# Patient Record
Sex: Female | Born: 1935
Health system: Southern US, Community
[De-identification: ages and names within clinical notes are randomized; demographics above are authoritative.]

## PROBLEM LIST (undated history)

## (undated) DIAGNOSIS — M1990 Unspecified osteoarthritis, unspecified site: Secondary | ICD-10-CM

## (undated) DIAGNOSIS — I10 Essential (primary) hypertension: Secondary | ICD-10-CM

## (undated) DIAGNOSIS — E079 Disorder of thyroid, unspecified: Secondary | ICD-10-CM

## (undated) DIAGNOSIS — I639 Cerebral infarction, unspecified: Secondary | ICD-10-CM

## (undated) DIAGNOSIS — N183 Chronic kidney disease, stage 3 unspecified: Secondary | ICD-10-CM

## (undated) DIAGNOSIS — Z9289 Personal history of other medical treatment: Secondary | ICD-10-CM

## (undated) HISTORY — DX: Essential (primary) hypertension: I10

## (undated) HISTORY — DX: Disorder of thyroid, unspecified: E07.9

## (undated) HISTORY — PX: REFRACTIVE SURGERY: SHX103

## (undated) HISTORY — DX: Personal history of other medical treatment: Z92.89

## (undated) HISTORY — PX: FINGER SURGERY: SHX640

## (undated) HISTORY — DX: Unspecified osteoarthritis, unspecified site: M19.90

---

## 1998-04-19 ENCOUNTER — Other Ambulatory Visit: Admission: RE | Admit: 1998-04-19 | Discharge: 1998-04-19 | Payer: Self-pay | Admitting: Gynecology

## 1999-05-21 ENCOUNTER — Other Ambulatory Visit: Admission: RE | Admit: 1999-05-21 | Discharge: 1999-05-21 | Payer: Self-pay | Admitting: Gynecology

## 2000-07-02 ENCOUNTER — Other Ambulatory Visit: Admission: RE | Admit: 2000-07-02 | Discharge: 2000-07-02 | Payer: Self-pay | Admitting: Gynecology

## 2001-08-10 ENCOUNTER — Other Ambulatory Visit: Admission: RE | Admit: 2001-08-10 | Discharge: 2001-08-10 | Payer: Self-pay | Admitting: Gynecology

## 2002-02-14 ENCOUNTER — Other Ambulatory Visit: Admission: RE | Admit: 2002-02-14 | Discharge: 2002-02-14 | Payer: Self-pay | Admitting: Gynecology

## 2002-11-01 ENCOUNTER — Other Ambulatory Visit: Admission: RE | Admit: 2002-11-01 | Discharge: 2002-11-01 | Payer: Self-pay | Admitting: Gynecology

## 2002-11-02 ENCOUNTER — Other Ambulatory Visit: Admission: RE | Admit: 2002-11-02 | Discharge: 2002-11-02 | Payer: Self-pay | Admitting: Gynecology

## 2003-05-08 ENCOUNTER — Other Ambulatory Visit: Admission: RE | Admit: 2003-05-08 | Discharge: 2003-05-08 | Payer: Self-pay | Admitting: Gynecology

## 2004-01-19 ENCOUNTER — Other Ambulatory Visit: Admission: RE | Admit: 2004-01-19 | Discharge: 2004-01-19 | Payer: Self-pay | Admitting: Gynecology

## 2005-04-21 ENCOUNTER — Other Ambulatory Visit: Admission: RE | Admit: 2005-04-21 | Discharge: 2005-04-21 | Payer: Self-pay | Admitting: Gynecology

## 2006-02-02 ENCOUNTER — Encounter: Admission: RE | Admit: 2006-02-02 | Discharge: 2006-02-02 | Payer: Self-pay | Admitting: Family Medicine

## 2006-04-15 ENCOUNTER — Encounter: Admission: RE | Admit: 2006-04-15 | Discharge: 2006-04-15 | Payer: Self-pay | Admitting: Endocrinology

## 2007-05-18 ENCOUNTER — Ambulatory Visit (HOSPITAL_COMMUNITY): Admission: RE | Admit: 2007-05-18 | Discharge: 2007-05-18 | Payer: Self-pay | Admitting: Gynecology

## 2007-06-28 ENCOUNTER — Encounter: Payer: Self-pay | Admitting: Family Medicine

## 2008-03-30 ENCOUNTER — Ambulatory Visit: Payer: Self-pay | Admitting: Ophthalmology

## 2008-03-30 ENCOUNTER — Other Ambulatory Visit: Payer: Self-pay

## 2008-04-05 ENCOUNTER — Ambulatory Visit: Payer: Self-pay | Admitting: Ophthalmology

## 2008-05-23 ENCOUNTER — Ambulatory Visit: Payer: Self-pay | Admitting: Ophthalmology

## 2008-06-07 ENCOUNTER — Ambulatory Visit: Payer: Self-pay | Admitting: Ophthalmology

## 2008-10-09 ENCOUNTER — Ambulatory Visit: Payer: Self-pay | Admitting: Family Medicine

## 2008-10-09 DIAGNOSIS — G4700 Insomnia, unspecified: Secondary | ICD-10-CM | POA: Insufficient documentation

## 2008-10-09 DIAGNOSIS — E039 Hypothyroidism, unspecified: Secondary | ICD-10-CM | POA: Insufficient documentation

## 2008-10-09 DIAGNOSIS — I10 Essential (primary) hypertension: Secondary | ICD-10-CM | POA: Insufficient documentation

## 2009-04-18 ENCOUNTER — Telehealth: Payer: Self-pay | Admitting: Family Medicine

## 2009-10-08 ENCOUNTER — Ambulatory Visit: Payer: Self-pay | Admitting: Specialist

## 2009-10-15 ENCOUNTER — Ambulatory Visit: Payer: Self-pay | Admitting: Specialist

## 2009-10-23 ENCOUNTER — Ambulatory Visit: Payer: Self-pay | Admitting: Specialist

## 2009-10-23 ENCOUNTER — Encounter: Payer: Self-pay | Admitting: Family Medicine

## 2009-11-14 ENCOUNTER — Telehealth: Payer: Self-pay | Admitting: Family Medicine

## 2009-11-22 ENCOUNTER — Ambulatory Visit: Payer: Self-pay | Admitting: Family Medicine

## 2009-11-22 DIAGNOSIS — R5383 Other fatigue: Secondary | ICD-10-CM | POA: Insufficient documentation

## 2009-11-22 DIAGNOSIS — R5381 Other malaise: Secondary | ICD-10-CM | POA: Insufficient documentation

## 2009-12-20 ENCOUNTER — Ambulatory Visit: Payer: Self-pay | Admitting: Family Medicine

## 2010-03-27 ENCOUNTER — Ambulatory Visit: Payer: Self-pay | Admitting: Family Medicine

## 2010-08-29 NOTE — Progress Notes (Signed)
Summary: Pt req partial refill of Amlodipine. Has ov on 11/22/09  Phone Note Call from Patient Call back at [phone] cell  or [phone]   Caller: Patient Summary of Call: Pt has sch an ov for 11/22/09 at 11:00 am.  Pt is req a partial refill of Amlodipine to last until appt. Pt only has 2 pills lft. [facility] [address]  [phone]. Initial call taken by: Braulio Bosch,  November 14, 2009 11:18 AM  Follow-up for Phone Call        Pt informed #30 sent to pharmacy Follow-up by: Nira Conn LPN,  April 20, 624THL 1:51 PM    Prescriptions: AMLODIPINE BESYLATE 5 MG TABS (AMLODIPINE BESYLATE) one by mouth once daily  #30 x 0   Entered by:   Nira Conn LPN   Authorized by:   Carolann Littler MD   Signed by:   Nira Conn LPN on [id number]   Method used:   [facility]        [facility]       [address]       [place], [place]  [address]       Ph: [phone] or [phone]       Fax: [phone]   RxID:   [id number]

## 2010-08-29 NOTE — Assessment & Plan Note (Signed)
Summary: ROA/RCD   Vital Signs:  Patient profile:   75 year old female Menstrual status:  postmenopausal Height:      61.5 inches Weight:      146 pounds BMI:     27.24 Temp:     98.3 degrees F oral BP sitting:   142 / 82  (left arm) Cuff size:   regular  Vitals Entered By: Nira Conn LPN (March 15, 624THL 579FGE PM) Is Patient Diabetic? No  years   days  Menstrual Status postmenopausal   History of Present Illness: Patient is 75 year old female seen to establish care. She has history of hypertension which is treated with hctz 12.5 mg daily and amlodipine 5 mg daily. She also has history of hypothyroidism treated with Synthroid she sees an endocrinologist for that. She has osteoporosis treated with Fosamax.  She has a history of mild osteoarthritis but remains quite active physically. She needs refills of her amlodipine today. She's not had any recent chest pain, shortness of breath, dizziness, or syncopal episodes.  She complains of some difficulty with sleeping generally 4-5 hours per night. No daytime naps. No recent decongestant use and no alcohol use. She does drink a lot of tea sometimes as late as 5 or 6 PM at night. She had some associated urine frequency and urgency he was placed on oxybutynin by her GYN but had excessive dry mouth and stopped this.  Preventive Screening-Counseling & Management     Smoking Status: never  Allergies (verified): No Known Drug Allergies  Past History:  Past Medical History:    Hypertension    Arthritis    Blood transfusion 1960  Family History:    Family History of Arthritis  parent    Family History Hypertension  parent    Family History of Stroke F 1st degree relative <60  parent    Family History of Cardiovascular disorder  parent father 34  Social History:    Retired Optometrist, now Horticulturist, commercial    Married    Never Smoked    Alcohol use-no    Smoking Status:  never  Review of Systems  The patient denies weight  loss, weight gain, chest pain, syncope, dyspnea on exertion, peripheral edema, and prolonged cough.    Physical Exam  General:  Alert pleasant cooperative 75 year old female Head:  Normocephalic and atraumatic without obvious abnormalities. No apparent alopecia or balding. Mouth:  Oral mucosa and oropharynx without lesions or exudates.  Teeth in good repair. Neck:  no masses or adenopathy noted Lungs:  chest clear to auscultation Heart:  heart regular rhythm and rate Extremities:  no edema or clubbing noted   Impression & Recommendations:  Problem # 1:  HYPERTENSION (ICD-401.9) Stable by home readings. Refill on amlodipine for one year. Routine followup in 6 months. Her updated medication list for this problem includes:    Hydrochlorothiazide 25 Mg Tabs (Hydrochlorothiazide) .Marland Kitchen... 1/2 tab daily    Amlodipine Besylate 5 Mg Tabs (Amlodipine besylate) ..... Once daily    Amlodipine Besylate 5 Mg Tabs (Amlodipine besylate) ..... One by mouth once daily  Problem # 2:  INSOMNIA-SLEEP DISORDER-UNSPEC (ICD-780.52) Issues of sleep hygiene were discussed extensively. She should scale her caffeine back no later than 2 PM. Avoid daytime naps.  Problem # 3:  HYPOTHYROIDISM (ICD-244.9)  Her updated medication list for this problem includes:    Synthroid 112 Mcg Tabs (Levothyroxine sodium) ..... Once daily  Complete Medication List: 1)  Synthroid 112 Mcg Tabs (Levothyroxine sodium) .Marland KitchenMarland KitchenMarland Kitchen  Once daily 2)  Alendronate Sodium 70 Mg Tabs (Alendronate sodium) .... Once daily 3)  Hydrochlorothiazide 25 Mg Tabs (Hydrochlorothiazide) .... 1/2 tab daily 4)  Amlodipine Besylate 5 Mg Tabs (Amlodipine besylate) .... Once daily 5)  Amlodipine Besylate 5 Mg Tabs (Amlodipine besylate) .... One by mouth once daily  Patient Instructions: 1)  Please schedule a follow-up appointment in 6 months .  2)  Limit your Sodium(salt) to less than 2 grams a day (slightly less than 1/2 teaspoon) to prevent fluid retention,  swelling, or worsening of symptoms .  3)  The medication list was reviewed and reconciled.  All changed / newly prescribed medications were explained.  A complete medication list was provided to the patient / caregiver. Prescriptions: AMLODIPINE BESYLATE 5 MG TABS (AMLODIPINE BESYLATE) one by mouth once daily  #30 x 11   Entered and Authorized by:   Carolann Littler MD   Signed by:   Carolann Littler MD on 10/09/2008   Method used:   [facility]        [id number]       [address]       [place], [place]  [address]       Ph: [phone] or [phone]       Fax: [phone]   RxID:   [id number]

## 2010-08-29 NOTE — Assessment & Plan Note (Signed)
Summary: 1 month rov/njr   Vital Signs:  Patient profile:   75 year old female Menstrual status:  postmenopausal Temp:     98 degrees F BP sitting:   158 / 80  (left arm) Cuff size:   regular  Vitals Entered By: Nira Conn LPN (May 26, 624THL [id number] PM)  Serial Vital Signs/Assessments:  Time      Position  BP       Pulse  Resp  Temp     By                     146/78                         Carolann Littler MD  CC: One month follow-up   History of Present Illness: F/U hypertension. Question of whitecoat syndrome. Home blood pressures reviewed and consistently good readings. Denies any orthostatic symptoms, chest pains, or palpitations.  Allergies: No Known Drug Allergies  Past History:  Past Medical History: Last updated: 10/09/2008 Hypertension Arthritis Blood transfusion 1960  Review of Systems  The patient denies weight loss, weight gain, chest pain, syncope, dyspnea on exertion, peripheral edema, prolonged cough, and headaches.    Physical Exam  General:  Well-developed,well-nourished,in no acute distress; alert,appropriate and cooperative throughout examination Neck:  No deformities, masses, or tenderness noted. Lungs:  Normal respiratory effort, chest expands symmetrically. Lungs are clear to auscultation, no crackles or wheezes. Heart:  Normal rate and regular rhythm. S1 and S2 normal without gallop, murmur, click, rub or other extra sounds.   Impression & Recommendations:  Problem # 1:  HYPERTENSION (ICD-401.9) home cuff readings here do not match with ours and she will get new home cuff. cont current meds and reassess 3 months. Her updated medication list for this problem includes:    Hydrochlorothiazide 25 Mg Tabs (Hydrochlorothiazide) .Marland Kitchen... 1/2 tab daily    Amlodipine Besylate 5 Mg Tabs (Amlodipine besylate) ..... One by mouth once daily  Complete Medication List: 1)  Synthroid 100 Mcg Tabs (Levothyroxine sodium) .... Once daily 2)  Alendronate Sodium  70 Mg Tabs (Alendronate sodium) .... Once daily 3)  Hydrochlorothiazide 25 Mg Tabs (Hydrochlorothiazide) .... 1/2 tab daily 4)  Amlodipine Besylate 5 Mg Tabs (Amlodipine besylate) .... One by mouth once daily 5)  Aspirin Ec Low Strength 81 Mg Tbec (Aspirin) .... Once daily  Patient Instructions: 1)  Please schedule a follow-up appointment in 3 months .  2)  Check your  Blood Pressure regularly . If it is above: 140/90  you should make an appointment. 3)  Limit your Sodium(salt) .

## 2010-08-29 NOTE — Assessment & Plan Note (Signed)
Summary: med check/refill/cjr/pt rsc/cjr   Vital Signs:  Patient profile:   75 year old female Menstrual status:  postmenopausal Height:      61.50 inches Weight:      147.50 pounds BMI:     27.52 Temp:     98.7 degrees F oral Pulse rate:   72 / minute Pulse rhythm:   regular Resp:     12 per minute BP sitting:   150 / 80  (left arm) Cuff size:   regular  Vitals Entered By: Nira Conn LPN (April 28, 624THL 624THL AM)  Serial Vital Signs/Assessments:  Time      Position  BP       Pulse  Resp  Temp     By                     168/80                         Carolann Littler MD  CC: med check, refills needed, Hypertension Management   History of Present Illness: followup hypertension. Medications reviewed. Compliant with therapy. Not monitoring blood pressure regularly at home.  Hypothyroidism followed by endocrinologist.  History of osteoporosis. Takes Fosamax regularly. Continues to see GYN regarding that.  Complains of some nonspecific fatigue which she thinks might be related to stress of care for her debilitated husband.  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  Sleep OK.  Denies depression.  Recent TSH was normal.   Eats freq "on the run" with several processed foods.  Hypertension History:      She denies headache, chest pain, palpitations, orthopnea, PND, peripheral edema, visual symptoms, neurologic problems, syncope, and side effects from treatment.  She notes no problems with any antihypertensive medication side effects.  Further comments include: Occ mild dyspnea with exertion but no chest pain.        Positive major cardiovascular risk factors include female age 73 years old or older and hypertension.  Negative major cardiovascular risk factors include non-tobacco-user status.     Allergies (verified): No Known Drug Allergies  Past History:  Past Medical History: Last updated: 10/09/2008 Hypertension Arthritis Blood  transfusion 1960  Social History: Last updated: 10/09/2008 Retired Optometrist, now self employed-taxes Married Never Smoked Alcohol use-no  Review of Systems       The patient complains of weight gain.  The patient denies anorexia, fever, weight loss, chest pain, syncope, peripheral edema, prolonged cough, headaches, hemoptysis, abdominal pain, melena, hematochezia, severe indigestion/heartburn, muscle weakness, and depression.    Physical Exam  General:  Well-developed,well-nourished,in no acute distress; alert,appropriate and cooperative throughout examination Head:  Normocephalic and atraumatic without obvious abnormalities. No apparent alopecia or balding. Eyes:  pupils equal, pupils round, and pupils reactive to light.   Ears:  External ear exam shows no significant lesions or deformities.  Otoscopic examination reveals clear canals, tympanic membranes are intact bilaterally without bulging, retraction, inflammation or discharge. Hearing is grossly normal bilaterally. Mouth:  Oral mucosa and oropharynx without lesions or exudates.  Teeth in good repair. Neck:  No deformities, masses, or tenderness noted. Lungs:  Normal respiratory effort, chest expands symmetrically. Lungs are clear to auscultation, no crackles or wheezes. Heart:  normal rate, regular rhythm, and no gallop.   Extremities:  no edema Neurologic:  alert & oriented X3, cranial nerves II-XII intact, and strength normal in all extremities.   Cervical Nodes:  No  lymphadenopathy noted Psych:  Oriented X3, normally interactive, good eye contact, not anxious appearing, and not depressed appearing.     Impression & Recommendations:  Problem # 1:  HYPERTENSION (ICD-401.9) Assessment Deteriorated Recheck within 3-4 weeks and pt to bring her home cuff with log of readings. The following medications were removed from the medication list:    Amlodipine Besylate 5 Mg Tabs (Amlodipine besylate) ..... Once daily Her updated  medication list for this problem includes:    Hydrochlorothiazide 25 Mg Tabs (Hydrochlorothiazide) .Marland Kitchen... 1/2 tab daily    Amlodipine Besylate 5 Mg Tabs (Amlodipine besylate) ..... One by mouth once daily  Orders: Prescription Created Electronically [phone])  Problem # 2:  HYPOTHYROIDISM (ICD-244.9)  Her updated medication list for this problem includes:    Synthroid 100 Mcg Tabs (Levothyroxine sodium) ..... Once daily  Problem # 3:  FATIGUE (ICD-780.79) Assessment: New prob related to stress.  Dietary issues discussed.  Complete Medication List: 1)  Synthroid 100 Mcg Tabs (Levothyroxine sodium) .... Once daily 2)  Alendronate Sodium 70 Mg Tabs (Alendronate sodium) .... Once daily 3)  Hydrochlorothiazide 25 Mg Tabs (Hydrochlorothiazide) .... 1/2 tab daily 4)  Amlodipine Besylate 5 Mg Tabs (Amlodipine besylate) .... One by mouth once daily 5)  Aspirin Ec Low Strength 81 Mg Tbec (Aspirin) .... Once daily  Hypertension Assessment/Plan:      The patient's hypertensive risk group is category B: At least one risk factor (excluding diabetes) with no target organ damage.  Today's blood pressure is 150/80.    Patient Instructions: 1)  Please schedule a follow-up appointment in 1 month.  2)  Limit your Sodium(salt) .  3)  Bring home blood pressure monitor with you when you return for follow up. Prescriptions: AMLODIPINE BESYLATE 5 MG TABS (AMLODIPINE BESYLATE) one by mouth once daily  #30 x 6   Entered and Authorized by:   Carolann Littler MD   Signed by:   Carolann Littler MD on 11/22/2009   Method used:   [facility]        [id number]       [address]       [place], [place]  [address]       Ph: [phone] or [phone]       Fax: [phone]   RxID:   [id number] HYDROCHLOROTHIAZIDE 25 MG TABS (HYDROCHLOROTHIAZIDE) 1/2 tab daily  #30 x 6   Entered and Authorized by:   Carolann Littler MD   Signed by:   Carolann Littler MD on 11/22/2009   Method used:    [facility]        [id number]       [address]       [place], [place]  [address]       Ph: [phone] or [phone]       Fax: [phone]   RxID:   [id number]

## 2010-08-29 NOTE — Assessment & Plan Note (Signed)
Summary: 3 month follow up/cjr/pt rsc/cjr   Vital Signs:  Patient profile:   75 year old female Menstrual status:  postmenopausal Weight:      147 pounds Temp:     98 degrees F oral BP sitting:   138 / 92  (left arm) Cuff size:   regular  Vitals Entered By: Nira Conn LPN (August 31, 624THL 3:21 PM)  History of Present Illness: Patient's for followup hypertension. Blood pressure readings have been stable recently at home. Compliant with medications. No side effects. Patient has obtained new blood pressure monitor since last visit.  she remains quite active with physical activities around her house. 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  thyroid monitored by endocrinologist  Hypertension History:      She denies headache, chest pain, palpitations, dyspnea with exertion, orthopnea, PND, peripheral edema, visual symptoms, neurologic problems, syncope, and side effects from treatment.  She notes no problems with any antihypertensive medication side effects.        Positive major cardiovascular risk factors include female age 32 years old or older and hypertension.  Negative major cardiovascular risk factors include non-tobacco-user status.     Allergies (verified): No Known Drug Allergies  Past History:  Past Medical History: Last updated: 10/09/2008 Hypertension Arthritis Blood transfusion 1960 PMH reviewed for relevance  Review of Systems      See HPI  Physical Exam  General:  Well-developed,well-nourished,in no acute distress; alert,appropriate and cooperative throughout examination Mouth:  Oral mucosa and oropharynx without lesions or exudates.  Teeth in good repair. Neck:  No deformities, masses, or tenderness noted. Lungs:  Normal respiratory effort, chest expands symmetrically. Lungs are clear to auscultation, no crackles or wheezes. Heart:  Normal rate and regular rhythm. S1 and S2 normal without gallop, murmur, click, rub or  other extra sounds.   Impression & Recommendations:  Problem # 1:  HYPERTENSION (ICD-401.9) Assessment Unchanged adequate control by home readings Her updated medication list for this problem includes:    Hydrochlorothiazide 25 Mg Tabs (Hydrochlorothiazide) .Marland Kitchen... 1/2 tab daily    Amlodipine Besylate 5 Mg Tabs (Amlodipine besylate) ..... One by mouth once daily  Complete Medication List: 1)  Synthroid 100 Mcg Tabs (Levothyroxine sodium) .... Once daily 2)  Alendronate Sodium 70 Mg Tabs (Alendronate sodium) .... Once daily 3)  Hydrochlorothiazide 25 Mg Tabs (Hydrochlorothiazide) .... 1/2 tab daily 4)  Amlodipine Besylate 5 Mg Tabs (Amlodipine besylate) .... One by mouth once daily 5)  Aspirin Ec Low Strength 81 Mg Tbec (Aspirin) .... Once daily  Hypertension Assessment/Plan:      The patient's hypertensive risk group is category B: At least one risk factor (excluding diabetes) with no target organ damage.  Today's blood pressure is 138/92.    Patient Instructions: 1)  Check your  Blood Pressure regularly . If it is above:140/90  you should make an appointment. 2)  Please schedule a follow-up appointment in 6 months .

## 2010-08-29 NOTE — Letter (Signed)
Summary: Records from [facility] 2007 - 2008  Records from [facility] 2007 - 2008   Imported By: Laural Benes 11/13/2008 15:14:12  _____________________________________________________________________  External Attachment:    Type:   Image     Comment:   External Document

## 2010-08-29 NOTE — Progress Notes (Signed)
Summary: Vein Referral Order Request  Phone Note From Other Clinic   Caller: Patient Caller: [facility] [place] Call For: Paula Haynes Summary of Call: [name] from [facility] [place] calling on behalf of pt requesting a referral.  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 Her insurance has changed and new insurance requires a referral  for treatment.   Fax order/referral to [id number] Phone is [phone] Initial call taken by: Nira Conn LPN,  September 22, 624THL 10:16 AM  Follow-up for Phone Call        order written. Follow-up by: Carolann Littler MD,  April 18, 2009 10:39 AM  Additional Follow-up for Phone Call Additional follow up Details #1::        Rx faxed, confirmation received Additional Follow-up by: Nira Conn LPN,  September 22, 624THL 11:24 AM

## 2010-09-07 ENCOUNTER — Other Ambulatory Visit: Payer: Self-pay | Admitting: Family Medicine

## 2010-09-07 DIAGNOSIS — I10 Essential (primary) hypertension: Secondary | ICD-10-CM

## 2010-10-15 ENCOUNTER — Encounter: Payer: Self-pay | Admitting: Family Medicine

## 2010-10-16 ENCOUNTER — Ambulatory Visit (INDEPENDENT_AMBULATORY_CARE_PROVIDER_SITE_OTHER): Payer: PRIVATE HEALTH INSURANCE | Admitting: Family Medicine

## 2010-10-16 ENCOUNTER — Encounter: Payer: Self-pay | Admitting: Family Medicine

## 2010-10-16 VITALS — BP 140/82 | Temp 98.3°F | Ht 61.25 in | Wt 147.0 lb

## 2010-10-16 DIAGNOSIS — R5381 Other malaise: Secondary | ICD-10-CM

## 2010-10-16 DIAGNOSIS — I10 Essential (primary) hypertension: Secondary | ICD-10-CM

## 2010-10-16 DIAGNOSIS — R5383 Other fatigue: Secondary | ICD-10-CM

## 2010-10-16 MED ORDER — HYDROCHLOROTHIAZIDE 12.5 MG PO CAPS: 12.5000 mg | ORAL_CAPSULE | ORAL | Status: DC

## 2010-10-16 NOTE — Progress Notes (Signed)
  Subjective:    Patient ID: Paula Haynes, female    DOB: [birth year], 75 y.o.   MRN: [id number]  HPI  Here for follow up hypertension.  Meds reviewed.  Compliant with all.  No side effects.  Very busy caring for demented husband.  Increased stress levels.  No chest pain or dizziness.  Hypothyroidism followed by endocrinologist.  No weight changes.  Review of Systems  Constitutional: Negative for fatigue.  Eyes: Negative for visual disturbance.  Respiratory: Negative for cough, chest tightness, shortness of breath and wheezing.   Cardiovascular: Negative for chest pain, palpitations and leg swelling.  Neurological: Negative for dizziness, seizures, syncope, weakness, light-headedness and headaches.       Objective:   Physical Exam  Constitutional: She is oriented to person, place, and time. She appears well-developed and well-nourished.  HENT:  Head: Normocephalic and atraumatic.  Eyes: Conjunctivae are normal. Pupils are equal, round, and reactive to light.  Neck: Neck supple. No thyromegaly present.  Cardiovascular: Normal rate and regular rhythm.  Exam reveals no gallop.   Pulmonary/Chest: Effort normal and breath sounds normal. No respiratory distress. She has no rales.  Musculoskeletal: She exhibits no edema.  Lymphadenopathy:    She has no cervical adenopathy.  Neurological: She is alert and oriented to person, place, and time. No cranial nerve deficit.  Skin: No rash noted.  Psychiatric: She has a normal mood and affect.          Assessment & Plan:  Hypertension.  Stable.  Refilled meds.  Routine follow up in 6 months.

## 2010-12-04 ENCOUNTER — Other Ambulatory Visit: Payer: Self-pay | Admitting: Family Medicine

## 2011-04-25 ENCOUNTER — Other Ambulatory Visit: Payer: Self-pay | Admitting: Family Medicine

## 2011-05-02 ENCOUNTER — Ambulatory Visit (INDEPENDENT_AMBULATORY_CARE_PROVIDER_SITE_OTHER): Payer: PRIVATE HEALTH INSURANCE | Admitting: Family Medicine

## 2011-05-02 DIAGNOSIS — Z23 Encounter for immunization: Secondary | ICD-10-CM

## 2011-05-22 ENCOUNTER — Other Ambulatory Visit: Payer: Self-pay | Admitting: Family Medicine

## 2011-07-05 ENCOUNTER — Other Ambulatory Visit: Payer: Self-pay | Admitting: Family Medicine

## 2011-11-03 ENCOUNTER — Other Ambulatory Visit: Payer: Self-pay | Admitting: Family Medicine

## 2011-11-09 ENCOUNTER — Other Ambulatory Visit: Payer: Self-pay | Admitting: Family Medicine

## 2011-12-03 ENCOUNTER — Other Ambulatory Visit: Payer: Self-pay | Admitting: Family Medicine

## 2011-12-31 ENCOUNTER — Other Ambulatory Visit: Payer: Self-pay | Admitting: Family Medicine

## 2012-01-02 ENCOUNTER — Ambulatory Visit: Payer: PRIVATE HEALTH INSURANCE | Admitting: Family Medicine

## 2012-01-02 ENCOUNTER — Other Ambulatory Visit: Payer: Self-pay | Admitting: *Deleted

## 2012-01-02 MED ORDER — AMLODIPINE BESYLATE 5 MG PO TABS: 5.0000 mg | ORAL_TABLET | Freq: Every day | ORAL | Status: DC

## 2012-01-02 MED ORDER — HYDROCHLOROTHIAZIDE 12.5 MG PO CAPS: 12.5000 mg | ORAL_CAPSULE | Freq: Every day | ORAL | Status: DC

## 2012-01-22 ENCOUNTER — Encounter: Payer: Self-pay | Admitting: Family Medicine

## 2012-01-22 ENCOUNTER — Ambulatory Visit (INDEPENDENT_AMBULATORY_CARE_PROVIDER_SITE_OTHER): Payer: Medicare Other | Admitting: Family Medicine

## 2012-01-22 VITALS — BP 140/78 | HR 72 | Temp 98.0°F | Resp 12 | Ht 61.5 in | Wt 140.0 lb

## 2012-01-22 DIAGNOSIS — Z0000 Encounter for general adult medical examination without abnormal findings: Secondary | ICD-10-CM

## 2012-01-22 DIAGNOSIS — I10 Essential (primary) hypertension: Secondary | ICD-10-CM

## 2012-01-22 DIAGNOSIS — Z23 Encounter for immunization: Secondary | ICD-10-CM

## 2012-01-22 DIAGNOSIS — E039 Hypothyroidism, unspecified: Secondary | ICD-10-CM

## 2012-01-22 LAB — BASIC METABOLIC PANEL
BUN: 29 mg/dL — ABNORMAL HIGH (ref 6–23)
CO2: 28 mEq/L (ref 19–32)
Calcium: 9.7 mg/dL (ref 8.4–10.5)
Chloride: 103 mEq/L (ref 96–112)
Creatinine, Ser: 1.4 mg/dL — ABNORMAL HIGH (ref 0.4–1.2)
GFR: 39.34 mL/min — ABNORMAL LOW (ref >60.00)
Glucose, Bld: 91 mg/dL (ref 70–99)
Potassium: 3.9 mEq/L (ref 3.5–5.1)
Sodium: 139 mEq/L (ref 135–145)

## 2012-01-22 MED ORDER — ZOSTER VACCINE LIVE 19400 UNT/0.65ML ~~LOC~~ SOLR: 0.6500 mL | Freq: Once | SUBCUTANEOUS | Status: DC

## 2012-01-22 NOTE — Patient Instructions (Addendum)
We will call you with an appointment for a colonoscopy Continue yearly flu vaccine. Continue regular supplementation with vitamin D and calcium. Continue yearly mammograms

## 2012-01-22 NOTE — Progress Notes (Signed)
Subjective:    Patient ID: Paula Haynes, female    DOB: [birth year], 76 y.o.   MRN: [id number]  HPI  Patient here for Medicare wellness exam and medical followup. Her husband has weight recently following stroke. Overall she is coping fairly well. She has several supportive friends stays busy with several activities. She recently had [employer] screening but no copy of final report yet. No reported history of shingles vaccine. No known Pneumovax. No history of colonoscopy and she would like to consider scheduling. She gets yearly flu vaccine.  Chronic problems include hypothyroidism followed by endocrinology. She has hypertension treated with amlodipine and HCTZ.  Nonsmoker. No alcohol use. Low risk for falls. No major depressive issues.  Past Medical History  Diagnosis Date  . Hypertension   . Arthritis    No past surgical history on file.  reports that she has never smoked. She does not have any smokeless tobacco history on file. She reports that she does not drink alcohol. Her drug history not on file. family history includes Arthritis in an unspecified family member; Heart disease in her father; Hypertension in an unspecified family member; and Stroke in an unspecified family member. No Known Allergies  1.  Risk factors based on Past Medical , Social, and Family history  Reviewed and as above. 2.  Limitations in physical activities very active.  Low risk of falls. 3.  Depression/mood no depression issues. 4.  Hearing no impairment. 5.  ADLs fully independent. 6.  Cognitive function (orientation to time and place, language, writing, speech,memory) No memory deficits.  Language and judgement intact. 7.  Home Safety no issues 8.  Height, weight, and visual acuity. All stable. 9.  Counseling discussed coping with loss of spouse.   10. Recommendation of preventive services. Pneumovax. Set up colonoscopy.  Check on shingles vaccine coverage. 11. Labs based on risk factors BMP (diuretic  use). 12. Care Plan As above.    Review of Systems  Constitutional: Negative for fever, activity change, appetite change, fatigue and unexpected weight change.  HENT: Negative for hearing loss, ear pain, sore throat and trouble swallowing.   Eyes: Negative for visual disturbance.  Respiratory: Negative for cough and shortness of breath.   Cardiovascular: Negative for chest pain and palpitations.  Gastrointestinal: Negative for abdominal pain, diarrhea, constipation and blood in stool.  Genitourinary: Negative for dysuria and hematuria.  Musculoskeletal: Negative for myalgias, back pain and arthralgias.  Skin: Negative for rash.  Neurological: Negative for dizziness, syncope and headaches.  Hematological: Negative for adenopathy.  Psychiatric/Behavioral: Negative for confusion and dysphoric mood.       Objective:   Physical Exam  Constitutional: She is oriented to person, place, and time. She appears well-developed and well-nourished.  HENT:  Head: Normocephalic and atraumatic.  Eyes: EOM are normal. Pupils are equal, round, and reactive to light.  Neck: Normal range of motion. Neck supple. No thyromegaly present.  Cardiovascular: Normal rate, regular rhythm and normal heart sounds.   No murmur heard. Pulmonary/Chest: Breath sounds normal. No respiratory distress. She has no wheezes. She has no rales.  Abdominal: Soft. Bowel sounds are normal. She exhibits no distension and no mass. There is no tenderness. There is no rebound and no guarding.  Genitourinary:       Per GYN  Musculoskeletal: Normal range of motion. She exhibits no edema.  Lymphadenopathy:    She has no cervical adenopathy.  Neurological: She is alert and oriented to person, place, and time. She displays normal reflexes.  No cranial nerve deficit.  Skin: No rash noted.  Psychiatric: She has a normal mood and affect. Her behavior is normal. Judgment and thought content normal.          Assessment & Plan:    #1complete physical. Pneumovax given. Continue yearly flu vaccine. Check on coverage shingles vaccine. Set up colonoscopy. Continue regular GYN followup and mammograms. She reportedly had recent bone density scan through GYN #2 hypertension. Stable. Continue current medications. Check basic metabolic panel #3 hypothyroidism. Followed by endocrinology.

## 2012-01-23 NOTE — Progress Notes (Signed)
Quick Note:  Pt informed on VM ______ 

## 2012-02-02 ENCOUNTER — Encounter: Payer: Self-pay | Admitting: Internal Medicine

## 2012-02-10 ENCOUNTER — Ambulatory Visit (AMBULATORY_SURGERY_CENTER): Payer: Medicare Other

## 2012-02-10 VITALS — Ht 62.0 in | Wt 139.8 lb

## 2012-02-10 DIAGNOSIS — Z1211 Encounter for screening for malignant neoplasm of colon: Secondary | ICD-10-CM

## 2012-02-10 MED ORDER — MOVIPREP 100 G PO SOLR: ORAL | Status: DC

## 2012-02-11 ENCOUNTER — Encounter: Payer: Self-pay | Admitting: Internal Medicine

## 2012-02-24 ENCOUNTER — Encounter: Payer: Self-pay | Admitting: Internal Medicine

## 2012-02-24 ENCOUNTER — Other Ambulatory Visit: Payer: Self-pay | Admitting: Family Medicine

## 2012-02-24 ENCOUNTER — Ambulatory Visit (AMBULATORY_SURGERY_CENTER): Payer: Medicare Other | Admitting: Internal Medicine

## 2012-02-24 VITALS — BP 146/86 | HR 88 | Temp 96.2°F | Resp 20 | Ht 62.0 in | Wt 139.0 lb

## 2012-02-24 DIAGNOSIS — K5730 Diverticulosis of large intestine without perforation or abscess without bleeding: Secondary | ICD-10-CM

## 2012-02-24 DIAGNOSIS — Z1211 Encounter for screening for malignant neoplasm of colon: Secondary | ICD-10-CM

## 2012-02-24 MED ORDER — SODIUM CHLORIDE 0.9 % IV SOLN: 500.0000 mL | INTRAVENOUS | Status: DC

## 2012-02-24 NOTE — Progress Notes (Signed)
Patient did not experience any of the following events: a burn prior to discharge; a fall within the facility; wrong site/side/patient/procedure/implant event; or a hospital transfer or hospital admission upon discharge from the facility. (G8907) Patient did not have preoperative order for IV antibiotic SSI prophylaxis. (G8918)  

## 2012-02-24 NOTE — Progress Notes (Signed)
Propofol given and oxygen managed per S [place] CRNA 

## 2012-02-24 NOTE — Patient Instructions (Addendum)
You have diverticulosis of the colon - pockets and thickened muscles. Essentially a normal finding for your age.  No polyps or cancer seen.  This is your first and last routine colonoscopy!  Thank you for choosing me and [facility].  Gatha Mayer, MD, FACG  YOU HAD AN ENDOSCOPIC PROCEDURE TODAY AT [facility]: Refer to the procedure report that was given to you for any specific questions about what was found during the examination.  If the procedure report does not answer your questions, please call your gastroenterologist to clarify.  If you requested that your care partner not be given the details of your procedure findings, then the procedure report has been included in a sealed envelope for you to review at your convenience later.  YOU SHOULD EXPECT: Some feelings of bloating in the abdomen. Passage of more gas than usual.  Walking can help get rid of the air that was put into your GI tract during the procedure and reduce the bloating. If you had a lower endoscopy (such as a colonoscopy or flexible sigmoidoscopy) you may notice spotting of blood in your stool or on the toilet paper. If you underwent a bowel prep for your procedure, then you may not have a normal bowel movement for a few days.  DIET: Your first meal following the procedure should be a light meal and then it is ok to progress to your normal diet.  A half-sandwich or bowl of soup is an example of a good first meal.  Heavy or fried foods are harder to digest and may make you feel nauseous or bloated.  Likewise meals heavy in dairy and vegetables can cause extra gas to form and this can also increase the bloating.  Drink plenty of fluids but you should avoid alcoholic beverages for 24 hours.  ACTIVITY: Your care partner should take you home directly after the procedure.  You should plan to take it easy, moving slowly for the rest of the day.  You can resume normal activity the day after the procedure  however you should NOT DRIVE or use heavy machinery for 24 hours (because of the sedation medicines used during the test).    SYMPTOMS TO REPORT IMMEDIATELY: A gastroenterologist can be reached at any hour.  During normal business hours, 8:30 AM to 5:00 PM Monday through Friday, call [phone].  After hours and on weekends, please call the GI answering service at [phone] who will take a message and have the physician on call contact you.   Following lower endoscopy (colonoscopy or flexible sigmoidoscopy):  Excessive amounts of blood in the stool  Significant tenderness or worsening of abdominal pains  Swelling of the abdomen that is new, acute  Fever of 100F or higher  FOLLOW UP: If any biopsies were taken you will be contacted by phone or by letter within the next 1-3 weeks.  Call your gastroenterologist if you have not heard about the biopsies in 3 weeks.  Our staff will call the home number listed on your records the next business day following your procedure to check on you and address any questions or concerns that you may have at that time regarding the information given to you following your procedure. This is a courtesy call and so if there is no answer at the home number and we have not heard from you through the emergency physician on call, we will assume that you have returned to your regular daily activities without incident.  SIGNATURES/CONFIDENTIALITY: You and/or your care partner have signed paperwork which will be entered into your electronic medical record.  These signatures attest to the fact that that the information above on your After Visit Summary has been reviewed and is understood.  Full responsibility of the confidentiality of this discharge information lies with you and/or your care-partner.

## 2012-02-24 NOTE — Op Note (Signed)
[facility] [address] [place], [place]  [address]  COLONOSCOPY PROCEDURE REPORT  PATIENT:  Paula Haynes, Paula Haynes  MR#:  [id number] BIRTHDATE:  1935/12/23, 76 yrs. old  GENDER:  female ENDOSCOPIST:  Gatha Mayer, MD, [facility] REF. BY:  Carolann Littler, M.D. PROCEDURE DATE:  02/24/2012 PROCEDURE:  Colonoscopy [id number] ASA CLASS:  Class II INDICATIONS:  Routine Risk Screening MEDICATIONS:   These medications were titrated to patient response per physician's verbal order, MAC sedation, administered by CRNA, propofol (Diprivan) 150 mg IV  DESCRIPTION OF PROCEDURE:   After the risks benefits and alternatives of the procedure were thoroughly explained, informed consent was obtained.  Digital rectal exam was performed and revealed no abnormalities.   The LB [id number] endoscope was introduced through the anus and advanced to the cecum, which was identified by both the appendix and ileocecal valve, without limitations.  The quality of the prep was excellent, using MoviPrep.  The instrument was then slowly withdrawn as the colon was fully examined. <<PROCEDUREIMAGES>>  FINDINGS:  Severe diverticulosis was found in the sigmoid colon. This was otherwise a normal examination of the colon.   Retroflexed views in the rectum revealed no abnormalities.    The time to cecum = 3:02 minutes. The scope was then withdrawn in 8:16 minutes from the cecum and the procedure completed. COMPLICATIONS:  None ENDOSCOPIC IMPRESSION: 1) Severe diverticulosis in the sigmoid colon 2) Otherwise normal examination, excellent prep  Gatha Mayer, MD, [facility]  CC:  Carolann Littler, MD and The Patient  n. eSIGNED:   Gatha Mayer at 02/24/2012 02:04 PM  Durenda Age, [id number]

## 2012-02-25 ENCOUNTER — Telehealth: Payer: Self-pay | Admitting: *Deleted

## 2012-02-25 NOTE — Telephone Encounter (Signed)
  Follow up Call-  Call back number 02/24/2012  Post procedure Call Back phone  # [phone]  Permission to leave phone message Yes     [facility]

## 2012-07-28 LAB — HM COLONOSCOPY: HM Colonoscopy: NORMAL

## 2012-11-11 ENCOUNTER — Other Ambulatory Visit: Payer: Self-pay | Admitting: *Deleted

## 2012-11-11 DIAGNOSIS — I83893 Varicose veins of bilateral lower extremities with other complications: Secondary | ICD-10-CM

## 2013-01-10 ENCOUNTER — Encounter: Payer: Self-pay | Admitting: Vascular Surgery

## 2013-01-11 ENCOUNTER — Encounter: Payer: Self-pay | Admitting: Vascular Surgery

## 2013-01-11 ENCOUNTER — Encounter (INDEPENDENT_AMBULATORY_CARE_PROVIDER_SITE_OTHER): Payer: Medicare Other | Admitting: *Deleted

## 2013-01-11 ENCOUNTER — Ambulatory Visit (INDEPENDENT_AMBULATORY_CARE_PROVIDER_SITE_OTHER): Payer: Medicare Other | Admitting: Vascular Surgery

## 2013-01-11 VITALS — BP 139/81 | HR 83 | Resp 16 | Ht 62.0 in | Wt 140.0 lb

## 2013-01-11 DIAGNOSIS — I83893 Varicose veins of bilateral lower extremities with other complications: Secondary | ICD-10-CM | POA: Insufficient documentation

## 2013-01-11 NOTE — Progress Notes (Addendum)
Subjective:     Patient ID: Paula Haynes, female   DOB: July 05, 1936, 77 y.o.   MRN: [id number]  HPI this patient was referred by Dr. Aleda Grana a [place] vein because of insurance reasons. Upon talking to the patient she has had laser ablation of all of the superficial veins in her legs. She is being evaluated today for spider veins.   Review of Systems     Objective:   Physical Exam BP 139/81  Pulse 83  Resp 16  Ht 5\' 2"  (1.575 m)  Wt 140 lb (63.504 kg)  BMI 25.6 kg/m2  General well-developed well-nourished female in no apparent distress Upon cursory examination patient has some spider veins in both lower extremities and the lateral and medial thighs in the lateral calf. She has no distal edema. She has no ulcerations.      Assessment:     Status post laser ablation both greater saphenous veins-now with spider veins    Plan:     Patient was informed regarding sclerotherapy for spider veins and this was discussed  She will consider this She is not a candidate for further laser ablation     This is a no charge consultation visit

## 2013-01-20 ENCOUNTER — Encounter: Payer: Self-pay | Admitting: Family Medicine

## 2013-02-09 ENCOUNTER — Ambulatory Visit (INDEPENDENT_AMBULATORY_CARE_PROVIDER_SITE_OTHER): Payer: Medicare Other | Admitting: Family Medicine

## 2013-02-09 ENCOUNTER — Encounter: Payer: Self-pay | Admitting: Family Medicine

## 2013-02-09 VITALS — BP 140/88 | HR 102 | Temp 97.8°F | Wt 142.0 lb

## 2013-02-09 DIAGNOSIS — I10 Essential (primary) hypertension: Secondary | ICD-10-CM

## 2013-02-09 MED ORDER — AMLODIPINE BESYLATE 5 MG PO TABS: 5.0000 mg | ORAL_TABLET | Freq: Every day | ORAL | Status: DC

## 2013-02-09 MED ORDER — HYDROCHLOROTHIAZIDE 12.5 MG PO CAPS: 12.5000 mg | ORAL_CAPSULE | Freq: Every day | ORAL | Status: DC

## 2013-02-09 NOTE — Progress Notes (Signed)
  Subjective:    Patient ID: Paula Haynes, female    DOB: [birth year], 77 y.o.   MRN: [id number]  HPI Followup hypertension Patient takes hydrochlorothiazide 12.5 mg daily and amlodipine 5 mg daily. Blood pressure is well controlled by home readings. No recent dizziness. Compliant with therapy. Needs refills of medication today. She has hypothyroidism followed by endocrinology.  No recent falls. No depression issues. Stays very with gardening.  Past Medical History  Diagnosis Date  . Hypertension   . Arthritis   . Thyroid disease    Past Surgical History  Procedure Laterality Date  . Refractive surgery      bil  . Finger surgery      right middle finger/due to glass window cutting finger    reports that she has never smoked. She has never used smokeless tobacco. She reports that she does not drink alcohol or use illicit drugs. family history includes Arthritis in an unspecified family member; Heart disease in her father; Hypertension in an unspecified family member; and Stroke in an unspecified family member. No Known Allergies    Review of Systems  Constitutional: Negative for appetite change, fatigue and unexpected weight change.  Eyes: Negative for visual disturbance.  Respiratory: Negative for cough, chest tightness, shortness of breath and wheezing.   Cardiovascular: Negative for chest pain, palpitations and leg swelling.  Neurological: Negative for dizziness, seizures, syncope, weakness, light-headedness and headaches.       Objective:   Physical Exam  Constitutional: She appears well-developed and well-nourished.  Neck: Neck supple. No thyromegaly present.  Cardiovascular: Normal rate and regular rhythm.   Pulmonary/Chest: Effort normal and breath sounds normal. No respiratory distress. She has no wheezes. She has no rales.  Musculoskeletal: She exhibits no edema.          Assessment & Plan:  Hypertension. Stable. Refill medications for one year.

## 2013-07-25 ENCOUNTER — Ambulatory Visit: Payer: Medicare Other | Admitting: Family Medicine

## 2013-10-19 DIAGNOSIS — I10 Essential (primary) hypertension: Secondary | ICD-10-CM | POA: Diagnosis not present

## 2013-10-19 DIAGNOSIS — E559 Vitamin D deficiency, unspecified: Secondary | ICD-10-CM | POA: Diagnosis not present

## 2013-10-19 DIAGNOSIS — E039 Hypothyroidism, unspecified: Secondary | ICD-10-CM | POA: Diagnosis not present

## 2013-11-08 DIAGNOSIS — I10 Essential (primary) hypertension: Secondary | ICD-10-CM | POA: Diagnosis not present

## 2014-02-27 ENCOUNTER — Other Ambulatory Visit: Payer: Self-pay | Admitting: Family Medicine

## 2014-03-27 ENCOUNTER — Other Ambulatory Visit: Payer: Self-pay | Admitting: Family Medicine

## 2014-04-26 ENCOUNTER — Other Ambulatory Visit: Payer: Self-pay | Admitting: Family Medicine

## 2014-05-01 ENCOUNTER — Other Ambulatory Visit: Payer: Self-pay | Admitting: Family Medicine

## 2014-05-05 ENCOUNTER — Ambulatory Visit: Payer: Medicare Other | Admitting: Family Medicine

## 2014-05-19 ENCOUNTER — Encounter: Payer: Self-pay | Admitting: Family Medicine

## 2014-05-19 ENCOUNTER — Ambulatory Visit (INDEPENDENT_AMBULATORY_CARE_PROVIDER_SITE_OTHER): Payer: Medicare Other | Admitting: Family Medicine

## 2014-05-19 VITALS — BP 140/88 | HR 86 | Temp 97.9°F | Wt 139.0 lb

## 2014-05-19 DIAGNOSIS — I10 Essential (primary) hypertension: Secondary | ICD-10-CM | POA: Diagnosis not present

## 2014-05-19 DIAGNOSIS — Z23 Encounter for immunization: Secondary | ICD-10-CM

## 2014-05-19 MED ORDER — AMLODIPINE BESYLATE 5 MG PO TABS: 5.0000 mg | ORAL_TABLET | Freq: Every day | ORAL | Status: DC

## 2014-05-19 MED ORDER — HYDROCHLOROTHIAZIDE 12.5 MG PO CAPS: 12.5000 mg | ORAL_CAPSULE | Freq: Every day | ORAL | Status: DC

## 2014-05-19 NOTE — Progress Notes (Signed)
Pre visit review using our clinic review tool, if applicable. No additional management support is needed unless otherwise documented below in the visit note. 

## 2014-05-19 NOTE — Progress Notes (Signed)
Subjective:    Patient ID: Paula Haynes, female    DOB: 04-05-1936, 78 y.o.   MRN: [id number]  HPI 78 year old female presents today for routine follow up on medication.  Her medical history is pertinent for HTN and hypothyroidism.  She states that she is well controlled on her medications.  Denies any symptoms of headache, visual changes, chest pains, SOB.  She stays very active and has had no changes in appetite or sleep.  She checks her pressure at home and usually runs around 120-130/ 60-70.  In the office today her pressure is elevated ranging 140-160/ 88.  Hypothyroidism is followed closely with endocrinology, and has had no issues with synthroid.  Endocrinologist took her off of HCTZ in march due to an increased Cr.  Patient will get the flu shot today.   Review of Systems  Constitutional: Negative for chills, activity change, appetite change, fatigue and unexpected weight change.  HENT: Negative for congestion.   Eyes: Negative for visual disturbance.  Respiratory: Negative for cough, chest tightness, shortness of breath and wheezing.   Cardiovascular: Negative for chest pain.  Gastrointestinal: Negative for nausea, vomiting, abdominal pain, diarrhea and constipation.  Endocrine: Negative for cold intolerance and heat intolerance.  Musculoskeletal: Negative for arthralgias, gait problem and myalgias.  Skin: Negative for color change.  Neurological: Negative for dizziness, weakness, light-headedness, numbness and headaches.  Psychiatric/Behavioral: Negative for sleep disturbance, dysphoric mood and agitation.    Current Outpatient Prescriptions on File Prior to Visit  Medication Sig Dispense Refill  . aspirin 81 MG tablet Take 81 mg by mouth daily.        Marland Kitchen levothyroxine (SYNTHROID, LEVOTHROID) 100 MCG tablet Take 100 mcg by mouth daily.         No current facility-administered medications on file prior to visit.   No Known Allergies  Past Medical History  Diagnosis Date    . Hypertension   . Arthritis   . Thyroid disease    Family History  Problem Relation Age of Onset  . Heart disease Father   . Arthritis      parent  . Hypertension      parent  . Stroke      parent   History   Social History  . Marital Status: Married    Spouse Name: N/A    Number of Children: N/A  . Years of Education: N/A   Occupational History  . Not on file.   Social History Main Topics  . Smoking status: Never Smoker   . Smokeless tobacco: Never Used  . Alcohol Use: No  . Drug Use: No  . Sexual Activity: Not on file   Other Topics Concern  . Not on file   Social History Narrative  . No narrative on file      Objective:   Physical Exam  Nursing note and vitals reviewed. Constitutional: She is oriented to person, place, and time. She appears well-developed and well-nourished. No distress.  Neck: No JVD present. No tracheal deviation present.  Cardiovascular: Normal rate, regular rhythm and normal heart sounds.   No murmur heard. No carotid bruit.  Hypertensive in office.  Pulmonary/Chest: Effort normal and breath sounds normal. No respiratory distress.  Musculoskeletal: Normal range of motion. She exhibits no edema and no tenderness.  Neurological: She is alert and oriented to person, place, and time.  Skin: She is not diaphoretic.  Psychiatric: She has a normal mood and affect. Her behavior is normal. Judgment and  thought content normal.          Assessment & Plan:  Hypertension- states that she is controlled at home.  Pressures were elevated in the office today ranging from 140-160/88.  Started back on HCTZ 12.5 mg.  Told to continue amlodipine 5 mg.  She will return in 3 weeks to have another blood pressure check as well as check renal function.    Joseph Art PA-S  Agree with above. Bruce Burchette M.D.

## 2014-05-22 ENCOUNTER — Telehealth: Payer: Self-pay | Admitting: Family Medicine

## 2014-05-22 NOTE — Telephone Encounter (Signed)
emmi emailed °

## 2014-05-31 ENCOUNTER — Other Ambulatory Visit: Payer: Self-pay | Admitting: Family Medicine

## 2014-06-08 ENCOUNTER — Ambulatory Visit (INDEPENDENT_AMBULATORY_CARE_PROVIDER_SITE_OTHER): Payer: Medicare Other | Admitting: Family Medicine

## 2014-06-08 ENCOUNTER — Encounter: Payer: Self-pay | Admitting: Family Medicine

## 2014-06-08 VITALS — BP 150/90 | HR 100 | Temp 97.7°F | Wt 140.0 lb

## 2014-06-08 DIAGNOSIS — I10 Essential (primary) hypertension: Secondary | ICD-10-CM | POA: Diagnosis not present

## 2014-06-08 NOTE — Patient Instructions (Signed)
Monitor blood pressure and be in touch if consistently > 150/90 

## 2014-06-08 NOTE — Progress Notes (Signed)
   Subjective:    Patient ID: Paula Haynes, female    DOB: Mar 02, 1936, 77 y.o.   MRN: [id number]  HPI Patient seen for follow-up hypertension. She had borderline elevated readings and we added back HCTZ 12.5 mg to her amlodipine last visit. Home blood pressures at been mostly below [id number] systolic and below 90 diastolic. Overall, she feels well. No headaches. No chest pains. No peripheral edema issues. Compliant with therapy. She has hypothyroidism managed by endocrinology. She has been eating some increased sodium lately. No alcohol use  Past Medical History  Diagnosis Date  . Hypertension   . Arthritis   . Thyroid disease    Past Surgical History  Procedure Laterality Date  . Refractive surgery      bil  . Finger surgery      right middle finger/due to glass window cutting finger    reports that she has never smoked. She has never used smokeless tobacco. She reports that she does not drink alcohol or use illicit drugs. family history includes Arthritis in an other family member; Heart disease in her father; Hypertension in an other family member; Stroke in an other family member. No Known Allergies    Review of Systems  Constitutional: Negative for appetite change and unexpected weight change.  Respiratory: Negative for shortness of breath.   Cardiovascular: Negative for chest pain, palpitations and leg swelling.       Objective:   Physical Exam  Constitutional: She appears well-developed and well-nourished.  Cardiovascular: Normal rate and regular rhythm.   Pulmonary/Chest: Effort normal and breath sounds normal. No respiratory distress. She has no wheezes. She has no rales.  Musculoskeletal: She exhibits no edema.          Assessment & Plan:  Hypertension. Borderline control by today's reading. We checked her blood pressure cuff with ours and her cuff was reading slightly higher. She has been well controlled on most of her home readings. Continue to monitor. Check  basic metabolic panel. Be in touch if consistently greater than 150/90. Reassess four months

## 2014-06-08 NOTE — Progress Notes (Signed)
Pre visit review using our clinic review tool, if applicable. No additional management support is needed unless otherwise documented below in the visit note. 

## 2014-06-09 LAB — BASIC METABOLIC PANEL
BUN: 32 mg/dL — ABNORMAL HIGH (ref 6–23)
CO2: 24 mEq/L (ref 19–32)
Calcium: 9.4 mg/dL (ref 8.4–10.5)
Chloride: 105 mEq/L (ref 96–112)
Creatinine, Ser: 1.3 mg/dL — ABNORMAL HIGH (ref 0.4–1.2)
GFR: 43.39 mL/min — ABNORMAL LOW (ref >60.00)
Glucose, Bld: 106 mg/dL — ABNORMAL HIGH (ref 70–99)
Potassium: 4 mEq/L (ref 3.5–5.1)
Sodium: 137 mEq/L (ref 135–145)

## 2014-06-19 DIAGNOSIS — Z1231 Encounter for screening mammogram for malignant neoplasm of breast: Secondary | ICD-10-CM | POA: Diagnosis not present

## 2014-06-19 DIAGNOSIS — Z1289 Encounter for screening for malignant neoplasm of other sites: Secondary | ICD-10-CM | POA: Diagnosis not present

## 2014-06-19 DIAGNOSIS — Z1212 Encounter for screening for malignant neoplasm of rectum: Secondary | ICD-10-CM | POA: Diagnosis not present

## 2014-07-28 LAB — HM DEXA SCAN: HM Dexa Scan: NORMAL

## 2014-10-09 ENCOUNTER — Ambulatory Visit: Payer: No Typology Code available for payment source | Admitting: Family Medicine

## 2015-05-07 ENCOUNTER — Other Ambulatory Visit: Payer: Self-pay | Admitting: Family Medicine

## 2015-05-24 ENCOUNTER — Other Ambulatory Visit: Payer: Self-pay | Admitting: Family Medicine

## 2015-06-07 ENCOUNTER — Other Ambulatory Visit: Payer: Self-pay | Admitting: Family Medicine

## 2015-07-06 ENCOUNTER — Other Ambulatory Visit: Payer: Self-pay | Admitting: Family Medicine

## 2015-10-29 DIAGNOSIS — E890 Postprocedural hypothyroidism: Secondary | ICD-10-CM | POA: Diagnosis not present

## 2015-10-29 DIAGNOSIS — E559 Vitamin D deficiency, unspecified: Secondary | ICD-10-CM | POA: Diagnosis not present

## 2015-10-29 DIAGNOSIS — I10 Essential (primary) hypertension: Secondary | ICD-10-CM | POA: Diagnosis not present

## 2015-11-25 ENCOUNTER — Other Ambulatory Visit: Payer: Self-pay | Admitting: Family Medicine

## 2015-11-29 DIAGNOSIS — E890 Postprocedural hypothyroidism: Secondary | ICD-10-CM | POA: Diagnosis not present

## 2015-11-29 DIAGNOSIS — I10 Essential (primary) hypertension: Secondary | ICD-10-CM | POA: Diagnosis not present

## 2015-12-24 ENCOUNTER — Other Ambulatory Visit: Payer: Self-pay | Admitting: Family Medicine

## 2016-01-09 DIAGNOSIS — Z01419 Encounter for gynecological examination (general) (routine) without abnormal findings: Secondary | ICD-10-CM | POA: Diagnosis not present

## 2016-01-09 DIAGNOSIS — Z124 Encounter for screening for malignant neoplasm of cervix: Secondary | ICD-10-CM | POA: Diagnosis not present

## 2016-01-09 DIAGNOSIS — Z1231 Encounter for screening mammogram for malignant neoplasm of breast: Secondary | ICD-10-CM | POA: Diagnosis not present

## 2016-01-10 LAB — HM MAMMOGRAPHY: HM Mammogram: NORMAL (ref 0–4)

## 2016-01-10 LAB — HM PAP SMEAR: HM Pap smear: NORMAL

## 2016-01-14 ENCOUNTER — Encounter: Payer: Self-pay | Admitting: Family Medicine

## 2016-01-22 ENCOUNTER — Other Ambulatory Visit: Payer: Self-pay | Admitting: Family Medicine

## 2016-01-28 ENCOUNTER — Other Ambulatory Visit: Payer: Self-pay | Admitting: Family Medicine

## 2016-01-28 MED ORDER — AMLODIPINE BESYLATE 5 MG PO TABS: 5.0000 mg | ORAL_TABLET | Freq: Every day | ORAL | Status: DC

## 2016-01-28 NOTE — Telephone Encounter (Signed)
Rx done. 

## 2016-01-28 NOTE — Telephone Encounter (Signed)
Pt request refill  amLODipine (NORVASC) 5 MG tablet  Pt did not realize she had not been in so long, and has only one tab left. Pt has made appointment for this Friday 02/01/16, but wants enough to get her through to her appointment if possible. Pt last seen 06/08/2014.  [facility]/ [address]

## 2016-02-01 ENCOUNTER — Ambulatory Visit (INDEPENDENT_AMBULATORY_CARE_PROVIDER_SITE_OTHER): Payer: PPO | Admitting: Family Medicine

## 2016-02-01 VITALS — BP 180/110 | HR 118 | Temp 98.3°F | Ht 62.0 in | Wt 140.0 lb

## 2016-02-01 DIAGNOSIS — I10 Essential (primary) hypertension: Secondary | ICD-10-CM | POA: Diagnosis not present

## 2016-02-01 MED ORDER — HYDROCHLOROTHIAZIDE 12.5 MG PO CAPS: 12.5000 mg | ORAL_CAPSULE | Freq: Every day | ORAL | Status: DC

## 2016-02-01 MED ORDER — AMLODIPINE BESYLATE 5 MG PO TABS: 5.0000 mg | ORAL_TABLET | Freq: Every day | ORAL | Status: DC

## 2016-02-01 NOTE — Progress Notes (Signed)
   Subjective:    Patient ID: Paula Haynes, female    DOB: Dec 09, 1935, 80 y.o.   MRN: [id number]  HPI Patient seen for follow-up hypertension Currently takes amlodipine 5 mg daily. In the past she was taking combination with HCTZ and had very well controlled blood pressures. Her hypothyroidism is followed by endocrinologist. She apparently had a small bump in her creatinine once and endocrinologist discontinued her HCTZ. This was done without notifying us. Patient has not been monitoring blood pressures recently. She does have occasional headaches. No chest pains. No dizziness. No peripheral edema issues. Hypothyroidism monitored by endocrinology.  Past Medical History  Diagnosis Date  . Hypertension   . Arthritis   . Thyroid disease    Past Surgical History  Procedure Laterality Date  . Refractive surgery      bil  . Finger surgery      right middle finger/due to glass window cutting finger    reports that she has never smoked. She has never used smokeless tobacco. She reports that she does not drink alcohol or use illicit drugs. family history includes Heart disease in her father. No Known Allergies    Review of Systems  Constitutional: Positive for fatigue. Negative for appetite change and unexpected weight change.  Eyes: Negative for visual disturbance.  Respiratory: Negative for cough, chest tightness, shortness of breath and wheezing.   Cardiovascular: Negative for chest pain, palpitations and leg swelling.  Neurological: Positive for headaches. Negative for dizziness, seizures, syncope, weakness and light-headedness.       Objective:   Physical Exam  Constitutional: She appears well-developed and well-nourished.  Neck: Neck supple. No thyromegaly present.  Cardiovascular: Normal rate and regular rhythm.   Pulmonary/Chest: Effort normal and breath sounds normal. No respiratory distress. She has no wheezes. She has no rales.  Musculoskeletal: She exhibits no edema.            Assessment & Plan:  Hypertension. Poorly controlled. Add back HCTZ 12.5 mg daily. Reassess in 2 weeks. Check basic metabolic panel then. She is encouraged to stay well-hydrated. Continue amlodipine with refills given. Patient will also set up Medicare wellness exam  Eulas Post MD [facility]

## 2016-02-01 NOTE — Progress Notes (Signed)
Pre visit review using our clinic review tool, if applicable. No additional management support is needed unless otherwise documented below in the visit note. 

## 2016-02-15 ENCOUNTER — Encounter: Payer: Self-pay | Admitting: Family Medicine

## 2016-02-15 ENCOUNTER — Ambulatory Visit (INDEPENDENT_AMBULATORY_CARE_PROVIDER_SITE_OTHER): Payer: PPO | Admitting: Family Medicine

## 2016-02-15 ENCOUNTER — Ambulatory Visit: Payer: PPO

## 2016-02-15 VITALS — BP 160/100 | HR 86 | Temp 99.4°F | Ht 62.0 in | Wt 138.0 lb

## 2016-02-15 DIAGNOSIS — I10 Essential (primary) hypertension: Secondary | ICD-10-CM | POA: Diagnosis not present

## 2016-02-15 DIAGNOSIS — M8580 Other specified disorders of bone density and structure, unspecified site: Secondary | ICD-10-CM

## 2016-02-15 DIAGNOSIS — Z0000 Encounter for general adult medical examination without abnormal findings: Secondary | ICD-10-CM

## 2016-02-15 LAB — BASIC METABOLIC PANEL
BUN: 36 mg/dL — ABNORMAL HIGH (ref 6–23)
CO2: 30 mEq/L (ref 19–32)
Calcium: 10.2 mg/dL (ref 8.4–10.5)
Chloride: 102 mEq/L (ref 96–112)
Creatinine, Ser: 1.39 mg/dL — ABNORMAL HIGH (ref 0.40–1.20)
GFR: 38.92 mL/min — ABNORMAL LOW (ref >60.00)
Glucose, Bld: 106 mg/dL — ABNORMAL HIGH (ref 70–99)
Potassium: 3.7 mEq/L (ref 3.5–5.1)
Sodium: 140 mEq/L (ref 135–145)

## 2016-02-15 MED ORDER — AMLODIPINE BESYLATE 10 MG PO TABS: 10.0000 mg | ORAL_TABLET | Freq: Every day | ORAL | Status: DC

## 2016-02-15 NOTE — Progress Notes (Signed)
Pre visit review using our clinic review tool, if applicable. No additional management support is needed unless otherwise documented below in the visit note. 

## 2016-02-15 NOTE — Progress Notes (Addendum)
Subjective:   Paula Haynes is a 80 y.o. female who presents for an subsequent Medicare Annual Wellness Visit.    HRA assessment completed during this visit with Ms Abegg  The Patient was informed that the wellness visit is to identify future health risk and educate and initiate measures that can reduce risk for increased disease through the lifespan.    NO ROS; Medicare Wellness Visit/ MD apt today  Last OV:  02/01/16 Labs completed: not completed here recently   Lifestyle review and risk: parent had HTN  HTN (GFR 39 12/2011)  Had hyperthyroidism; corrected and medically managed   Comes for apt annually; missed last year   Psychosocial: spouse expired x 22 yo;  Lives alone; built a log cabin; Has another home in [place]; rental properties  Lost Yorkie/ has one yorkie living but aging   Tobacco: never smoked  How many drinks do you have per week? none  Medications; HCTZ 12.5 added for elevated BP  Compliant but took meds at 9:30 this am  BMI: 25  Diet;   Breakfast and lunch Brunch; cheerios and fruit; bacon and eggs 2 to 3 times a week; sliced almonds; Adds fruit; blueberries and strawberries Supper; most time, eats leftovers from lunch Loves fish; eats grilled fish;  Loves salads    Nutritional counseling given: educated to monitor sodium  Educated regarding foods high in sodium and the DASH diet   Teeth or Denture issues? 2 times a year   Exercise;  climbs 3 flights of stairs daily Out and about; on the move  Yard work   HOME SAFETY; lives in multi-level home;   Fall hx; no  Fall risk: Fear of falling? no Gait: normal; get up and go is good  Given education on "Fall Prevention in the Home" for more safety tips the patient can apply as appropriate.  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 Anxious, depressed, irritable, sad or blue? no Denies feeling depressed or hopeless; voices pleasure in daily life  Pain: "curvature spine" starts to hurt after 10 hours of work; to discuss with Dr. 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 No  Stairs; climbs up and down 4 to 6 times a day  Mobilization and Functional losses from last year to this year?  5 bathrooms where she lives; separate shower if she prefers   Sleep pattern changes; no   Urinary or fecal incontinence reviewed: no  Advanced Directive addressed; has one completed; may update  Requested copy  Has some issues with  the way her spouse rec'd "supportive care" [facility];  Educated regarding LW is not withdrawal of supportive care and comfort measures.   Counseling Health Maintenance Gaps:  Colonoscopy; 07/2012 patient reported / went somewhere in [place]/ aged out now; not sure who did her colonoscopy. Was not [facility]  EKG: 09/2009 Mammogram: 12/2015/ [facility] Dexa/ 09/2009 forearm t score -2.1; femur -1.7 / Repeat 1/1 16 "normal" will bring/ in the car to show Dr. Elease Hashimoto;  Hx of Alendronate  10/09/2008 Bringing dexa in for Dr. Elease Hashimoto to review  PAP 12/2015  Hearing:  Right ear ? Left ear 2000 Declines audiology for hearing issues   Ophthalmology exam: the [facility]; 06/2015   Immunizations Due: (Vaccines reviewed and educated regarding any overdue)  TD due/ not sure when she had a tetanus  PCV 13;  will take this but temp 99.4 today / will hold for future visits.   Individual Goal: monitor BP and sodium   Health Recommendations and Referrals For repeat dexa; will schedule at [facility] Will follow BP several times a week; will purchase a cuff or have it checked at the same place around her home.    Current Care Team reviewed and updated [place], [place] [facility] 02/01/2016 4 visits since 02/09/2013 [facility] [place] 06/19/2014 1 visits since 06/19/2014  Education provided and lifestyle risk discussed   All Health Maintenance Gaps Reviewed for closure     Cardiac Risk Factors include: advanced age (>51men, >25 women);hypertension     Objective:    Today's Vitals   02/15/16 0910  BP: 160/100  Pulse: 86  Temp: 99.4 F (37.4 C)  Height: 5\' 2"  (1.575 m)  Weight: 138 lb (62.596 kg)  SpO2: 96%   Body mass index is 25.23 kg/(m^2).   Current Medications (verified) Outpatient Encounter Prescriptions as of 02/15/2016  Medication Sig  . aspirin 81 MG tablet Take 81 mg by mouth daily.    . cholecalciferol (VITAMIN D) 1000 units tablet Take 1,000 Units by mouth daily.  . hydrochlorothiazide (MICROZIDE) 12.5 MG capsule Take 1 capsule (12.5 mg total) by mouth daily.  Marland Kitchen levothyroxine (SYNTHROID, LEVOTHROID) 88 MCG tablet Take 88 mcg by mouth daily before breakfast.  . [DISCONTINUED] amLODipine (NORVASC) 5 MG tablet Take 1 tablet (5 mg total) by mouth daily.  Marland Kitchen amLODipine (NORVASC) 10 MG tablet Take 1 tablet (10 mg  total) by mouth daily.   No facility-administered encounter medications on file as of 02/15/2016.    Allergies (verified) Review of patient's allergies indicates no known allergies.   History: Past Medical History  Diagnosis Date  . Hypertension   . Arthritis   . Thyroid disease    Past Surgical History  Procedure Laterality Date  . Refractive surgery      bil  . Finger surgery      right middle finger/due to glass window cutting finger   Family History  Problem Relation Age of Onset  . Heart disease Father   . Arthritis      parent  . Hypertension      parent  . Stroke      parent   Social History   Occupational History  . Not on file.   Social History Main Topics  . Smoking status: Never Smoker   . Smokeless tobacco: Never Used  . Alcohol Use: No  .  Drug Use: No  . Sexual Activity: Not on file    Tobacco Counseling Counseling given: Yes   Activities of Daily Living In your present state of health, do you have any difficulty performing the following activities: 02/15/2016  Hearing? N  Vision? N  Difficulty concentrating or making decisions? N  Walking or climbing stairs? N  Dressing or bathing? N  Doing errands, shopping? N  Preparing Food and eating ? N  Using the Toilet? N  In the past six months, have you accidently leaked urine? N  Do you have problems with loss of bowel control? N  Managing your Medications? N  Managing your Finances? N  Housekeeping or managing your Housekeeping? N    Immunizations and Health Maintenance Immunization History  Administered Date(s) Administered  . Influenza Split 05/02/2011  . Influenza,inj,Quad PF,36+ Mos 05/19/2014  . Pneumococcal Polysaccharide-23 01/22/2012  . Zoster 01/22/2012   Health Maintenance Due  Topic Date Due  . TETANUS/TDAP  08/05/1956  . PNA vac Low Risk Adult (2 of 2 - PCV13) 01/21/2013    Patient Care Team: Eulas Post, MD as PCP - General  Indicate any recent Medical  Services you may have received from other than [facility] providers in the past year (date may be approximate).     Assessment:   This is a routine wellness examination for [facility]  Hearing/Vision screen  Hearing Screening   125Hz  250Hz  500Hz  1000Hz  2000Hz  4000Hz  8000Hz   Right ear:   100      Left ear:     100    Comments: May need audiology check Declines at present  Dietary issues and exercise activities discussed: Current Exercise Habits: Home exercise routine, Time (Minutes): > 60, Frequency (Times/Week): 5, Weekly Exercise (Minutes/Week): 0, Intensity: Moderate (regular house and yard work)  Goals    . patient     Monitor diet for sodium and check BP periodically Minimal Blood Pressure Goal= AVERAGE < 140/90; Ideal is an AVERAGE < 135/85. This AVERAGE should be calculated from @ least 5-7 BP readings taken @ different times of day on different days of week. You should not respond to isolated BP readings , but rather the AVERAGE for that week .Please bring your blood pressure cuff to office visits to verify that it is reliable.It can also be checked against the blood pressure device at the pharmacy. Finger or wrist cuffs are not dependable; an arm cuff is.  Also, monitor sodium intake in label of any canned food, as well as sodium in common everyday condiments; ketchup; salad dressing, sauces and any fast food restaurant.         Depression Screen PHQ 2/9 Scores 02/15/2016 05/19/2014 02/09/2013 01/22/2012  PHQ - 2 Score 0 0 0 0    Fall Risk Fall Risk  02/15/2016 05/19/2014 02/09/2013  Falls in the past year? No No No    Cognitive Function: MMSE - Mini Mental State Exam 02/15/2016  Not completed: (No Data)   Ad8 score 0; still active and very engaged; no issues at present  Screening Tests Health Maintenance  Topic Date Due  . TETANUS/TDAP  08/05/1956  . PNA vac Low Risk Adult (2 of 2 - PCV13) 01/21/2013  . INFLUENZA VACCINE  02/26/2016  . DEXA SCAN  Completed  . ZOSTAVAX   Completed      Plan:   Keep an eye on BP; check and record and bring to the doctor's office   Discussed prevnar and Tdap; will take today but has  temp 99.4; Dr Elease Hashimoto to review/ will hold until later apt.   Deaf & Hard of Hearing Division Services / can get free hearing aid if needed  No reviews  [facility]  [place] [address]  [phone]  Educated on sodium and relationship to BP Information given on the DASH plan  During the course of the visit, [place] was educated and counseled about the following appropriate screening and preventive services:   Vaccines to include Pneumoccal, Influenza, Hepatitis B, Td, Zostavax, HCV/ due prevnar and tdap; defer for now due to temp  Electrocardiogram  Cardiovascular disease screening  Colorectal cancer screening/ could not tell me who did her colonoscopy  Bone density screening/ ordered due to hx of osteopenia; Dexa was community screening; will order one at [facility]   Diabetes screening deferred   Glaucoma screening 06/2015  Mammography/ neg 12/2015  Nutrition counseling/ DASH diet   Smoking cessation counseling; n/a   Patient Instructions (the written plan) were given to the patient.    [id number], RN   02/15/2016   Agree with assessment and plan as above.  We decided with patient to hold on Tdap and Prevnar 13 today (no true fever but temp 99.4).  She already has follow up in one month and will pursue then.  Agree with bone density scan.  Eulas Post MD [facility]

## 2016-02-15 NOTE — Progress Notes (Signed)
Subjective:     Patient ID: Paula Haynes, female   DOB: May 25, 1936, 80 y.o.   MRN: [id number]  HPI Patient is here for Medicare wellness exam (per RN health coach) and also for follow-up regarding hypertension (with me). She had recent severely elevated blood pressure with reading in the 180/110. Already taking amlodipine 5 mg daily and we added HCTZ 12.5 mg daily. Not monitoring blood pressure at home. No medication side effects. No leg edema.  Past Medical History  Diagnosis Date  . Hypertension   . Arthritis   . Thyroid disease    Past Surgical History  Procedure Laterality Date  . Refractive surgery      bil  . Finger surgery      right middle finger/due to glass window cutting finger    reports that she has never smoked. She has never used smokeless tobacco. She reports that she does not drink alcohol or use illicit drugs. family history includes Heart disease in her father. No Known Allergies   Review of Systems  Constitutional: Negative for fatigue.  Eyes: Negative for visual disturbance.  Respiratory: Negative for cough, chest tightness, shortness of breath and wheezing.   Cardiovascular: Negative for chest pain, palpitations and leg swelling.  Neurological: Negative for dizziness, seizures, syncope, weakness, light-headedness and headaches.       Objective:   Physical Exam  Constitutional: She appears well-developed and well-nourished.  Eyes: Pupils are equal, round, and reactive to light.  Neck: Neck supple. No JVD present. No thyromegaly present.  Cardiovascular: Normal rate and regular rhythm.  Exam reveals no gallop.   Pulmonary/Chest: Effort normal and breath sounds normal. No respiratory distress. She has no wheezes. She has no rales.  Musculoskeletal: She exhibits no edema.  Neurological: She is alert.       Assessment:     Hypertension. Slightly improved but still suboptimal control    Plan:     -Check basic metabolic panel today with recent  initiation of HCTZ -Increase amlodipine to 10 mg daily -Reassess in one month  Eulas Post MD [facility]

## 2016-02-15 NOTE — Patient Instructions (Addendum)
Paula Haynes , Thank you for taking time to come for your Medicare Wellness Visit. I appreciate your ongoing commitment to your health goals. Please review the following plan we discussed and let me know if I can assist you in the future.   Keep an eye on BP; check and record and bring to the doctor's office   Discussed prevnar and Tdap; will take today but has temp 99.4; Dr Elease Hashimoto to review   Deaf & Hard of [place] / can get free hearing aid if needed  No reviews  [facility]  [address]  [phone]    These are the goals we discussed: Goals    . patient     Monitor diet for sodium and check BP periodically Minimal Blood Pressure Goal= AVERAGE < 140/90; Ideal is an AVERAGE < 135/85. This AVERAGE should be calculated from @ least 5-7 BP readings taken @ different times of day on different days of week. You should not respond to isolated BP readings , but rather the AVERAGE for that week .Please bring your blood pressure cuff to office visits to verify that it is reliable.It can also be checked against the blood pressure device at the pharmacy. Finger or wrist cuffs are not dependable; an arm cuff is.  Also, monitor sodium intake in label of any canned food, as well as sodium in common everyday condiments; ketchup; salad dressing, sauces and any fast food restaurant.          This is a list of the screening recommended for you and due dates:  Health Maintenance  Topic Date Due  . Tetanus Vaccine  08/05/1956  . Pneumonia vaccines (2 of 2 - PCV13) 01/21/2013  . Flu Shot  02/26/2016  . DEXA scan (bone density measurement)  Completed  . Shingles Vaccine  Completed   DASH Eating Plan DASH stands for "Dietary Approaches to Stop Hypertension." The DASH eating plan is a healthy eating plan that has been shown to reduce high blood pressure (hypertension). Additional health benefits may include reducing the risk of type 2 diabetes mellitus,  heart disease, and stroke. The DASH eating plan may also help with weight loss. WHAT DO I NEED TO KNOW ABOUT THE DASH EATING PLAN? For the DASH eating plan, you will follow these general guidelines:  Choose foods with a percent daily value for sodium of less than 5% (as listed on the food label).  Use salt-free seasonings or herbs instead of table salt or sea salt.  Check with your health care provider or pharmacist before using salt substitutes.  Eat lower-sodium products, often labeled as "lower sodium" or "no salt added."  Eat fresh foods.  Eat more vegetables, fruits, and low-fat dairy products.  Choose whole grains. Look for the word "whole" as the first word in the ingredient list.  Choose fish and skinless chicken or [place] more often than red meat. Limit fish, poultry, and meat to 6 oz (170 g) each day.  Limit sweets, desserts, sugars, and sugary drinks.  Choose heart-healthy fats.  Limit cheese to 1 oz (28 g) per day.  Eat more home-cooked food and less restaurant, buffet, and fast food.  Limit fried foods.  Cook foods using methods other than frying.  Limit canned vegetables. If you do use them, rinse them well to decrease the sodium.  When eating at a restaurant, ask that your food be prepared with less salt, or no salt if possible. WHAT FOODS CAN  I EAT? Seek help from a dietitian for individual calorie needs. Grains Whole grain or whole wheat bread. Brown rice. Whole grain or whole wheat pasta. Quinoa, bulgur, and whole grain cereals. Low-sodium cereals. Corn or whole wheat flour tortillas. Whole grain cornbread. Whole grain crackers. Low-sodium crackers. Vegetables Fresh or frozen vegetables (raw, steamed, roasted, or grilled). Low-sodium or reduced-sodium tomato and vegetable juices. Low-sodium or reduced-sodium tomato sauce and paste. Low-sodium or reduced-sodium canned vegetables.  Fruits All fresh, canned (in natural juice), or frozen fruits. Meat and Other  Protein Products Ground beef (85% or leaner), grass-fed beef, or beef trimmed of fat. Skinless chicken or [place]. Ground chicken or [place]. Pork trimmed of fat. All fish and seafood. Eggs. Dried beans, peas, or lentils. Unsalted nuts and seeds. Unsalted canned beans. Dairy Low-fat dairy products, such as skim or 1% milk, 2% or reduced-fat cheeses, low-fat ricotta or cottage cheese, or plain low-fat yogurt. Low-sodium or reduced-sodium cheeses. Fats and Oils Tub margarines without trans fats. Light or reduced-fat mayonnaise and salad dressings (reduced sodium). Avocado. Safflower, olive, or canola oils. Natural peanut or almond butter. Other Unsalted popcorn and pretzels. The items listed above may not be a complete list of recommended foods or beverages. Contact your dietitian for more options. WHAT FOODS ARE NOT RECOMMENDED? Grains White bread. White pasta. White rice. Refined cornbread. Bagels and croissants. Crackers that contain trans fat. Vegetables Creamed or fried vegetables. Vegetables in a cheese sauce. Regular canned vegetables. Regular canned tomato sauce and paste. Regular tomato and vegetable juices. Fruits Dried fruits. Canned fruit in light or heavy syrup. Fruit juice. Meat and Other Protein Products Fatty cuts of meat. Ribs, chicken wings, bacon, sausage, bologna, salami, chitterlings, fatback, hot dogs, bratwurst, and packaged luncheon meats. Salted nuts and seeds. Canned beans with salt. Dairy Whole or 2% milk, cream, half-and-half, and cream cheese. Whole-fat or sweetened yogurt. Full-fat cheeses or blue cheese. Nondairy creamers and whipped toppings. Processed cheese, cheese spreads, or cheese curds. Condiments Onion and garlic salt, seasoned salt, table salt, and sea salt. Canned and packaged gravies. Worcestershire sauce. Tartar sauce. Barbecue sauce. Teriyaki sauce. Soy sauce, including reduced sodium. Steak sauce. Fish sauce. Oyster sauce. Cocktail sauce. Horseradish.  Ketchup and mustard. Meat flavorings and tenderizers. Bouillon cubes. Hot sauce. Tabasco sauce. Marinades. Taco seasonings. Relishes. Fats and Oils Butter, stick margarine, lard, shortening, ghee, and bacon fat. Coconut, palm kernel, or palm oils. Regular salad dressings. Other Pickles and olives. Salted popcorn and pretzels. The items listed above may not be a complete list of foods and beverages to avoid. Contact your dietitian for more information. WHERE CAN I FIND MORE INFORMATION? National Heart, Lung, and Blood Institute: travelstabloid.com   This information is not intended to replace advice given to you by your health care provider. Make sure you discuss any questions you have with your health care provider.   Document Released: 07/03/2011 Document Revised: 08/04/2014 Document Reviewed: 05/18/2013 Elsevier Interactive Patient Education 2016 [place] ahead and increase Amlodipine to 10 mg daily.

## 2016-03-17 ENCOUNTER — Ambulatory Visit: Payer: PPO | Admitting: Family Medicine

## 2016-03-19 ENCOUNTER — Ambulatory Visit (INDEPENDENT_AMBULATORY_CARE_PROVIDER_SITE_OTHER): Payer: PPO | Admitting: Family Medicine

## 2016-03-19 VITALS — BP 124/80 | HR 107 | Temp 98.1°F | Ht 62.0 in | Wt 142.0 lb

## 2016-03-19 DIAGNOSIS — N183 Chronic kidney disease, stage 3 unspecified: Secondary | ICD-10-CM | POA: Insufficient documentation

## 2016-03-19 DIAGNOSIS — I10 Essential (primary) hypertension: Secondary | ICD-10-CM

## 2016-03-19 NOTE — Progress Notes (Signed)
Subjective:     Patient ID: Paula Haynes, female   DOB: [birth year], 80 y.o.   MRN: [id number]  HPI Follow-up hypertension Recently added HCTZ to amlodipine and she's had very good response. She is not monitoring blood pressures regularly. No dizziness. No headaches. No peripheral edema. She has hypothyroidism followed by endocrinology on levothyroxin. She eats lots of fruit. Recent basic metabolic panel GFR stable as she has stage III chronic kidney disease  Past Medical History:  Diagnosis Date  . Arthritis   . Hypertension   . Thyroid disease    Past Surgical History:  Procedure Laterality Date  . FINGER SURGERY     right middle finger/due to glass window cutting finger  . REFRACTIVE SURGERY     bil    reports that she has never smoked. She has never used smokeless tobacco. She reports that she does not drink alcohol or use drugs. family history includes Heart disease in her father. No Known Allergies   Review of Systems  Constitutional: Negative for fatigue.  Eyes: Negative for visual disturbance.  Respiratory: Negative for cough, chest tightness, shortness of breath and wheezing.   Cardiovascular: Negative for chest pain, palpitations and leg swelling.  Neurological: Negative for dizziness, seizures, syncope, weakness, light-headedness and headaches.       Objective:   Physical Exam  Constitutional: She appears well-developed and well-nourished.  Eyes: Pupils are equal, round, and reactive to light.  Neck: Neck supple. No JVD present. No thyromegaly present.  Cardiovascular: Normal rate and regular rhythm.  Exam reveals no gallop.   Pulmonary/Chest: Effort normal and breath sounds normal. No respiratory distress. She has no wheezes. She has no rales.  Musculoskeletal: She exhibits no edema.  Neurological: She is alert.       Assessment:     Hypertension. Greatly improved    Plan:     -Continue current medications -Routine follow-up in 3 months and repeat  basic metabolic panel then  Eulas Post MD [facility] at [facility]

## 2016-03-19 NOTE — Progress Notes (Signed)
Pre visit review using our clinic review tool, if applicable. No additional management support is needed unless otherwise documented below in the visit note. 

## 2016-03-26 ENCOUNTER — Ambulatory Visit: Payer: PPO | Admitting: Family Medicine

## 2016-06-17 ENCOUNTER — Ambulatory Visit (INDEPENDENT_AMBULATORY_CARE_PROVIDER_SITE_OTHER): Payer: PPO | Admitting: Family Medicine

## 2016-06-17 VITALS — BP 140/80 | HR 108 | Temp 98.3°F | Ht 62.0 in | Wt 138.3 lb

## 2016-06-17 DIAGNOSIS — Z23 Encounter for immunization: Secondary | ICD-10-CM

## 2016-06-17 DIAGNOSIS — I10 Essential (primary) hypertension: Secondary | ICD-10-CM | POA: Diagnosis not present

## 2016-06-17 NOTE — Progress Notes (Signed)
Pre visit review using our clinic review tool, if applicable. No additional management support is needed unless otherwise documented below in the visit note. 

## 2016-06-17 NOTE — Progress Notes (Signed)
Subjective:     Patient ID: Paula Haynes, female   DOB: 1935-10-17, 80 y.o.   MRN: [id number]  HPI Patient seen for follow-up hypertension. Recently added HCTZ to amlodipine. Her blood pressures have been improved. No dizziness. No headaches. No peripheral edema. She has hypothyroidism followed by endocrinology. Stays very active physically. No recent chest pains. Compliant with therapy. Still needs flu vaccine and also no documentation of Prevnar 13  Past Medical History:  Diagnosis Date  . Arthritis   . Hypertension   . Thyroid disease    Past Surgical History:  Procedure Laterality Date  . FINGER SURGERY     right middle finger/due to glass window cutting finger  . REFRACTIVE SURGERY     bil    reports that she has never smoked. She has never used smokeless tobacco. She reports that she does not drink alcohol or use drugs. family history includes Heart disease in her father. No Known Allergies   Review of Systems  Constitutional: Negative for fatigue.  Eyes: Negative for visual disturbance.  Respiratory: Negative for cough, chest tightness, shortness of breath and wheezing.   Cardiovascular: Negative for chest pain, palpitations and leg swelling.  Neurological: Negative for dizziness, seizures, syncope, weakness, light-headedness and headaches.       Objective:   Physical Exam  Constitutional: She appears well-developed and well-nourished.  Eyes: Pupils are equal, round, and reactive to light.  Neck: Neck supple. No JVD present. No thyromegaly present.  Cardiovascular: Normal rate and regular rhythm.  Exam reveals no gallop.   Pulmonary/Chest: Effort normal and breath sounds normal. No respiratory distress. She has no wheezes. She has no rales.  Musculoskeletal: She exhibits no edema.  Neurological: She is alert.       Assessment:     Hypertension. Improved    Plan:     -Check basic metabolic panel -Flu vaccine and Prevnar 13 given -Routine follow-up in 6  months and sooner as needed  Eulas Post MD [facility] at [facility]

## 2016-06-18 LAB — BASIC METABOLIC PANEL
BUN: 40 mg/dL — ABNORMAL HIGH (ref 6–23)
CO2: 26 mEq/L (ref 19–32)
Calcium: 9.4 mg/dL (ref 8.4–10.5)
Chloride: 100 mEq/L (ref 96–112)
Creatinine, Ser: 1.42 mg/dL — ABNORMAL HIGH (ref 0.40–1.20)
GFR: 37.94 mL/min — ABNORMAL LOW (ref >60.00)
Glucose, Bld: 110 mg/dL — ABNORMAL HIGH (ref 70–99)
Potassium: 3.3 mEq/L — ABNORMAL LOW (ref 3.5–5.1)
Sodium: 140 mEq/L (ref 135–145)

## 2016-06-24 ENCOUNTER — Other Ambulatory Visit: Payer: Self-pay

## 2016-10-28 DIAGNOSIS — E890 Postprocedural hypothyroidism: Secondary | ICD-10-CM | POA: Diagnosis not present

## 2016-10-28 DIAGNOSIS — I10 Essential (primary) hypertension: Secondary | ICD-10-CM | POA: Diagnosis not present

## 2016-10-28 DIAGNOSIS — E559 Vitamin D deficiency, unspecified: Secondary | ICD-10-CM | POA: Diagnosis not present

## 2016-11-10 DIAGNOSIS — Z7689 Persons encountering health services in other specified circumstances: Secondary | ICD-10-CM | POA: Diagnosis not present

## 2016-11-10 DIAGNOSIS — E890 Postprocedural hypothyroidism: Secondary | ICD-10-CM | POA: Diagnosis not present

## 2016-11-10 DIAGNOSIS — I10 Essential (primary) hypertension: Secondary | ICD-10-CM | POA: Diagnosis not present

## 2016-12-08 DIAGNOSIS — E890 Postprocedural hypothyroidism: Secondary | ICD-10-CM | POA: Diagnosis not present

## 2017-01-27 DIAGNOSIS — M4316 Spondylolisthesis, lumbar region: Secondary | ICD-10-CM | POA: Diagnosis not present

## 2017-01-27 DIAGNOSIS — M545 Low back pain: Secondary | ICD-10-CM | POA: Diagnosis not present

## 2017-01-27 DIAGNOSIS — M4126 Other idiopathic scoliosis, lumbar region: Secondary | ICD-10-CM | POA: Diagnosis not present

## 2017-01-27 DIAGNOSIS — M5137 Other intervertebral disc degeneration, lumbosacral region: Secondary | ICD-10-CM | POA: Diagnosis not present

## 2017-02-03 ENCOUNTER — Other Ambulatory Visit: Payer: Self-pay | Admitting: Family Medicine

## 2017-05-04 ENCOUNTER — Other Ambulatory Visit: Payer: Self-pay | Admitting: Family Medicine

## 2017-06-12 DIAGNOSIS — H04123 Dry eye syndrome of bilateral lacrimal glands: Secondary | ICD-10-CM | POA: Diagnosis not present

## 2017-08-06 ENCOUNTER — Other Ambulatory Visit: Payer: Self-pay | Admitting: Family Medicine

## 2017-11-03 ENCOUNTER — Other Ambulatory Visit: Payer: Self-pay | Admitting: Family Medicine

## 2017-11-04 DIAGNOSIS — E890 Postprocedural hypothyroidism: Secondary | ICD-10-CM | POA: Diagnosis not present

## 2017-11-04 DIAGNOSIS — M859 Disorder of bone density and structure, unspecified: Secondary | ICD-10-CM | POA: Diagnosis not present

## 2017-11-04 DIAGNOSIS — M8580 Other specified disorders of bone density and structure, unspecified site: Secondary | ICD-10-CM | POA: Diagnosis not present

## 2017-11-04 DIAGNOSIS — I10 Essential (primary) hypertension: Secondary | ICD-10-CM | POA: Diagnosis not present

## 2017-11-04 DIAGNOSIS — E559 Vitamin D deficiency, unspecified: Secondary | ICD-10-CM | POA: Diagnosis not present

## 2017-11-16 ENCOUNTER — Other Ambulatory Visit: Payer: Self-pay | Admitting: Family Medicine

## 2017-11-16 ENCOUNTER — Ambulatory Visit (INDEPENDENT_AMBULATORY_CARE_PROVIDER_SITE_OTHER): Payer: PPO | Admitting: Family Medicine

## 2017-11-16 ENCOUNTER — Encounter: Payer: Self-pay | Admitting: Family Medicine

## 2017-11-16 VITALS — BP 140/80 | HR 78 | Temp 98.3°F | Resp 16 | Ht 60.25 in | Wt 142.3 lb

## 2017-11-16 DIAGNOSIS — E039 Hypothyroidism, unspecified: Secondary | ICD-10-CM | POA: Diagnosis not present

## 2017-11-16 DIAGNOSIS — M810 Age-related osteoporosis without current pathological fracture: Secondary | ICD-10-CM | POA: Diagnosis not present

## 2017-11-16 DIAGNOSIS — N183 Chronic kidney disease, stage 3 unspecified: Secondary | ICD-10-CM

## 2017-11-16 DIAGNOSIS — I10 Essential (primary) hypertension: Secondary | ICD-10-CM | POA: Diagnosis not present

## 2017-11-16 MED ORDER — AMLODIPINE BESYLATE 10 MG PO TABS: 10.0000 mg | ORAL_TABLET | Freq: Every day | ORAL | 3 refills | Status: DC

## 2017-11-16 NOTE — Progress Notes (Signed)
Subjective:     Patient ID: Paula Haynes, female   DOB: 07-09-1936, 82 y.o.   MRN: [id number]  HPI Patient seen for medical follow-up. She has history of chronic problems including hypertension, osteoporosis, hypothyroidism. She is followed by endocrinology and they have been recently monitoring her labs. She states she had recent lab work done just earlier this month with TSH, vitamin D level, and basic chemistries. She is requesting refills of amlodipine. She previously took HCTZ but her renal function seemed to deteriorate when she was taking that. She is on amlodipine 10 mg daily. Only mild leg edema. No dyspnea.  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 Last colonoscopy 2014. She's had previous shingles vaccine. Last mammogram about 2 years ago.  Osteoporosis. No recent falls. Was recently started on Prolia injections per endocrinology  Unfortunately, lost daughter to colon cancer earlier this year. She's been dealing with that but seems to be coping fairly well.  Past Medical History:  Diagnosis Date  . Arthritis   . Hypertension   . Thyroid disease    Past Surgical History:  Procedure Laterality Date  . FINGER SURGERY     right middle finger/due to glass window cutting finger  . REFRACTIVE SURGERY     bil    reports that she has never smoked. She has never used smokeless tobacco. She reports that she does not drink alcohol or use drugs. family history includes Arthritis in her unknown relative; Heart disease in her father; Hypertension in her unknown relative; Stroke in her unknown relative. No Known Allergies   Review of Systems  Constitutional: Negative for fatigue.  Eyes: Negative for visual disturbance.  Respiratory: Negative for cough, chest tightness, shortness of breath and wheezing.   Cardiovascular: Negative for chest pain, palpitations and leg swelling.  Neurological: Negative for dizziness,  seizures, syncope, weakness, light-headedness and headaches.       Objective:   Physical Exam  Constitutional: She appears well-developed and well-nourished.  Eyes: Pupils are equal, round, and reactive to light.  Neck: Neck supple. No JVD present. No thyromegaly present.  Cardiovascular: Normal rate and regular rhythm. Exam reveals no gallop.  Pulmonary/Chest: Effort normal and breath sounds normal. No respiratory distress. She has no wheezes. She has no rales.  Musculoskeletal: She exhibits no edema.  Neurological: She is alert.       Assessment:     #1 hypertension stable  #2 osteoporosis  #3 hypothyroidism  #4 history of chronic kidney disease   #5 health maintenance    Plan:     -Refilled amlodipine for one year -Recommend consider repeat mammogram this year. We also discussed new shingles vaccine and she will check on insurance coverage -We did not do any labs today since she states these were done recently per endocrinology  Eulas Post MD [facility]

## 2017-11-16 NOTE — Patient Instructions (Signed)
Consider repeat Mammogram by this summer  Consider new shingles vaccine (Shingrix).

## 2017-11-16 NOTE — Telephone Encounter (Signed)
Copied from [place] [id number]. Topic: Quick Communication - Rx Refill/Question >> Nov 16, 2017  5:44 PM Paula Haynes wrote: Medication: amLODipine (NORVASC) 10 MG tablet [[id number]] Has the patient contacted their pharmacy? Yes.   (Agent: If no, request that the patient contact the pharmacy for the refill.) Preferred Pharmacy (with phone number or street name): [facility] Agent: Please be advised that RX refills may take up to 3 business days. We ask that you follow-up with your pharmacy.  Pt is at pharmacy now and is completely out, call pt if needed

## 2017-11-18 DIAGNOSIS — M810 Age-related osteoporosis without current pathological fracture: Secondary | ICD-10-CM | POA: Diagnosis not present

## 2017-11-23 ENCOUNTER — Telehealth: Payer: Self-pay | Admitting: Family Medicine

## 2017-11-23 NOTE — Telephone Encounter (Signed)
OK with me.

## 2017-11-23 NOTE — Telephone Encounter (Signed)
Copied from [place] [id number]. Topic: Inquiry >> Nov 23, 2017  4:36 PM Oliver Pila B wrote: Reason for CRM: pt called to switch pcp from [facility] to [facility], the distance is more convenient, call pt if needed

## 2017-11-24 NOTE — Telephone Encounter (Signed)
[place] with me. Please place her in a new pt slot for transfer.

## 2017-12-11 DIAGNOSIS — D485 Neoplasm of uncertain behavior of skin: Secondary | ICD-10-CM | POA: Diagnosis not present

## 2017-12-11 DIAGNOSIS — L570 Actinic keratosis: Secondary | ICD-10-CM | POA: Diagnosis not present

## 2017-12-11 DIAGNOSIS — L821 Other seborrheic keratosis: Secondary | ICD-10-CM | POA: Diagnosis not present

## 2017-12-28 ENCOUNTER — Ambulatory Visit (INDEPENDENT_AMBULATORY_CARE_PROVIDER_SITE_OTHER): Payer: PPO | Admitting: Family Medicine

## 2017-12-28 ENCOUNTER — Encounter: Payer: Self-pay | Admitting: Family Medicine

## 2017-12-28 VITALS — BP 146/83 | HR 80 | Temp 98.4°F | Resp 20 | Ht 60.0 in | Wt 142.0 lb

## 2017-12-28 DIAGNOSIS — N183 Chronic kidney disease, stage 3 unspecified: Secondary | ICD-10-CM

## 2017-12-28 DIAGNOSIS — M810 Age-related osteoporosis without current pathological fracture: Secondary | ICD-10-CM

## 2017-12-28 DIAGNOSIS — E039 Hypothyroidism, unspecified: Secondary | ICD-10-CM | POA: Diagnosis not present

## 2017-12-28 DIAGNOSIS — I10 Essential (primary) hypertension: Secondary | ICD-10-CM | POA: Diagnosis not present

## 2017-12-28 NOTE — Patient Instructions (Signed)
Watch your sodium.  Once I get labs back, we will start a low dose losartan or lisinopril and follow up closely for recheck.   It was a pleasure meeting you today.     Low-Sodium Eating Plan Sodium, which is an element that makes up salt, helps you maintain a healthy balance of fluids in your body. Too much sodium can increase your blood pressure and cause fluid and waste to be held in your body. Your health care provider or dietitian may recommend following this plan if you have high blood pressure (hypertension), kidney disease, liver disease, or heart failure. Eating less sodium can help lower your blood pressure, reduce swelling, and protect your heart, liver, and kidneys. What are tips for following this plan? General guidelines  Most people on this plan should limit their sodium intake to 1,500-2,000 mg (milligrams) of sodium each day. Reading food labels  The Nutrition Facts label lists the amount of sodium in one serving of the food. If you eat more than one serving, you must multiply the listed amount of sodium by the number of servings.  Choose foods with less than 140 mg of sodium per serving.  Avoid foods with 300 mg of sodium or more per serving. Shopping  Look for lower-sodium products, often labeled as "low-sodium" or "no salt added."  Always check the sodium content even if foods are labeled as "unsalted" or "no salt added".  Buy fresh foods. ? Avoid canned foods and premade or frozen meals. ? Avoid canned, cured, or processed meats  Buy breads that have less than 80 mg of sodium per slice. Cooking  Eat more home-cooked food and less restaurant, buffet, and fast food.  Avoid adding salt when cooking. Use salt-free seasonings or herbs instead of table salt or sea salt. Check with your health care provider or pharmacist before using salt substitutes.  Cook with plant-based oils, such as canola, sunflower, or olive oil. Meal planning  When eating at a restaurant,  ask that your food be prepared with less salt or no salt, if possible.  Avoid foods that contain MSG (monosodium glutamate). MSG is sometimes added to [place] food, bouillon, and some canned foods. What foods are recommended? The items listed may not be a complete list. Talk with your dietitian about what dietary choices are best for you. Grains Low-sodium cereals, including oats, puffed wheat and rice, and shredded wheat. Low-sodium crackers. Unsalted rice. Unsalted pasta. Low-sodium bread. Whole-grain breads and whole-grain pasta. Vegetables Fresh or frozen vegetables. "No salt added" canned vegetables. "No salt added" tomato sauce and paste. Low-sodium or reduced-sodium tomato and vegetable juice. Fruits Fresh, frozen, or canned fruit. Fruit juice. Meats and other protein foods Fresh or frozen (no salt added) meat, poultry, seafood, and fish. Low-sodium canned tuna and salmon. Unsalted nuts. Dried peas, beans, and lentils without added salt. Unsalted canned beans. Eggs. Unsalted nut butters. Dairy Milk. Soy milk. Cheese that is naturally low in sodium, such as ricotta cheese, fresh mozzarella, or Swiss cheese Low-sodium or reduced-sodium cheese. Cream cheese. Yogurt. Fats and oils Unsalted butter. Unsalted margarine with no trans fat. Vegetable oils such as canola or olive oils. Seasonings and other foods Fresh and dried herbs and spices. Salt-free seasonings. Low-sodium mustard and ketchup. Sodium-free salad dressing. Sodium-free light mayonnaise. Fresh or refrigerated horseradish. Lemon juice. Vinegar. Homemade, reduced-sodium, or low-sodium soups. Unsalted popcorn and pretzels. Low-salt or salt-free chips. What foods are not recommended? The items listed may not be a complete list. Talk with your  dietitian about what dietary choices are best for you. Grains Instant hot cereals. Bread stuffing, pancake, and biscuit mixes. Croutons. Seasoned rice or pasta mixes. Noodle soup cups. Boxed or  frozen macaroni and cheese. Regular salted crackers. Self-rising flour. Vegetables Sauerkraut, pickled vegetables, and relishes. Olives. [place] fries. Onion rings. Regular canned vegetables (not low-sodium or reduced-sodium). Regular canned tomato sauce and paste (not low-sodium or reduced-sodium). Regular tomato and vegetable juice (not low-sodium or reduced-sodium). Frozen vegetables in sauces. Meats and other protein foods Meat or fish that is salted, canned, smoked, spiced, or pickled. Bacon, ham, sausage, hotdogs, corned beef, chipped beef, packaged lunch meats, salt pork, jerky, pickled herring, anchovies, regular canned tuna, sardines, salted nuts. Dairy Processed cheese and cheese spreads. Cheese curds. Blue cheese. Feta cheese. String cheese. Regular cottage cheese. Buttermilk. Canned milk. Fats and oils Salted butter. Regular margarine. Ghee. Bacon fat. Seasonings and other foods Onion salt, garlic salt, seasoned salt, table salt, and sea salt. Canned and packaged gravies. Worcestershire sauce. Tartar sauce. Barbecue sauce. Teriyaki sauce. Soy sauce, including reduced-sodium. Steak sauce. Fish sauce. Oyster sauce. Cocktail sauce. Horseradish that you find on the shelf. Regular ketchup and mustard. Meat flavorings and tenderizers. Bouillon cubes. Hot sauce and Tabasco sauce. Premade or packaged marinades. Premade or packaged taco seasonings. Relishes. Regular salad dressings. Salsa. Potato and tortilla chips. Corn chips and puffs. Salted popcorn and pretzels. Canned or dried soups. Pizza. Frozen entrees and pot pies. Summary  Eating less sodium can help lower your blood pressure, reduce swelling, and protect your heart, liver, and kidneys.  Most people on this plan should limit their sodium intake to 1,500-2,000 mg (milligrams) of sodium each day.  Canned, boxed, and frozen foods are high in sodium. Restaurant foods, fast foods, and pizza are also very high in sodium. You also get sodium by  adding salt to food.  Try to cook at home, eat more fresh fruits and vegetables, and eat less fast food, canned, processed, or prepared foods. This information is not intended to replace advice given to you by your health care provider. Make sure you discuss any questions you have with your health care provider. Document Released: 01/03/2002 Document Revised: 07/07/2016 Document Reviewed: 07/07/2016 Elsevier Interactive Patient Education  [employer].   Please help [place] help you:  We are honored you have chosen [facility] for your Primary Care home. Below you will find basic instructions that you may need to access in the future. Please help [place] help you by reading the instructions, which cover many of the frequent questions we experience.   Prescription refills and request:  -In order to allow more efficient response time, please call your pharmacy for all refills. They will forward the request electronically to [place]. This allows for the quickest possible response. Request left on a nurse line can take longer to refill, since these are checked as time allows between office patients and other phone calls.  - refill request can take up to 3-5 working days to complete.  - If request is sent electronically and request is appropiate, it is usually completed in 1-2 business days.  - all patients will need to be seen routinely for all chronic medical conditions requiring prescription medications (see follow-up below). If you are overdue for follow up on your condition, you will be asked to make an appointment and we will call in enough medication to cover you until your appointment (up to 30 days).  - all controlled substances will require a face  to face visit to request/refill.  - if you desire your prescriptions to go through a new pharmacy, and have an active script at original pharmacy, you will need to call your pharmacy and have scripts transferred to new pharmacy. This is completed between  the pharmacy locations and not by your provider.    Results: If any images or labs were ordered, it can take up to 1 week to get results depending on the test ordered and the lab/facility running and resulting the test. - Normal or stable results, which do not need further discussion, may be released to your mychart immediately with attached note to you. A call may not be generated for normal results. Please make certain to sign up for mychart. If you have questions on how to activate your mychart you can call the front office.  - If your results need further discussion, our office will attempt to contact you via phone, and if unable to reach you after 2 attempts, we will release your abnormal result to your mychart with instructions.  - All results will be automatically released in mychart after 1 week.  - Your provider will provide you with explanation and instruction on all relevant material in your results. Please keep in mind, results and labs may appear confusing or abnormal to the untrained eye, but it does not mean they are actually abnormal for you personally. If you have any questions about your results that are not covered, or you desire more detailed explanation than what was provided, you should make an appointment with your provider to do so.   Our office handles many outgoing and incoming calls daily. If we have not contacted you within 1 week about your results, please check your mychart to see if there is a message first and if not, then contact our office.  In helping with this matter, you help decrease call volume, and therefore allow [place] to be able to respond to patients needs more efficiently.   Acute office visits (sick visit):  An acute visit is intended for a new problem and are scheduled in shorter time slots to allow schedule openings for patients with new problems. This is the appropriate visit to discuss a new problem. Problems will not be addressed by phone call or Echart  message. Appointment is needed if requesting treatment. In order to provide you with excellent quality medical care with proper time for you to explain your problem, have an exam and receive treatment with instructions, these appointments should be limited to one new problem per visit. If you experience a new problem, in which you desire to be addressed, please make an acute office visit, we save openings on the schedule to accommodate you. Please do not save your new problem for any other type of visit, let us take care of it properly and quickly for you.   Follow up visits:  Depending on your condition(s) your provider will need to see you routinely in order to provide you with quality care and prescribe medication(s). Most chronic conditions (Example: hypertension, Diabetes, depression/anxiety... etc), require visits a couple times a year. Your provider will instruct you on proper follow up for your personal medical conditions and history. Please make certain to make follow up appointments for your condition as instructed. Failing to do so could result in lapse in your medication treatment/refills. If you request a refill, and are overdue to be seen on a condition, we will always provide you with a 30 day script (once)  to allow you time to schedule.    Medicare wellness (well visit): - we have a wonderful Nurse Maudie Mercury), that will meet with you and provide you will yearly medicare wellness visits. These visits should occur yearly (can not be scheduled less than 1 calendar year apart) and cover preventive health, immunizations, advance directives and screenings you are entitled to yearly through your medicare benefits. Do not miss out on your entitled benefits, this is when medicare will pay for these benefits to be ordered for you.  These are strongly encouraged by your provider and is the appropriate type of visit to make certain you are up to date with all preventive health benefits. If you have not had  your medicare wellness exam in the last 12 months, please make certain to schedule one by calling the office and schedule your medicare wellness with Maudie Mercury as soon as possible.   Yearly physical (well visit):  - Adults are recommended to be seen yearly for physicals. Check with your insurance and date of your last physical, most insurances require one calendar year between physicals. Physicals include all preventive health topics, screenings, medical exam and labs that are appropriate for gender/age and history. You may have fasting labs needed at this visit. This is a well visit (not a sick visit), new problems should not be covered during this visit (see acute visit).  - Pediatric patients are seen more frequently when they are younger. Your provider will advise you on well child visit timing that is appropriate for your their age. - This is not a medicare wellness visit. Medicare wellness exams do not have an exam portion to the visit. Some medicare companies allow for a physical, some do not allow a yearly physical. If your medicare allows a yearly physical you can schedule the medicare wellness with our nurse Maudie Mercury and have your physical with your provider after, on the same day. Please check with insurance for your full benefits.   Late Policy/No Shows:  - all new patients should arrive 15-30 minutes earlier than appointment to allow [place] time  to  obtain all personal demographics,  insurance information and for you to complete office paperwork. - All established patients should arrive 10-15 minutes earlier than appointment time to update all information and be checked in .  - In our best efforts to run on time, if you are late for your appointment you will be asked to either reschedule or if able, we will work you back into the schedule. There will be a wait time to work you back in the schedule,  depending on availability.  - If you are unable to make it to your appointment as scheduled, please call 24  hours ahead of time to allow [place] to fill the time slot with someone else who needs to be seen. If you do not cancel your appointment ahead of time, you may be charged a no show fee.

## 2017-12-28 NOTE — Progress Notes (Signed)
Patient ID: Paula Haynes, female  DOB: Dec 07, 1935, 82 y.o.   MRN: [id number] Patient Care Team    Relationship Specialty Notifications Start End  Paula Hillock, DO PCP - General Family Medicine  12/28/17   Paula Pi, MD Consulting Physician Endocrinology  11/16/17     Chief Complaint  Patient presents with  . Establish Care    Subjective:  Paula Haynes is a 82 y.o.  female present for new patient establishment. All past medical history, surgical history, allergies, family history, immunizations, medications and social history were updatedin the electronic medical record today. All recent labs, ED visits and hospitalizations within the last year were reviewed.  Hypertension/CKD: Pt reports compliance with amlodipine 10 mg daily. Blood pressures ranges at home not routinely checked. Patient denies chest pain, shortness of breath or lower extremity edema. Pt takes a  daily baby ASA. Pt is  Not prescribed statin.  She takes omega-3 1000 mg a day. BMP: 06/17/2016, low potassium 3.3, glucose 110, BUN 40, creatinine 1.42, GFR 37.9 CBC: CBC in the system.  Patient reports she has them completed at her endocrinologist office. Lipids:  Diet: No routine diet.  Patient states she eats anything she wants. Exercise: No routine exercise, but she is very active in her house and in her yard.   Hypothyroidism: Managed by Dr. Chalmers Haynes, endocrinologist.  Synthroid 100 mcg daily.  She reports compliance.  Osteoporosis: She reports compliance with vitamin D 5000 units daily.  January 2016 last bone density.   Depression screen [facility] 2/9 12/28/2017 11/16/2017 02/15/2016 05/19/2014 02/09/2013  Decreased Interest 0 1 0 0 0  Down, Depressed, Hopeless 0 0 0 0 0  PHQ - 2 Score 0 1 0 0 0   No flowsheet data found.    Fall Risk  12/28/2017 11/16/2017 02/15/2016 05/19/2014 02/09/2013  Falls in the past year? No No No No No     Immunization History  Administered Date(s) Administered  . Influenza  Split 05/02/2011  . Influenza, High Dose Seasonal PF 06/17/2016  . Influenza,inj,Quad PF,6+ Mos 05/19/2014  . Pneumococcal Conjugate-13 06/17/2016  . Pneumococcal Polysaccharide-23 01/22/2012  . Zoster 01/22/2012    No exam data present  Past Medical History:  Diagnosis Date  . Arthritis   . History of blood product transfusion   . Hypertension   . Thyroid disease    No Known Allergies Past Surgical History:  Procedure Laterality Date  . FINGER SURGERY     right middle finger/due to glass window cutting finger  . REFRACTIVE SURGERY     bil   Family History  Problem Relation Age of Onset  . Heart disease Father   . Arthritis Unknown        parent  . Hypertension Unknown        parent  . Stroke Unknown        parent   Social History   Socioeconomic History  . Marital status: Married    Spouse name: Not on file  . Number of children: Not on file  . Years of education: Not on file  . Highest education level: Not on file  Occupational History  . Not on file  Social Needs  . Financial resource strain: Not on file  . Food insecurity:    Worry: Not on file    Inability: Not on file  . Transportation needs:    Medical: Not on file    Non-medical: Not on file  Tobacco Use  .  Smoking status: Never Smoker  . Smokeless tobacco: Never Used  Substance and Sexual Activity  . Alcohol use: No    Alcohol/week: 0.0 oz  . Drug use: No  . Sexual activity: Not Currently  Lifestyle  . Physical activity:    Days per week: Not on file    Minutes per session: Not on file  . Stress: Not on file  Relationships  . Social connections:    Talks on phone: Not on file    Gets together: Not on file    Attends religious service: Not on file    Active member of club or organization: Not on file    Attends meetings of clubs or organizations: Not on file    Relationship status: Not on file  . Intimate partner violence:    Fear of current or ex partner: Not on file    Emotionally  abused: Not on file    Physically abused: Not on file    Forced sexual activity: Not on file  Other Topics Concern  . Not on file  Social History Narrative   Widowed.  College-educated for years plus.  Self-employed/retired.   Takes a daily vitamin.   Drinks caffeine.   Smoke alarm in the home.   Wears a seatbelt.   Allergies as of 12/28/2017   No Known Allergies     Medication List        Accurate as of 12/28/17  5:04 PM. Always use your most recent med list.          amLODipine 10 MG tablet Commonly known as:  NORVASC Take 1 tablet (10 mg total) by mouth daily.   aspirin 81 MG tablet Take 81 mg by mouth daily.   Omega-3 1000 MG Caps Take by mouth.   SYNTHROID 100 MCG tablet Generic drug:  levothyroxine Take 1 tablet by mouth daily.   Vitamin D3 5000 units Caps Take 1 capsule by mouth.       All past medical history, surgical history, allergies, family history, immunizations andmedications were updated in the EMR today and reviewed under the history and medication portions of their EMR.    No results found for this or any previous visit (from the past 2160 hour(s)).  No results found.   ROS: 14 pt review of systems performed and negative (unless mentioned in an HPI)  Objective: BP (!) 146/83 (BP Location: Right Arm, Patient Position: Sitting, Cuff Size: Normal)   Pulse 80   Temp 98.4 F (36.9 C)   Resp 20   Ht 5' (1.524 m)   Wt 142 lb (64.4 kg)   SpO2 97%   BMI 27.73 kg/m  Gen: Afebrile. No acute distress. Nontoxic in appearance, well-developed, well-nourished, very pleasant Caucasian female. HENT: AT. [place].  MMM Eyes:Pupils Equal Round Reactive to light, Extraocular movements intact,  Conjunctiva without redness, discharge or icterus. Neck/lymp/endocrine: Supple, no lymphadenopathy, no thyromegaly CV: RRR no murmur, no edema, +2/4 P posterior tibialis pulses.  No carotid bruits. No JVD. Chest: CTAB, no wheeze, rhonchi or crackles.  Normal respiratory  effort.  Good air movement. Abd: Soft. NTND. BS present. Neuro/Msk:  Normal gait. PERLA. EOMi. Alert. Oriented x3.   Psych: Normal affect, dress and demeanor. Normal speech. Normal thought content and judgment.   Assessment/plan: AKIBA MELFI is a 82 y.o. female present for  Essential hypertension/CKD 3 Borderline today.  By EMR review she has been borderline the majority of her visits over the last 4 years. -Continue amlodipine 10  mg daily.  She is having some swelling of her lower extremities with the increased dose but it is not bothering her and resolves with elevation. -We will update BMP for kidney function.  Consider adding ACEi/ARB for better blood pressure control. -Avoid HCTZ, use of that medication because decrease in her kidney function. - Basic Metabolic Panel (BMET)--> every 6 mos. - CBC., lipids next visit if not collected by endocrine.  -CKD 3, 4 5 every 6-12 months PTH/calcium/phosphorus/vitamin D. - f/u 2 weeks with med change.   Hypothyroidism, unspecified type Follows with endocrinology.  Osteoporosis, unspecified osteoporosis type, unspecified pathological fracture presence -Continue vitamin D supplementation.  DEXA screen due this year.    Return in about 2 weeks (around 01/11/2018). > 25 minutes spent with patient, >50% of time spent face to face counseling and coordinating care.     Note is dictated utilizing voice recognition software. Although note has been proof read prior to signing, occasional typographical errors still can be missed. If any questions arise, please do not hesitate to call for verification.  Electronically signed by: Howard Pouch, DO [place]

## 2017-12-29 LAB — BASIC METABOLIC PANEL
BUN: 38 mg/dL — ABNORMAL HIGH (ref 6–23)
CO2: 22 mEq/L (ref 19–32)
Calcium: 9 mg/dL (ref 8.4–10.5)
Chloride: 107 mEq/L (ref 96–112)
Creatinine, Ser: 1.24 mg/dL — ABNORMAL HIGH (ref 0.40–1.20)
GFR: 44.19 mL/min — ABNORMAL LOW (ref >60.00)
Glucose, Bld: 94 mg/dL (ref 70–99)
Potassium: 4.5 mEq/L (ref 3.5–5.1)
Sodium: 138 mEq/L (ref 135–145)

## 2017-12-30 ENCOUNTER — Telehealth: Payer: Self-pay | Admitting: Family Medicine

## 2017-12-30 MED ORDER — LISINOPRIL 10 MG PO TABS: 10.0000 mg | ORAL_TABLET | Freq: Every day | ORAL | 0 refills | Status: DC

## 2017-12-30 NOTE — Telephone Encounter (Signed)
Spoke with patient reviewed lab results and instructions. Patient verbalized understanding. Scheduled patient for follow up appt.

## 2017-12-30 NOTE — Telephone Encounter (Signed)
Please inform patient the following information: Her kidney function has mildly improved since her last collection in 2017 with prior PCP. -We discussed increasing her blood pressure regimen, and I would suggest for starting lisinopril 10 mg qd, and  decrease her amlodipine to 5 mg (1/2 tab of her current pill) daily since she is having swelling at the higher dose. -Follow-up in 1-2 week after making changes above, with a provider appointment so we can recheck her blood pressure, taper her medications and also I would like her to be fasting, so that we can check her cholesterol and kidney function after starting new medication.

## 2018-01-01 ENCOUNTER — Ambulatory Visit: Payer: PPO | Admitting: Family Medicine

## 2018-01-05 DIAGNOSIS — E890 Postprocedural hypothyroidism: Secondary | ICD-10-CM | POA: Diagnosis not present

## 2018-01-08 ENCOUNTER — Encounter: Payer: Self-pay | Admitting: Family Medicine

## 2018-01-08 ENCOUNTER — Ambulatory Visit (INDEPENDENT_AMBULATORY_CARE_PROVIDER_SITE_OTHER): Payer: PPO | Admitting: Family Medicine

## 2018-01-08 VITALS — BP 158/92 | HR 90 | Temp 98.2°F | Resp 20 | Ht 60.0 in | Wt 141.2 lb

## 2018-01-08 DIAGNOSIS — N183 Chronic kidney disease, stage 3 unspecified: Secondary | ICD-10-CM

## 2018-01-08 DIAGNOSIS — I10 Essential (primary) hypertension: Secondary | ICD-10-CM | POA: Diagnosis not present

## 2018-01-08 MED ORDER — BLOOD PRESSURE KIT DEVI: 1.0000 | Freq: Every day | 0 refills | Status: DC

## 2018-01-08 MED ORDER — AMLODIPINE BESYLATE 5 MG PO TABS: 5.0000 mg | ORAL_TABLET | Freq: Every day | ORAL | 1 refills | Status: DC

## 2018-01-08 NOTE — Progress Notes (Signed)
Patient ID: Paula Haynes, female  DOB: 05/30/1936, 82 y.o.   MRN: [id number] Patient Care Team    Relationship Specialty Notifications Start End  Ma Hillock, DO PCP - General Family Medicine  12/28/17   Jacelyn Pi, MD Consulting Physician Endocrinology  11/16/17     Chief Complaint  Patient presents with  . Hypertension    Subjective:  Paula Haynes is a 82 y.o.  female present for follow up Hypertension/CKD: Pt reports compliance with amlodipine 5 mg daily and lisinopril 10 mg QD (medications altered last week). Blood pressures ranges at home not routinely checked. Patient denies chest pain, shortness of breath, dizziness or lower extremity edema.  Pt takes a  daily baby ASA. Pt is  Not prescribed statin.  She takes omega-3 1000 mg a day. Diet: No routine diet.  Patient states she eats anything she wants. Exercise: No routine exercise, but she is very active in her house and in her yard BMP Latest Ref Rng & Units 12/28/2017 06/17/2016 02/15/2016  Glucose 70 - 99 mg/dL 94 110(H) 106(H)  BUN 6 - 23 mg/dL 38(H) 40(H) 36(H)  Creatinine 0.40 - 1.20 mg/dL 1.24(H) 1.42(H) 1.39(H)  Sodium 135 - 145 mEq/L 138 140 140  Potassium 3.5 - 5.1 mEq/L 4.5 3.3(L) 3.7  Chloride 96 - 112 mEq/L 107 100 102  CO2 19 - 32 mEq/L '22 26 30  ' Calcium 8.4 - 10.5 mg/dL 9.0 9.4 10.2   No flowsheet data found.  Lipid Panel  No results found for: CHOL, TRIG, HDL, CHOLHDL, VLDL, LDLCALC, LDLDIRECT    Depression screen [facility] 2/9 12/28/2017 11/16/2017 02/15/2016 05/19/2014 02/09/2013  Decreased Interest 0 1 0 0 0  Down, Depressed, Hopeless 0 0 0 0 0  PHQ - 2 Score 0 1 0 0 0   No flowsheet data found.    Fall Risk  12/28/2017 11/16/2017 02/15/2016 05/19/2014 02/09/2013  Falls in the past year? No No No No No     Immunization History  Administered Date(s) Administered  . Influenza Split 05/02/2011  . Influenza, High Dose Seasonal PF 06/17/2016  . Influenza,inj,Quad PF,6+ Mos 05/19/2014  .  Pneumococcal Conjugate-13 06/17/2016  . Pneumococcal Polysaccharide-23 01/22/2012  . Zoster 01/22/2012    No exam data present  Past Medical History:  Diagnosis Date  . Arthritis   . History of blood product transfusion   . Hypertension   . Thyroid disease    No Known Allergies Past Surgical History:  Procedure Laterality Date  . FINGER SURGERY     right middle finger/due to glass window cutting finger  . REFRACTIVE SURGERY     bil   Family History  Problem Relation Age of Onset  . Heart disease Father   . Arthritis Unknown        parent  . Hypertension Unknown        parent  . Stroke Unknown        parent   Social History   Socioeconomic History  . Marital status: Married    Spouse name: Not on file  . Number of children: Not on file  . Years of education: Not on file  . Highest education level: Not on file  Occupational History  . Not on file  Social Needs  . Financial resource strain: Not on file  . Food insecurity:    Worry: Not on file    Inability: Not on file  . Transportation needs:    Medical: Not on file  Non-medical: Not on file  Tobacco Use  . Smoking status: Never Smoker  . Smokeless tobacco: Never Used  Substance and Sexual Activity  . Alcohol use: No    Alcohol/week: 0.0 oz  . Drug use: No  . Sexual activity: Not Currently  Lifestyle  . Physical activity:    Days per week: Not on file    Minutes per session: Not on file  . Stress: Not on file  Relationships  . Social connections:    Talks on phone: Not on file    Gets together: Not on file    Attends religious service: Not on file    Active member of club or organization: Not on file    Attends meetings of clubs or organizations: Not on file    Relationship status: Not on file  . Intimate partner violence:    Fear of current or ex partner: Not on file    Emotionally abused: Not on file    Physically abused: Not on file    Forced sexual activity: Not on file  Other Topics  Concern  . Not on file  Social History Narrative   Widowed.  College-educated for years plus.  Self-employed/retired.   Takes a daily vitamin.   Drinks caffeine.   Smoke alarm in the home.   Wears a seatbelt.   Allergies as of 01/08/2018   No Known Allergies     Medication List        Accurate as of 01/08/18  4:47 PM. Always use your most recent med list.          amLODipine 5 MG tablet Commonly known as:  NORVASC Take 1 tablet (5 mg total) by mouth daily.   aspirin 81 MG tablet Take 81 mg by mouth daily.   Blood Pressure Kit Devi 1 Device by Does not apply route daily. Large cuff   lisinopril 10 MG tablet Commonly known as:  PRINIVIL,ZESTRIL Take 1 tablet (10 mg total) by mouth daily.   Omega-3 1000 MG Caps Take by mouth.   SYNTHROID 100 MCG tablet Generic drug:  levothyroxine Take 1 tablet by mouth daily.   Vitamin D3 5000 units Caps Take 1 capsule by mouth.       All past medical history, surgical history, allergies, family history, immunizations andmedications were updated in the EMR today and reviewed under the history and medication portions of their EMR.     No results found.   ROS: 14 pt review of systems performed and negative (unless mentioned in an HPI)  Objective: BP (!) 158/92   Pulse 90   Temp 98.2 F (36.8 C)   Resp 20   Ht 5' (1.524 m)   Wt 141 lb 4 oz (64.1 kg)   SpO2 97%   BMI 27.59 kg/m  Gen: Afebrile. No acute distress. Nontoxic, very pleasant caucasian female.  HENT: AT. .MMM. Eyes:Pupils Equal Round Reactive to light, Extraocular movements intact,  Conjunctiva without redness, discharge or icterus. Neck/lymp/endocrine: Supple,no lymphadenopathy, no thyromegaly CV: RRR no murmur, no edema, +2/4 P posterior tibialis pulses Chest: CTAB, no wheeze or crackles Abd: Soft. NTND. BS present. no Masses palpated.  Skin: no rashes, purpura or petechiae.  Neuro:  Normal gait. PERLA. EOMi. Alert. Oriented x3  Psych: Normal affect,  dress and demeanor. Normal speech. Normal thought content and judgment.   Assessment/plan: Paula Haynes is a 82 y.o. female present for  Essential hypertension/CKD 3 - Elevated BP today, but edema resolved completely with lower dose  amlodipine.  - Continue amlodipine 5 mg  mg daily (swelling with increased dose).   - Increase Lisinopril 10--> 20 mg QD.  - Avoid HCTZ, use of that medication because decrease in her kidney function. - Basic Metabolic Panel (BMET) - CBC, bmp since starting ace.   - TSH - LDL -CKD 3,  PTH/calcium/phosphorus/vitamin D. - f/u 1 week w/ nurse visit for Bp recheck.    Return in about 1 week (around 01/15/2018) for nurse visit.   Note is dictated utilizing voice recognition software. Although note has been proof read prior to signing, occasional typographical errors still can be missed. If any questions arise, please do not hesitate to call for verification.  Electronically signed by: Howard Pouch, DO [place]

## 2018-01-08 NOTE — Patient Instructions (Signed)
Keep amlodipine at 5 mg (1/2 tab).  Start lisinopril 20 mg a day (2 tabs of current med) Follow up for nurse visit 1 week for BP recheck.

## 2018-01-11 ENCOUNTER — Encounter: Payer: Self-pay | Admitting: Family Medicine

## 2018-01-11 ENCOUNTER — Encounter: Payer: Self-pay | Admitting: Podiatry

## 2018-01-11 ENCOUNTER — Ambulatory Visit (INDEPENDENT_AMBULATORY_CARE_PROVIDER_SITE_OTHER): Payer: PPO | Admitting: Podiatry

## 2018-01-11 ENCOUNTER — Telehealth: Payer: Self-pay | Admitting: Family Medicine

## 2018-01-11 VITALS — BP 158/89 | HR 84

## 2018-01-11 DIAGNOSIS — M79675 Pain in left toe(s): Secondary | ICD-10-CM

## 2018-01-11 DIAGNOSIS — R52 Pain, unspecified: Secondary | ICD-10-CM

## 2018-01-11 DIAGNOSIS — Q828 Other specified congenital malformations of skin: Secondary | ICD-10-CM

## 2018-01-11 DIAGNOSIS — B351 Tinea unguium: Secondary | ICD-10-CM | POA: Diagnosis not present

## 2018-01-11 DIAGNOSIS — M216X1 Other acquired deformities of right foot: Secondary | ICD-10-CM

## 2018-01-11 DIAGNOSIS — L578 Other skin changes due to chronic exposure to nonionizing radiation: Secondary | ICD-10-CM | POA: Diagnosis not present

## 2018-01-11 DIAGNOSIS — L570 Actinic keratosis: Secondary | ICD-10-CM | POA: Diagnosis not present

## 2018-01-11 DIAGNOSIS — M79674 Pain in right toe(s): Secondary | ICD-10-CM | POA: Diagnosis not present

## 2018-01-11 DIAGNOSIS — M216X2 Other acquired deformities of left foot: Secondary | ICD-10-CM | POA: Diagnosis not present

## 2018-01-11 LAB — CBC WITH DIFFERENTIAL/PLATELET
Basophils Absolute: 42 cells/uL (ref 0–200)
Basophils Relative: 0.8 %
Eosinophils Absolute: 68 cells/uL (ref 15–500)
Eosinophils Relative: 1.3 %
HCT: 34.2 % — ABNORMAL LOW (ref 35.0–45.0)
Hemoglobin: 11.5 g/dL — ABNORMAL LOW (ref 11.7–15.5)
Lymphs Abs: 681 cells/uL — ABNORMAL LOW (ref 850–3900)
MCH: 29.6 pg (ref 27.0–33.0)
MCHC: 33.6 g/dL (ref 32.0–36.0)
MCV: 88.1 fL (ref 80.0–100.0)
MPV: 12 fL (ref 7.5–12.5)
Monocytes Relative: 7.9 %
Neutro Abs: 3999 cells/uL (ref 1500–7800)
Neutrophils Relative %: 76.9 %
Platelets: 170 10*3/uL (ref 140–400)
RBC: 3.88 10*6/uL (ref 3.80–5.10)
RDW: 13.2 % (ref 11.0–15.0)
Total Lymphocyte: 13.1 %
WBC mixed population: 411 cells/uL (ref 200–950)
WBC: 5.2 10*3/uL (ref 3.8–10.8)

## 2018-01-11 LAB — BASIC METABOLIC PANEL
BUN/Creatinine Ratio: 32 (calc) — ABNORMAL HIGH (ref 6–22)
BUN: 44 mg/dL — ABNORMAL HIGH (ref 7–25)
CO2: 18 mmol/L — ABNORMAL LOW (ref 20–32)
Calcium: 9.6 mg/dL (ref 8.6–10.4)
Chloride: 107 mmol/L (ref 98–110)
Creat: 1.37 mg/dL — ABNORMAL HIGH (ref 0.60–0.88)
Glucose, Bld: 100 mg/dL — ABNORMAL HIGH (ref 65–99)
Potassium: 5.1 mmol/L (ref 3.5–5.3)
Sodium: 137 mmol/L (ref 135–146)

## 2018-01-11 LAB — PARATHYROID HORMONE, INTACT (NO CA): PTH: 58 pg/mL (ref 14–64)

## 2018-01-11 LAB — VITAMIN D 25 HYDROXY (VIT D DEFICIENCY, FRACTURES): Vit D, 25-Hydroxy: 48 ng/mL (ref 30–100)

## 2018-01-11 LAB — LDL CHOLESTEROL, DIRECT: Direct LDL: 99 mg/dL (ref <100)

## 2018-01-11 MED ORDER — LISINOPRIL 10 MG PO TABS: 20.0000 mg | ORAL_TABLET | Freq: Every day | ORAL | 0 refills | Status: DC

## 2018-01-11 NOTE — Telephone Encounter (Signed)
Left detailed message with results and instructions on patient voice mail per [facility] faxed records request to Dr Almetta Lovely office.

## 2018-01-11 NOTE — Telephone Encounter (Signed)
Please inform patient the following information: Her hemoglobin  Is very mildly low, which could be her normal, but we have nothing to compare it to. It would be VERY mild anemia, and would just need monitoring every 6 mos until proven stable. No worries at this time unless noticing bleeding (ie: rectal etc).   Paula Haynes: Please make sure we have requested any labs completed at Dr. Chalmers Cater office, they may have a CBC we can compare result.

## 2018-01-11 NOTE — Progress Notes (Signed)
This patient presents the office for multiple problems on both feet.  Patient states that her nails have grown long and thick and she desires the nails to be treated at this visit.  She also has a painful callus under her right forefoot which is tender walking and wearing her shoes.  She also says her arch falls and flattens when she stands.   She presents to the office for evaluation and treatment of her feet.  General Appearance  Alert, conversant and in no acute stress.  Vascular  Dorsalis pedis and posterior tibial  pulses are palpable  bilaterally.  Capillary return is within normal limits  bilaterally. Temperature is within normal limits  bilaterally.  Neurologic  Senn-Weinstein monofilament wire test within normal limits  bilaterally. Muscle power within normal limits bilaterally.  Nails Thick disfigured discolored nails with subungual debris  from hallux to fifth toes bilaterally. No evidence of bacterial infection or drainage bilaterally.  Orthopedic  No limitations of motion of motion feet .  No crepitus or effusions noted.  Excessive pronation feet  B/l.  Skin  normotropic skin noted bilaterally.  No signs of infections or ulcers noted.  Porokeratosis sub 3 right foot.  Onychomycosis  B/L  Porokeratosis right forefoot.  Excessive pronation feet  B/L  IE.  Debride nails.  Debride porokeratosis.  Powerstep insoles were prescribed.  RTC prn   Gardiner Barefoot DPM

## 2018-01-15 ENCOUNTER — Ambulatory Visit (INDEPENDENT_AMBULATORY_CARE_PROVIDER_SITE_OTHER): Payer: PPO

## 2018-01-15 VITALS — BP 136/72 | HR 74

## 2018-01-15 DIAGNOSIS — I10 Essential (primary) hypertension: Secondary | ICD-10-CM

## 2018-01-15 NOTE — Progress Notes (Signed)
Patient presents today for blood pressure check. BP 136/72, HR 74, after waiting 10 minutes. Patient stated she is doing well and feeling good.

## 2018-01-19 NOTE — Progress Notes (Signed)
BP check good, no changes in mgmt.

## 2018-02-17 ENCOUNTER — Telehealth: Payer: Self-pay | Admitting: Family Medicine

## 2018-02-17 NOTE — Telephone Encounter (Signed)
Patient said that when she was in the office on 6/4 that Dr Raoul Pitch changed her dosage on her lisinopril from 10mg  to 20mg  and the amlodipine from 10mg  to 5mg . Please advise.

## 2018-02-17 NOTE — Telephone Encounter (Signed)
Copied from [place] [id number]. Topic: Quick Communication - Rx Refill/Question >> Feb 17, 2018 12:06 PM Waylan Rocher, Lumin L wrote: Medication: lisinopril 20mg  and amlodipine 10mg  (please call patient to clarify scripts as she's thinks he was given the wrong dosage and she is out of her medicines)  Has the patient contacted their pharmacy? Yes.   (Agent: If no, request that the patient contact the pharmacy for the refill.) (Agent: If yes, when and what did the pharmacy advise?)  Preferred Pharmacy (with phone number or street name): [facility], [place] - [address] [place] [address] [place] [address] Phone: [phone] Fax: [phone] (pharmacy tried to contact office multiple times)   Agent: Please be advised that RX refills may take up to 3 business days. We ask that you follow-up with your pharmacy.

## 2018-02-17 NOTE — Telephone Encounter (Signed)
Called patient left message for patient to return call 

## 2018-02-18 MED ORDER — LISINOPRIL 20 MG PO TABS: 20.0000 mg | ORAL_TABLET | Freq: Every day | ORAL | 1 refills | Status: DC

## 2018-02-18 NOTE — Telephone Encounter (Signed)
I do not understand the issue completely.   She was seen for HTN 01/08/2018--> medications were altered to  Continue amlodipine 5 mg  mg daily- however the pill was changed to the 5 mg dose per pill so she was to take one a day--> she was prescribed enough for 6 months LAST MONTH. If this is the med she states is not there ... It is, but maybe different script number (she is probably trying to call in the 10 mg pill #). Call pharmacy and call pt to clarify. - last appt we also Increased Lisinopril 10--> 20 mg QD. She is correct and she should need a refill on this medication--> I have refilled  the lisinopril 20 mg pill- MAKE sure she only takes one a day since the dose per pill is 20 mg now.   Read the label instruction to her on each of these meds to make sure she understands.

## 2018-02-18 NOTE — Telephone Encounter (Signed)
Spoke with patient clarified dosing instructions let her know Lisinopril sent in as new dose of 20mg  so take 1 tab daily and amlodipine sent as 5mg  dose 1 tab daily. Patient verbalized understanding.

## 2018-04-16 ENCOUNTER — Encounter: Payer: Self-pay | Admitting: Family Medicine

## 2018-04-20 DIAGNOSIS — Z0000 Encounter for general adult medical examination without abnormal findings: Secondary | ICD-10-CM | POA: Diagnosis not present

## 2018-04-26 ENCOUNTER — Other Ambulatory Visit: Payer: Self-pay

## 2018-04-26 NOTE — Patient Outreach (Signed)
[facility]) Care Management  04/26/2018  Paula Haynes 1935/10/07 [id number]  TELEPHONE SCREENING Referral date: 04/16/18 Referral source: Health team advantage referral Referral reason: transportation assistance. Insurance: Health team advantage Attempt #1   Telephone call to patient regarding referral. Unable to reach patient. HIPAA compliant voice message left with call back phone number.   PLAN: RNCM will attempt 2nd telephone call to patient within 4 business days. RNCM will send outreach letter.   Quinn Plowman RN,BSN, [place] Telephonic  [phone]

## 2018-04-29 ENCOUNTER — Other Ambulatory Visit: Payer: Self-pay

## 2018-04-29 NOTE — Patient Outreach (Signed)
[facility]) Care Management  04/29/2018  CLORIS FLIPPO 1936/07/12 [id number]  TELEPHONE SCREENING Referral date: 04/16/18 Referral source: Health team advantage referral Referral reason: transportation assistance. Insurance: Health team advantage   Telephone call to patient regarding health team advantage. HIPAA verified. Explained reason for call. Patient states she does not need transportation at this time.  Patient reports she is able to drive herself.  Patient denied any further needs. RNCM offered to send patient [facility] management brochure. Patient verbally agreed.   PLAN:  PLAN; RNCM will close patient due to refusal of services.  RNCM will send patient [facility] care management brochure / magnet RNCM will send patients primary MD closure notification   Quinn Plowman RN,BSN,CCM [facility] Telephonic  [phone]

## 2018-05-11 DIAGNOSIS — L570 Actinic keratosis: Secondary | ICD-10-CM | POA: Diagnosis not present

## 2018-05-11 DIAGNOSIS — L908 Other atrophic disorders of skin: Secondary | ICD-10-CM | POA: Diagnosis not present

## 2018-05-25 DIAGNOSIS — M810 Age-related osteoporosis without current pathological fracture: Secondary | ICD-10-CM | POA: Diagnosis not present

## 2018-07-13 ENCOUNTER — Ambulatory Visit: Payer: PPO | Admitting: Family Medicine

## 2018-07-16 ENCOUNTER — Ambulatory Visit: Payer: PPO | Admitting: Family Medicine

## 2018-07-23 ENCOUNTER — Ambulatory Visit (INDEPENDENT_AMBULATORY_CARE_PROVIDER_SITE_OTHER): Payer: PPO | Admitting: Family Medicine

## 2018-07-23 ENCOUNTER — Encounter: Payer: Self-pay | Admitting: Family Medicine

## 2018-07-23 VITALS — BP 160/88 | HR 74 | Temp 98.1°F | Resp 16 | Ht 60.0 in | Wt 138.0 lb

## 2018-07-23 DIAGNOSIS — T7840XA Allergy, unspecified, initial encounter: Secondary | ICD-10-CM

## 2018-07-23 DIAGNOSIS — N183 Chronic kidney disease, stage 3 unspecified: Secondary | ICD-10-CM

## 2018-07-23 DIAGNOSIS — I10 Essential (primary) hypertension: Secondary | ICD-10-CM

## 2018-07-23 DIAGNOSIS — D638 Anemia in other chronic diseases classified elsewhere: Secondary | ICD-10-CM

## 2018-07-23 DIAGNOSIS — Z23 Encounter for immunization: Secondary | ICD-10-CM | POA: Diagnosis not present

## 2018-07-23 DIAGNOSIS — M545 Low back pain, unspecified: Secondary | ICD-10-CM

## 2018-07-23 NOTE — Patient Instructions (Addendum)
Nurse visit in 1 week for BP recheck with nurse on both medicines. Make sure to take your blood pressure medicine at least 2 hours before your appt.  Avoid high sodium meals- sausage, ham, bacon  etc.   We will call you with labs once we receive the results  Start an allergy medicine before bed- Claritin or allegra before bed. You can try mucinex DM before bed as well.  6 mos f/u with provider for Hypertension    Hypertension Hypertension, commonly called high blood pressure, is when the force of blood pumping through the arteries is too strong. The arteries are the blood vessels that carry blood from the heart throughout the body. Hypertension forces the heart to work harder to pump blood and may cause arteries to become narrow or stiff. Having untreated or uncontrolled hypertension can cause heart attacks, strokes, kidney disease, and other problems. A blood pressure reading consists of a higher number over a lower number. Ideally, your blood pressure should be below 120/80. The first ("top") number is called the systolic pressure. It is a measure of the pressure in your arteries as your heart beats. The second ("bottom") number is called the diastolic pressure. It is a measure of the pressure in your arteries as the heart relaxes. What are the causes? The cause of this condition is not known. What increases the risk? Some risk factors for high blood pressure are under your control. Others are not. Factors you can change  Smoking.  Having type 2 diabetes mellitus, high cholesterol, or both.  Not getting enough exercise or physical activity.  Being overweight.  Having too much fat, sugar, calories, or salt (sodium) in your diet.  Drinking too much alcohol. Factors that are difficult or impossible to change  Having chronic kidney disease.  Having a family history of high blood pressure.  Age. Risk increases with age.  Race. You may be at higher risk if you are  African-American.  Gender. Men are at higher risk than women before age 49. After age 66, women are at higher risk than men.  Having obstructive sleep apnea.  Stress. What are the signs or symptoms? Extremely high blood pressure (hypertensive crisis) may cause:  Headache.  Anxiety.  Shortness of breath.  Nosebleed.  Nausea and vomiting.  Severe chest pain.  Jerky movements you cannot control (seizures). How is this diagnosed? This condition is diagnosed by measuring your blood pressure while you are seated, with your arm resting on a surface. The cuff of the blood pressure monitor will be placed directly against the skin of your upper arm at the level of your heart. It should be measured at least twice using the same arm. Certain conditions can cause a difference in blood pressure between your right and left arms. Certain factors can cause blood pressure readings to be lower or higher than normal (elevated) for a short period of time:  When your blood pressure is higher when you are in a health care provider's office than when you are at home, this is called white coat hypertension. Most people with this condition do not need medicines.  When your blood pressure is higher at home than when you are in a health care provider's office, this is called masked hypertension. Most people with this condition may need medicines to control blood pressure. If you have a high blood pressure reading during one visit or you have normal blood pressure with other risk factors:  You may be asked to return on  a different day to have your blood pressure checked again.  You may be asked to monitor your blood pressure at home for 1 week or longer. If you are diagnosed with hypertension, you may have other blood or imaging tests to help your health care provider understand your overall risk for other conditions. How is this treated? This condition is treated by making healthy lifestyle changes, such as  eating healthy foods, exercising more, and reducing your alcohol intake. Your health care provider may prescribe medicine if lifestyle changes are not enough to get your blood pressure under control, and if:  Your systolic blood pressure is above 130.  Your diastolic blood pressure is above 80. Your personal target blood pressure may vary depending on your medical conditions, your age, and other factors. Follow these instructions at home: Eating and drinking   Eat a diet that is high in fiber and potassium, and low in sodium, added sugar, and fat. An example eating plan is called the DASH (Dietary Approaches to Stop Hypertension) diet. To eat this way: ? Eat plenty of fresh fruits and vegetables. Try to fill half of your plate at each meal with fruits and vegetables. ? Eat whole grains, such as whole wheat pasta, brown rice, or whole grain bread. Fill about one quarter of your plate with whole grains. ? Eat or drink low-fat dairy products, such as skim milk or low-fat yogurt. ? Avoid fatty cuts of meat, processed or cured meats, and poultry with skin. Fill about one quarter of your plate with lean proteins, such as fish, chicken without skin, beans, eggs, and tofu. ? Avoid premade and processed foods. These tend to be higher in sodium, added sugar, and fat.  Reduce your daily sodium intake. Most people with hypertension should eat less than 1,500 mg of sodium a day.  Limit alcohol intake to no more than 1 drink a day for nonpregnant women and 2 drinks a day for men. One drink equals 12 oz of beer, 5 oz of wine, or 1 oz of hard liquor. Lifestyle   Work with your health care provider to maintain a healthy body weight or to lose weight. Ask what an ideal weight is for you.  Get at least 30 minutes of exercise that causes your heart to beat faster (aerobic exercise) most days of the week. Activities may include walking, swimming, or biking.  Include exercise to strengthen your muscles  (resistance exercise), such as pilates or lifting weights, as part of your weekly exercise routine. Try to do these types of exercises for 30 minutes at least 3 days a week.  Do not use any products that contain nicotine or tobacco, such as cigarettes and e-cigarettes. If you need help quitting, ask your health care provider.  Monitor your blood pressure at home as told by your health care provider.  Keep all follow-up visits as told by your health care provider. This is important. Medicines  Take over-the-counter and prescription medicines only as told by your health care provider. Follow directions carefully. Blood pressure medicines must be taken as prescribed.  Do not skip doses of blood pressure medicine. Doing this puts you at risk for problems and can make the medicine less effective.  Ask your health care provider about side effects or reactions to medicines that you should watch for. Contact a health care provider if:  You think you are having a reaction to a medicine you are taking.  You have headaches that keep coming back (recurring).  You feel dizzy.  You have swelling in your ankles.  You have trouble with your vision. Get help right away if:  You develop a severe headache or confusion.  You have unusual weakness or numbness.  You feel faint.  You have severe pain in your chest or abdomen.  You vomit repeatedly.  You have trouble breathing. Summary  Hypertension is when the force of blood pumping through your arteries is too strong. If this condition is not controlled, it may put you at risk for serious complications.  Your personal target blood pressure may vary depending on your medical conditions, your age, and other factors. For most people, a normal blood pressure is less than 120/80.  Hypertension is treated with lifestyle changes, medicines, or a combination of both. Lifestyle changes include weight loss, eating a healthy, low-sodium diet, exercising  more, and limiting alcohol. This information is not intended to replace advice given to you by your health care provider. Make sure you discuss any questions you have with your health care provider. Document Released: 07/14/2005 Document Revised: 06/11/2016 Document Reviewed: 06/11/2016 Elsevier Interactive Patient Education  2019 [employer].

## 2018-07-23 NOTE — Progress Notes (Signed)
Patient ID: Paula Haynes, female  DOB: [birth year], 82 y.o.   MRN: [id number] Patient Care Team    Relationship Specialty Notifications Start End  Ma Hillock, DO PCP - General Family Medicine  12/28/17   Jacelyn Pi, MD Consulting Physician Endocrinology  11/16/17     Chief Complaint  Patient presents with  . Follow-up    HTN    Subjective:  Paula Haynes is a 82 y.o.  female present for follow up Hypertension/CKD3/mild anemia: Pt reports compliance with amlodipine 5 mg daily and lisinopril 20 mg QD. Blood pressures ranges at home not routinely checked. She took her medicine about 1 hour ago. Patient denies chest pain, shortness of breath, dizziness or lower extremity edema.  Pt takes a  daily baby ASA. Pt is  Not prescribed statin.  She takes omega-3 1000 mg a day. Diet: No routine diet.  Patient states she eats anything she wants. Exercise: No routine exercise, but she is very active in her house and in her yard  Chronic left lumbar pain:  She has been doing more work outside. Right lumbar region bothers her after working. She had seen a doctor about a year ago, that gave her a back brace for when lifting. She has CKD 3- NSAIDS not encouraged.   Allergies:  Runny nose. Mostly at night when laying flat. Has a mild cough. Using cough drops. No fever, chills, nausea or vomit.   BMP Latest Ref Rng & Units 01/08/2018 12/28/2017 06/17/2016  Glucose 65 - 99 mg/dL 100(H) 94 110(H)  BUN 7 - 25 mg/dL 44(H) 38(H) 40(H)  Creatinine 0.60 - 0.88 mg/dL 1.37(H) 1.24(H) 1.42(H)  BUN/Creat Ratio 6 - 22 (calc) 32(H) - -  Sodium 135 - 146 mmol/L 137 138 140  Potassium 3.5 - 5.3 mmol/L 5.1 4.5 3.3(L)  Chloride 98 - 110 mmol/L 107 107 100  CO2 20 - 32 mmol/L 18(L) 22 26  Calcium 8.6 - 10.4 mg/dL 9.6 9.0 9.4   CBC Latest Ref Rng & Units 01/08/2018  WBC 3.8 - 10.8 Thousand/uL 5.2  Hemoglobin 11.7 - 15.5 g/dL 11.5(L)  Hematocrit 35.0 - 45.0 % 34.2(L)  Platelets 140 - 400 Thousand/uL  170    Lipid Panel     Component Value Date/Time   LDLDIRECT 99 01/08/2018 1536    Depression screen PHQ 2/9 12/28/2017 11/16/2017 02/15/2016 05/19/2014 02/09/2013  Decreased Interest 0 1 0 0 0  Down, Depressed, Hopeless 0 0 0 0 0  PHQ - 2 Score 0 1 0 0 0   No flowsheet data found.    Fall Risk  12/28/2017 11/16/2017 02/15/2016 05/19/2014 02/09/2013  Falls in the past year? No No No No No    Immunization History  Administered Date(s) Administered  . Influenza Split 05/02/2011  . Influenza, High Dose Seasonal PF 06/17/2016, 07/23/2018  . Influenza,inj,Quad PF,6+ Mos 05/19/2014  . Pneumococcal Conjugate-13 06/17/2016  . Pneumococcal Polysaccharide-23 01/22/2012  . Zoster 01/22/2012    No exam data present  Past Medical History:  Diagnosis Date  . Arthritis   . History of blood product transfusion   . Hypertension   . Thyroid disease    No Known Allergies Past Surgical History:  Procedure Laterality Date  . FINGER SURGERY     right middle finger/due to glass window cutting finger  . REFRACTIVE SURGERY     bil   Family History  Problem Relation Age of Onset  . Heart disease Father   . Arthritis Unknown  parent  . Hypertension Unknown        parent  . Stroke Unknown        parent   Social History   Socioeconomic History  . Marital status: Married    Spouse name: Not on file  . Number of children: Not on file  . Years of education: Not on file  . Highest education level: Not on file  Occupational History  . Not on file  Social Needs  . Financial resource strain: Not on file  . Food insecurity:    Worry: Not on file    Inability: Not on file  . Transportation needs:    Medical: Not on file    Non-medical: Not on file  Tobacco Use  . Smoking status: Never Smoker  . Smokeless tobacco: Never Used  Substance and Sexual Activity  . Alcohol use: No    Alcohol/week: 0.0 standard drinks  . Drug use: No  . Sexual activity: Not Currently  Lifestyle  .  Physical activity:    Days per week: Not on file    Minutes per session: Not on file  . Stress: Not on file  Relationships  . Social connections:    Talks on phone: Not on file    Gets together: Not on file    Attends religious service: Not on file    Active member of club or organization: Not on file    Attends meetings of clubs or organizations: Not on file    Relationship status: Not on file  . Intimate partner violence:    Fear of current or ex partner: Not on file    Emotionally abused: Not on file    Physically abused: Not on file    Forced sexual activity: Not on file  Other Topics Concern  . Not on file  Social History Narrative   Widowed.  College-educated for years plus.  Self-employed/retired.   Takes a daily vitamin.   Drinks caffeine.   Smoke alarm in the home.   Wears a seatbelt.   Allergies as of 07/23/2018   No Known Allergies     Medication List       Accurate as of July 23, 2018  3:31 PM. Always use your most recent med list.        amLODipine 5 MG tablet Commonly known as:  NORVASC Take 1 tablet (5 mg total) by mouth daily.   aspirin 81 MG tablet Take 81 mg by mouth daily.   Blood Pressure Kit Devi 1 Device by Does not apply route daily. Large cuff   lisinopril 20 MG tablet Commonly known as:  PRINIVIL,ZESTRIL Take 1 tablet (20 mg total) by mouth daily.   Omega-3 1000 MG Caps Take by mouth.   SYNTHROID 100 MCG tablet Generic drug:  levothyroxine Take 1 tablet by mouth daily.   Vitamin D3 125 MCG (5000 UT) Caps Take 1 capsule by mouth.       All past medical history, surgical history, allergies, family history, immunizations andmedications were updated in the EMR today and reviewed under the history and medication portions of their EMR.     No results found.   ROS: 14 pt review of systems performed and negative (unless mentioned in an HPI)  Objective: BP (!) 160/88 (BP Location: Left Arm, Patient Position: Sitting, Cuff  Size: Normal)   Pulse 74   Temp 98.1 F (36.7 C) (Oral)   Resp 16   Ht 5' (1.524 m)   Wt 138  lb (62.6 kg)   SpO2 99%   BMI 26.95 kg/m  Gen: Afebrile. No acute distress. Nontoxic in appearance.  HENT: AT. [place]. MMM.  Eyes:Pupils Equal Round Reactive to light, Extraocular movements intact,  Conjunctiva without redness, discharge or icterus. CV: RRR 1/6, no edema, +2/4 P posterior tibialis pulses Chest: CTAB, no wheeze or crackles Abd: Soft.NTND. BS present Skin: no rashes, purpura or petechiae.  Neuro:  Normal gait. PERLA. EOMi. Alert. Oriented x3 Psych: Normal affect, dress and demeanor. Normal speech. Normal thought content and judgment.   Assessment/plan: Paula Haynes is a 82 y.o. female present for  Essential hypertension/CKD 3/mild anemia chronic disease -  Elevated BP today- she had just taken her BP medicine and  reports she ate more sodium containing meals over the holidays. Will have her come in for nurse visit 1 week- on meds at least 2 hours before appt. In the meantime hydrate with water. Low sodium meals.  - Continue amlodipine 5 mg  mg daily (swelling with increased dose).   - continue  Lisinopril  20 mg QD. If needed will increase after nurse visit.  - Avoid HCTZ, use of that medication because decrease in her kidney function. - BMP 01/08/2018 Cr 1.37, GFR ~40--> labs collected today - CBC 01/08/2018 hgb/hct  11.5/34.2 --> labs collected today - LDL 01/08/2018 < 100 - PTH/calcium/phosphorus/vitamin D every 6-12 months   - UTD 01/08/2018: PTH 58/ca 9.6/vit d 48 - F/u q 6 mos. Need for vaccination Flu shot administered today.  Lumbar pain Chronic right lower lumbar pain with activity. Has seen specialist. NSAIDS not desired with CKD3.  - tylenol if needed. - Ambulatory referral to Physical Therapy Allergic state, initial encounter No signs of infection on exam today Start Claritin or allegra before bed.  Could also try mucinex DM before bed.   1 week nurse visit  for BP recheck.   Return in about 6 months (around 01/22/2019) for HTN/CKD.   Note is dictated utilizing voice recognition software. Although note has been proof read prior to signing, occasional typographical errors still can be missed. If any questions arise, please do not hesitate to call for verification.  Electronically signed by: Howard Pouch, DO [place]

## 2018-07-24 LAB — CBC
HCT: 33.8 % — ABNORMAL LOW (ref 35.0–45.0)
Hemoglobin: 11.1 g/dL — ABNORMAL LOW (ref 11.7–15.5)
MCH: 29.3 pg (ref 27.0–33.0)
MCHC: 32.8 g/dL (ref 32.0–36.0)
MCV: 89.2 fL (ref 80.0–100.0)
MPV: 12.9 fL — ABNORMAL HIGH (ref 7.5–12.5)
Platelets: 132 10*3/uL — ABNORMAL LOW (ref 140–400)
RBC: 3.79 10*6/uL — ABNORMAL LOW (ref 3.80–5.10)
RDW: 12.6 % (ref 11.0–15.0)
WBC: 4.6 10*3/uL (ref 3.8–10.8)

## 2018-07-24 LAB — BASIC METABOLIC PANEL
BUN/Creatinine Ratio: 28 (calc) — ABNORMAL HIGH (ref 6–22)
BUN: 36 mg/dL — ABNORMAL HIGH (ref 7–25)
CO2: 19 mmol/L — ABNORMAL LOW (ref 20–32)
Calcium: 9.4 mg/dL (ref 8.6–10.4)
Chloride: 111 mmol/L — ABNORMAL HIGH (ref 98–110)
Creat: 1.27 mg/dL — ABNORMAL HIGH (ref 0.60–0.88)
Glucose, Bld: 83 mg/dL (ref 65–99)
Potassium: 5 mmol/L (ref 3.5–5.3)
Sodium: 139 mmol/L (ref 135–146)

## 2018-07-26 ENCOUNTER — Encounter: Payer: Self-pay | Admitting: Family Medicine

## 2018-07-26 ENCOUNTER — Telehealth: Payer: Self-pay | Admitting: Family Medicine

## 2018-07-26 DIAGNOSIS — D649 Anemia, unspecified: Secondary | ICD-10-CM

## 2018-07-26 DIAGNOSIS — D6869 Other thrombophilia: Secondary | ICD-10-CM | POA: Insufficient documentation

## 2018-07-26 DIAGNOSIS — D72810 Lymphocytopenia: Secondary | ICD-10-CM

## 2018-07-26 DIAGNOSIS — D696 Thrombocytopenia, unspecified: Secondary | ICD-10-CM

## 2018-07-26 NOTE — Telephone Encounter (Signed)
Pt notified and stated that she has an appt next Friday for a nurse visit, advised that she needs to push as much water/fluids as she can but needs to at least get 60oz of water in a day. Pt states that she knows she doesn't drink as much fluids as she should but she will try to get in at least 60oz. Advised pt when she comes in for her nurse visit we need to draw blood as well to recheck and see if she's getting enough fluids in. Pt voiced understanding and will plan on returning to the office next Friday.Pt advised to call the office if she has any concerns or questions.

## 2018-07-26 NOTE — Telephone Encounter (Signed)
Please inform patient the following information: Her blood work sis have some changes this time- with a cell call platelets being lower than normal- which is new for her.  Her kidney function is stable from 6 months ago.  I would recommend she work on staying hydrated- water at least 60 ounces a day.  Please schedule her for a lab visit on the day she is scheduled for a BP nurse visit next week for recheck. I would like to verify the platelet count with repeat so we can determine next step.

## 2018-07-27 DIAGNOSIS — H524 Presbyopia: Secondary | ICD-10-CM | POA: Diagnosis not present

## 2018-07-30 ENCOUNTER — Ambulatory Visit: Payer: PPO

## 2018-07-30 ENCOUNTER — Other Ambulatory Visit: Payer: PPO

## 2018-08-04 ENCOUNTER — Other Ambulatory Visit: Payer: PPO

## 2018-08-04 NOTE — Addendum Note (Signed)
Addended by: Ralph Dowdy on: 08/04/2018 10:32 AM   Modules accepted: Orders

## 2018-08-05 ENCOUNTER — Other Ambulatory Visit (INDEPENDENT_AMBULATORY_CARE_PROVIDER_SITE_OTHER): Payer: PPO | Admitting: Family Medicine

## 2018-08-05 DIAGNOSIS — D696 Thrombocytopenia, unspecified: Secondary | ICD-10-CM

## 2018-08-05 DIAGNOSIS — D649 Anemia, unspecified: Secondary | ICD-10-CM | POA: Diagnosis not present

## 2018-08-05 DIAGNOSIS — D72810 Lymphocytopenia: Secondary | ICD-10-CM

## 2018-08-05 MED ORDER — LISINOPRIL 20 MG PO TABS: 20.0000 mg | ORAL_TABLET | Freq: Every day | ORAL | 1 refills | Status: DC

## 2018-08-05 MED ORDER — AMLODIPINE BESYLATE 5 MG PO TABS: 5.0000 mg | ORAL_TABLET | Freq: Every day | ORAL | 1 refills | Status: DC

## 2018-08-05 NOTE — Addendum Note (Signed)
Addended by: Howard Pouch A on: 08/05/2018 11:45 AM   Modules accepted: Orders

## 2018-08-05 NOTE — Progress Notes (Signed)
Patient advised to take BP medication 2 hours prior to visit.  Appt scheduled 01/03/2019 @ 3pm.

## 2018-08-05 NOTE — Progress Notes (Addendum)
Paula Haynes is a 83 y.o. female presents to the office today for Blood pressure recheck secondary to elevated BP in office.  She also had  labs drawn today. Blood pressure medication:  Lisinopril 20 mg and amlodipine 5mg . If on medication, Last dose was at least 1-2 hours prior to recheck: No  Patient took her medication at 10:30am - presented to office at 11am for recheck.  Blood pressure was taken in the left arm after patient rested for 5 minutes.   BP 140/88 (BP Location: Left Arm, Patient Position: Sitting, Cuff Size: Normal)   Pulse 80    Patient was concerned about BP being still some elevated.  I gave patient another 5 minutes to sit and rest before rechecking BP.   Second BP readings after 5 more minutes of resting. BP  140/ 80 Left arm, sitting, normal size cuff.  Pulse 72.      Jacklynn Ganong, CMA  ----------------------------------------------------------------------------------------------  BP is better than prior but still borderline. Keep medication same for now. I refilled her meds. Follow up 6 months--> please stress to her to make sure she takes her blood pressure med AT LEAST 2 hours BEFORE that appt so we can get an accurate reading.   Medical screening examination/treatment/procedure(s) were performed by non-physician practitioner and as supervising physician I was immediately available for consultation/collaboration.  I agree with above assessment and plan.  Electronically Signed by: Howard Pouch, DO [facility]

## 2018-08-06 LAB — CBC WITH DIFFERENTIAL/PLATELET
Absolute Monocytes: 464 cells/uL (ref 200–950)
Basophils Absolute: 39 cells/uL (ref 0–200)
Basophils Relative: 0.9 %
Eosinophils Absolute: 69 cells/uL (ref 15–500)
Eosinophils Relative: 1.6 %
HCT: 34.4 % — ABNORMAL LOW (ref 35.0–45.0)
Hemoglobin: 11.3 g/dL — ABNORMAL LOW (ref 11.7–15.5)
Lymphs Abs: 1092 cells/uL (ref 850–3900)
MCH: 29.9 pg (ref 27.0–33.0)
MCHC: 32.8 g/dL (ref 32.0–36.0)
MCV: 91 fL (ref 80.0–100.0)
MPV: 11.8 fL (ref 7.5–12.5)
Monocytes Relative: 10.8 %
Neutro Abs: 2636 cells/uL (ref 1500–7800)
Neutrophils Relative %: 61.3 %
Platelets: 212 10*3/uL (ref 140–400)
RBC: 3.78 10*6/uL — ABNORMAL LOW (ref 3.80–5.10)
RDW: 12.7 % (ref 11.0–15.0)
Total Lymphocyte: 25.4 %
WBC: 4.3 10*3/uL (ref 3.8–10.8)

## 2018-08-06 LAB — PATHOLOGIST SMEAR REVIEW

## 2018-08-09 ENCOUNTER — Telehealth: Payer: Self-pay

## 2018-08-09 ENCOUNTER — Telehealth: Payer: Self-pay | Admitting: Family Medicine

## 2018-08-09 NOTE — Telephone Encounter (Signed)
Pt. Given results and instructions. Verbalizes understanding. 

## 2018-08-09 NOTE — Telephone Encounter (Signed)
Pt returned call 08/09/2018 9:50am. NT not available. Please call back at [phone].  Copied from [place] [id number]. Topic: Quick Communication - Lab Results (Clinic Use ONLY) >> Aug 09, 2018  9:30 AM Chrisandra Carota, CMA wrote: Faythe Ghee for [facility] to discuss lab results w pt when she calls back.

## 2018-08-11 DIAGNOSIS — M545 Low back pain: Secondary | ICD-10-CM | POA: Diagnosis not present

## 2018-08-12 ENCOUNTER — Telehealth: Payer: Self-pay | Admitting: Family Medicine

## 2018-08-12 NOTE — Telephone Encounter (Signed)
Signed and faxed back.

## 2018-08-12 NOTE — Telephone Encounter (Signed)
Physical Therapy order in provider's box for signature.

## 2018-08-13 DIAGNOSIS — M545 Low back pain: Secondary | ICD-10-CM | POA: Diagnosis not present

## 2018-08-17 DIAGNOSIS — M545 Low back pain: Secondary | ICD-10-CM | POA: Diagnosis not present

## 2018-08-24 DIAGNOSIS — M545 Low back pain: Secondary | ICD-10-CM | POA: Diagnosis not present

## 2018-08-26 DIAGNOSIS — M545 Low back pain: Secondary | ICD-10-CM | POA: Diagnosis not present

## 2018-11-29 DIAGNOSIS — M810 Age-related osteoporosis without current pathological fracture: Secondary | ICD-10-CM | POA: Diagnosis not present

## 2018-12-21 ENCOUNTER — Other Ambulatory Visit: Payer: Self-pay

## 2018-12-21 ENCOUNTER — Encounter: Payer: Self-pay | Admitting: Podiatry

## 2018-12-21 ENCOUNTER — Ambulatory Visit: Payer: PPO | Admitting: Podiatry

## 2018-12-21 DIAGNOSIS — L821 Other seborrheic keratosis: Secondary | ICD-10-CM | POA: Diagnosis not present

## 2018-12-21 DIAGNOSIS — B351 Tinea unguium: Secondary | ICD-10-CM

## 2018-12-21 DIAGNOSIS — M79676 Pain in unspecified toe(s): Secondary | ICD-10-CM

## 2018-12-21 DIAGNOSIS — M79609 Pain in unspecified limb: Secondary | ICD-10-CM

## 2018-12-21 DIAGNOSIS — Q828 Other specified congenital malformations of skin: Secondary | ICD-10-CM

## 2018-12-21 DIAGNOSIS — L570 Actinic keratosis: Secondary | ICD-10-CM | POA: Diagnosis not present

## 2018-12-21 DIAGNOSIS — I8392 Asymptomatic varicose veins of left lower extremity: Secondary | ICD-10-CM | POA: Diagnosis not present

## 2018-12-23 NOTE — Progress Notes (Signed)
    Subjective: Patient is a 83 y.o. female presenting to the office today for follow up evaluation of painful callus lesions noted to the bilateral feet. Walking and bearing weight increases the pain. She has not done anything for treatment.  Patient also complains of elongated, thickened nails that cause pain while ambulating in shoes. She is unable to trim her own nails. Patient presents today for further treatment and evaluation.  Past Medical History:  Diagnosis Date  . Arthritis   . History of blood product transfusion   . Hypertension   . Thyroid disease     Objective:  Physical Exam General: Alert and oriented x3 in no acute distress  Dermatology: Hyperkeratotic lesions present on the bilateral feet. Pain on palpation with a central nucleated core noted. Skin is warm, dry and supple bilateral lower extremities. Negative for open lesions or macerations. Nails are tender, long, thickened and dystrophic with subungual debris, consistent with onychomycosis, 1-5 bilateral. No signs of infection noted.  Vascular: Palpable pedal pulses bilaterally. No edema or erythema noted. Capillary refill within normal limits.  Neurological: Epicritic and protective threshold grossly intact bilaterally.   Musculoskeletal Exam: Pain on palpation at the keratotic lesion noted. Range of motion within normal limits bilateral. Muscle strength 5/5 in all groups bilateral.  Assessment: 1. Onychodystrophic nails 1-5 bilateral with hyperkeratosis of nails.  2. Onychomycosis of nail due to dermatophyte bilateral 3. Porokeratosis noted to the bilateral feet x 2   Plan of Care:  1. Patient evaluated. 2. Excisional debridement of keratoic lesion using a chisel blade was performed without incident.  3. Dressed with light dressing. 4. Mechanical debridement of nails 1-5 bilaterally performed using a nail nipper. Filed with dremel without incident.  5. Patient is to return to the clinic as needed.   Edrick Kins, DPM [facility]  Dr. Edrick Kins, [address]                                        [place], [place] [address]                Office [phone]  Fax [phone]

## 2019-01-04 ENCOUNTER — Ambulatory Visit: Payer: PPO | Admitting: Family Medicine

## 2019-02-07 ENCOUNTER — Telehealth: Payer: Self-pay

## 2019-02-07 MED ORDER — LISINOPRIL 20 MG PO TABS: 20.0000 mg | ORAL_TABLET | Freq: Every day | ORAL | 0 refills | Status: DC

## 2019-02-07 NOTE — Telephone Encounter (Signed)
rcvd fax refill request for lisinopril 20mg  tablets from [facility]. Patient called and does not have enough medication to last until 7/28 appt. Refill sent to pharm. To last until appt

## 2019-02-22 ENCOUNTER — Other Ambulatory Visit: Payer: Self-pay

## 2019-02-22 ENCOUNTER — Ambulatory Visit (INDEPENDENT_AMBULATORY_CARE_PROVIDER_SITE_OTHER): Payer: PPO | Admitting: Family Medicine

## 2019-02-22 ENCOUNTER — Encounter: Payer: Self-pay | Admitting: Family Medicine

## 2019-02-22 DIAGNOSIS — I10 Essential (primary) hypertension: Secondary | ICD-10-CM

## 2019-02-22 DIAGNOSIS — I129 Hypertensive chronic kidney disease with stage 1 through stage 4 chronic kidney disease, or unspecified chronic kidney disease: Secondary | ICD-10-CM | POA: Diagnosis not present

## 2019-02-22 DIAGNOSIS — E039 Hypothyroidism, unspecified: Secondary | ICD-10-CM | POA: Diagnosis not present

## 2019-02-22 DIAGNOSIS — N183 Chronic kidney disease, stage 3 unspecified: Secondary | ICD-10-CM

## 2019-02-22 DIAGNOSIS — D649 Anemia, unspecified: Secondary | ICD-10-CM

## 2019-02-22 MED ORDER — LISINOPRIL 20 MG PO TABS: 20.0000 mg | ORAL_TABLET | Freq: Every day | ORAL | 1 refills | Status: DC

## 2019-02-22 MED ORDER — AMLODIPINE BESYLATE 5 MG PO TABS: 5.0000 mg | ORAL_TABLET | Freq: Every day | ORAL | 1 refills | Status: DC

## 2019-02-22 NOTE — Progress Notes (Signed)
Telephone visit.   I connected with Paula Haynes on 02/22/19 at  3:00 PM EDT by telemedicine  and verified that I am speaking with the correct person using two identifiers. Location patient: Home Location provider: [facility], Office Persons participating in the virtual visit: Patient, Dr. Raoul Pitch and R.Baker, LPN  I discussed the limitations of evaluation and management by telemedicine and the availability of in person appointments. The patient expressed understanding and agreed to proceed.  Patient ID: Paula Haynes, female  DOB: [birth year], 83 y.o.   MRN: [id number] Patient Care Team    Relationship Specialty Notifications Start End  Ma Hillock, DO PCP - General Family Medicine  12/28/17   Jacelyn Pi, MD Consulting Physician Endocrinology  11/16/17     Chief Complaint  Patient presents with  . Hypertension    medication refill. patient does not check bp everyday. patient took medication today. she said that she is having issues getting her Vitamin D medication.     Subjective:  Paula Haynes is a 83 y.o.  female present for follow up Hypertension/CKD3/mild anemia: Pt reports compliance with amlodipine 5 mg daily and lisinopril 20 mg QD. Blood pressures ranges at home not routinely checked. Patient denies chest pain, shortness of breath, dizziness or lower extremity edema.  Pt takes a  daily baby ASA. Pt is  Not prescribed statin.  She takes omega-3 1000 mg a day. Diet: No routine diet.  Patient states she eats anything she wants. Exercise: No routine exercise, but she is very active in her house and in her yard.  Depression screen [facility] 2/9 02/22/2019 12/28/2017 11/16/2017 02/15/2016 05/19/2014  Decreased Interest 0 0 1 0 0  Down, Depressed, Hopeless 0 0 0 0 0  PHQ - 2 Score 0 0 1 0 0   No flowsheet data found.    Fall Risk  12/28/2017 11/16/2017 02/15/2016 05/19/2014 02/09/2013  Falls in the past year? No No No No No    Immunization History  Administered  Date(s) Administered  . Influenza Split 05/02/2011  . Influenza, High Dose Seasonal PF 06/17/2016, 07/23/2018  . Influenza,inj,Quad PF,6+ Mos 05/19/2014  . Pneumococcal Conjugate-13 06/17/2016  . Pneumococcal Polysaccharide-23 01/22/2012  . Zoster 01/22/2012    No exam data present  Past Medical History:  Diagnosis Date  . Arthritis   . History of blood product transfusion   . Hypertension   . Thyroid disease    No Known Allergies Past Surgical History:  Procedure Laterality Date  . FINGER SURGERY     right middle finger/due to glass window cutting finger  . REFRACTIVE SURGERY     bil   Family History  Problem Relation Age of Onset  . Heart disease Father   . Arthritis Other        parent  . Hypertension Other        parent  . Stroke Other        parent   Social History   Socioeconomic History  . Marital status: Married    Spouse name: Not on file  . Number of children: Not on file  . Years of education: Not on file  . Highest education level: Not on file  Occupational History  . Not on file  Social Needs  . Financial resource strain: Not on file  . Food insecurity    Worry: Not on file    Inability: Not on file  . Transportation needs    Medical: Not on  file    Non-medical: Not on file  Tobacco Use  . Smoking status: Never Smoker  . Smokeless tobacco: Never Used  Substance and Sexual Activity  . Alcohol use: No    Alcohol/week: 0.0 standard drinks  . Drug use: No  . Sexual activity: Not Currently  Lifestyle  . Physical activity    Days per week: Not on file    Minutes per session: Not on file  . Stress: Not on file  Relationships  . Social Herbalist on phone: Not on file    Gets together: Not on file    Attends religious service: Not on file    Active member of club or organization: Not on file    Attends meetings of clubs or organizations: Not on file    Relationship status: Not on file  . Intimate partner violence    Fear of  current or ex partner: Not on file    Emotionally abused: Not on file    Physically abused: Not on file    Forced sexual activity: Not on file  Other Topics Concern  . Not on file  Social History Narrative   Widowed.  College-educated for years plus.  Self-employed/retired.   Takes a daily vitamin.   Drinks caffeine.   Smoke alarm in the home.   Wears a seatbelt.   Allergies as of 02/22/2019   No Known Allergies     Medication List       Accurate as of February 22, 2019  3:20 PM. If you have any questions, ask your nurse or doctor.        amLODipine 5 MG tablet Commonly known as: NORVASC Take 1 tablet (5 mg total) by mouth daily.   aspirin 81 MG tablet Take 81 mg by mouth daily.   Blood Pressure Kit Devi 1 Device by Does not apply route daily. Large cuff   lisinopril 20 MG tablet Commonly known as: ZESTRIL Take 1 tablet (20 mg total) by mouth daily.   Omega-3 1000 MG Caps Take by mouth.   Synthroid 100 MCG tablet Generic drug: levothyroxine Take 1 tablet by mouth daily.   Vitamin D3 125 MCG (5000 UT) Caps Take 1 capsule by mouth.       All past medical history, surgical history, allergies, family history, immunizations andmedications were updated in the EMR today and reviewed under the history and medication portions of their EMR.     No results found.   ROS: 14 pt review of systems performed and negative (unless mentioned in an HPI)  Objective: There were no vitals taken for this visit. Gen: Afebrile. No acute distress.  CV: no edema Chest: no cough or shortness of breath  Neuro:  Alert. Oriented.  Psych: Normal affect, dress and demeanor. Normal speech. Normal thought content and judgment.   Assessment/plan: Paula Haynes is a 83 y.o. female present for  Essential hypertension/CKD 3/mild anemia  -  Stable when checked by insurance nurse. 130/80 (but she states the nurse said 140- but wrote 130).  - continue current regimen amlodipine 5 mg QD and  lisinopril 20 mg QD.  -  hydrate with water. Low sodium meals.  - CBC, CMP, iron panel ordered today - Avoid HCTZ, use of that medication because decrease in her kidney function. - BMP 01/08/2018 Cr 1.37, GFR ~40--> labs collected today - CBC 01/08/2018 hgb/hct  11.5/34.2 --> labs collected today - LDL 01/08/2018 < 100 - PTH/calcium/phosphorus/vitamin D every 6-12 months   -  UTD 01/08/2018: PTH 58/ca 9.6/vit d 48 - F/u q 6 mos.  Hypothyroidism: - TSH collected today - meds refilled by her endocrinologist which she states she has not been able to see this year.   Return in about 6 months (around 08/25/2019) for [place] (30 min).  > 25 minutes spent with patient, >50% of time spent face to face   Orders Placed This Encounter  Procedures  . Comp Met (CMET)    Standing Status:   Future    Standing Expiration Date:   02/22/2020  . TSH    Standing Status:   Future    Standing Expiration Date:   02/22/2020  . Iron, TIBC and Ferritin Panel    Standing Status:   Future    Standing Expiration Date:   02/22/2020  . CBC w/Diff    Standing Status:   Future    Standing Expiration Date:   02/22/2020     Note is dictated utilizing voice recognition software. Although note has been proof read prior to signing, occasional typographical errors still can be missed. If any questions arise, please do not hesitate to call for verification.  Electronically signed by: Howard Pouch, DO [place]

## 2019-02-25 ENCOUNTER — Telehealth: Payer: Self-pay | Admitting: Family Medicine

## 2019-02-25 ENCOUNTER — Ambulatory Visit (INDEPENDENT_AMBULATORY_CARE_PROVIDER_SITE_OTHER): Payer: PPO | Admitting: Family Medicine

## 2019-02-25 ENCOUNTER — Encounter: Payer: Self-pay | Admitting: Family Medicine

## 2019-02-25 ENCOUNTER — Other Ambulatory Visit: Payer: Self-pay

## 2019-02-25 DIAGNOSIS — D649 Anemia, unspecified: Secondary | ICD-10-CM

## 2019-02-25 DIAGNOSIS — N183 Chronic kidney disease, stage 3 unspecified: Secondary | ICD-10-CM

## 2019-02-25 DIAGNOSIS — I10 Essential (primary) hypertension: Secondary | ICD-10-CM | POA: Diagnosis not present

## 2019-02-25 DIAGNOSIS — E039 Hypothyroidism, unspecified: Secondary | ICD-10-CM

## 2019-02-25 MED ORDER — LISINOPRIL 30 MG PO TABS: 30.0000 mg | ORAL_TABLET | Freq: Every day | ORAL | 1 refills | Status: DC

## 2019-02-25 NOTE — Addendum Note (Signed)
Addended by: Ralph Dowdy on: 02/25/2019 02:58 PM   Modules accepted: Orders

## 2019-02-25 NOTE — Telephone Encounter (Signed)
error 

## 2019-02-25 NOTE — Progress Notes (Signed)
Pt was called and given information. She verbalized understanding. Appt was made.

## 2019-02-25 NOTE — Addendum Note (Signed)
Addended by: Howard Pouch A on: 02/25/2019 04:15 PM   Modules accepted: Orders

## 2019-02-25 NOTE — Progress Notes (Addendum)
   KIMISHA EUNICE is a 83 y.o. female presents to the office today for Blood pressure recheck secondary to VV dated 02/22/2019 and verbal orders Blood pressure medication: lisinopril 20mg , amlodipine 5mg  If on medication, Last dose was at least 1-2 hours prior to recheck: Yes Blood pressure was taken in the left arm after patient rested for 10 minutes.  BP (!) 156/88 (BP Location: Left Arm, Patient Position: Sitting, Cuff Size: Normal)   Pulse 73   SpO2 100%   Discussed low sodium diet and at home monitoring daily.   Lattie Haw, CMA  ---------------------------------------------------------------------  Please inform patient the following information: Her BP is higher than desired. I have called in a very mildly higher dose lisinopril for her. Lisinopril 30 mg tab QD. If she has not picked up her new script yet- please call pharmacy and tell them to fill the 30 mg tab (not the 20 mg tab- which should be DCd).  If she did already pcik up that script- tell her to finish what she has by taking 1.5 tabs of the 20 mg lisinopril until she picks up new bottle.   TOTAL DOSE lisinopril 30 mg now.   followup in 4 week  in person to recheck with provider appt. >>please schedule  Electronically Signed by: Howard Pouch, DO [place] [facility]

## 2019-02-26 LAB — IRON,TIBC AND FERRITIN PANEL
%SAT: 23 % (calc) (ref 16–45)
Ferritin: 38 ng/mL (ref 16–288)
Iron: 73 ug/dL (ref 45–160)
TIBC: 324 mcg/dL (calc) (ref 250–450)

## 2019-02-26 LAB — CBC WITH DIFFERENTIAL/PLATELET
Absolute Monocytes: 621 cells/uL (ref 200–950)
Basophils Absolute: 51 cells/uL (ref 0–200)
Basophils Relative: 0.9 %
Eosinophils Absolute: 120 cells/uL (ref 15–500)
Eosinophils Relative: 2.1 %
HCT: 36 % (ref 35.0–45.0)
Hemoglobin: 11.9 g/dL (ref 11.7–15.5)
Lymphs Abs: 1796 cells/uL (ref 850–3900)
MCH: 30.1 pg (ref 27.0–33.0)
MCHC: 33.1 g/dL (ref 32.0–36.0)
MCV: 91.1 fL (ref 80.0–100.0)
MPV: 12.6 fL — ABNORMAL HIGH (ref 7.5–12.5)
Monocytes Relative: 10.9 %
Neutro Abs: 3112 cells/uL (ref 1500–7800)
Neutrophils Relative %: 54.6 %
Platelets: 242 10*3/uL (ref 140–400)
RBC: 3.95 10*6/uL (ref 3.80–5.10)
RDW: 12.9 % (ref 11.0–15.0)
Total Lymphocyte: 31.5 %
WBC: 5.7 10*3/uL (ref 3.8–10.8)

## 2019-02-26 LAB — COMPREHENSIVE METABOLIC PANEL
AG Ratio: 1 (calc) (ref 1.0–2.5)
ALT: 8 U/L (ref 6–29)
AST: 18 U/L (ref 10–35)
Albumin: 4.2 g/dL (ref 3.6–5.1)
Alkaline phosphatase (APISO): 40 U/L (ref 37–153)
BUN/Creatinine Ratio: 27 (calc) — ABNORMAL HIGH (ref 6–22)
BUN: 39 mg/dL — ABNORMAL HIGH (ref 7–25)
CO2: 18 mmol/L — ABNORMAL LOW (ref 20–32)
Calcium: 10 mg/dL (ref 8.6–10.4)
Chloride: 107 mmol/L (ref 98–110)
Creat: 1.46 mg/dL — ABNORMAL HIGH (ref 0.60–0.88)
Globulin: 4.1 g/dL (calc) — ABNORMAL HIGH (ref 1.9–3.7)
Glucose, Bld: 95 mg/dL (ref 65–99)
Potassium: 5.4 mmol/L — ABNORMAL HIGH (ref 3.5–5.3)
Sodium: 137 mmol/L (ref 135–146)
Total Bilirubin: 0.2 mg/dL (ref 0.2–1.2)
Total Protein: 8.3 g/dL — ABNORMAL HIGH (ref 6.1–8.1)

## 2019-02-26 LAB — TSH: TSH: 0.46 mIU/L (ref 0.40–4.50)

## 2019-02-28 ENCOUNTER — Telehealth: Payer: Self-pay | Admitting: Family Medicine

## 2019-02-28 DIAGNOSIS — E8809 Other disorders of plasma-protein metabolism, not elsewhere classified: Secondary | ICD-10-CM | POA: Insufficient documentation

## 2019-02-28 DIAGNOSIS — R779 Abnormality of plasma protein, unspecified: Secondary | ICD-10-CM

## 2019-02-28 DIAGNOSIS — E875 Hyperkalemia: Secondary | ICD-10-CM

## 2019-02-28 HISTORY — DX: Other disorders of plasma-protein metabolism, not elsewhere classified: E88.09

## 2019-02-28 NOTE — Telephone Encounter (Signed)
Please inform patient the following information: Her iron is normal, blood counts are stable and thyroid is functioning in normal range.  Her kidney function is  decreased from her normal. Still in  The same chronic kidney stage 3 range as prior but mildly worsening.   - she also has elevated protein in her blood, this is new for her.  There are certain kidney disorders that can present like this and we need to evaluate her for them.  - Encourage her to hydrate- plenty of water.  - schedule lab appt for 1 week and move her follow up from 8/28 to 8/14 please.      - please send her thyroid panel to her endocrine for their records. Dr. Chalmers Cater.

## 2019-03-01 NOTE — Telephone Encounter (Signed)
Pt was called and given results. Appts were made for labs and for 08/14. Pt verbalized understanding

## 2019-03-08 ENCOUNTER — Telehealth: Payer: Self-pay

## 2019-03-08 ENCOUNTER — Other Ambulatory Visit: Payer: Self-pay

## 2019-03-08 ENCOUNTER — Other Ambulatory Visit: Payer: Self-pay | Admitting: Family Medicine

## 2019-03-08 ENCOUNTER — Ambulatory Visit (INDEPENDENT_AMBULATORY_CARE_PROVIDER_SITE_OTHER): Payer: PPO

## 2019-03-08 DIAGNOSIS — E8809 Other disorders of plasma-protein metabolism, not elsewhere classified: Secondary | ICD-10-CM

## 2019-03-08 DIAGNOSIS — E875 Hyperkalemia: Secondary | ICD-10-CM

## 2019-03-08 DIAGNOSIS — R779 Abnormality of plasma protein, unspecified: Secondary | ICD-10-CM

## 2019-03-08 NOTE — Telephone Encounter (Signed)
Pt came for lab appt only and dropped off latest BP readings for Dr Raoul Pitch to review. Pt  Has appt 03/11/2019 in office. Readings placed on Dr Lucita Lora desk for review

## 2019-03-09 LAB — COMPREHENSIVE METABOLIC PANEL
ALT: 10 U/L (ref 0–35)
AST: 20 U/L (ref 0–37)
Albumin: 4.3 g/dL (ref 3.5–5.2)
Alkaline Phosphatase: 44 U/L (ref 39–117)
BUN: 40 mg/dL — ABNORMAL HIGH (ref 6–23)
CO2: 19 mEq/L (ref 19–32)
Calcium: 9.2 mg/dL (ref 8.4–10.5)
Chloride: 106 mEq/L (ref 96–112)
Creatinine, Ser: 1.36 mg/dL — ABNORMAL HIGH (ref 0.40–1.20)
GFR: 37.26 mL/min — ABNORMAL LOW (ref >60.00)
Glucose, Bld: 92 mg/dL (ref 70–99)
Potassium: 5.2 mEq/L — ABNORMAL HIGH (ref 3.5–5.1)
Sodium: 136 mEq/L (ref 135–145)
Total Bilirubin: 0.3 mg/dL (ref 0.2–1.2)
Total Protein: 8.2 g/dL (ref 6.0–8.3)

## 2019-03-11 ENCOUNTER — Encounter: Payer: Self-pay | Admitting: Family Medicine

## 2019-03-11 ENCOUNTER — Ambulatory Visit (INDEPENDENT_AMBULATORY_CARE_PROVIDER_SITE_OTHER): Payer: PPO | Admitting: Family Medicine

## 2019-03-11 ENCOUNTER — Other Ambulatory Visit: Payer: Self-pay

## 2019-03-11 VITALS — BP 138/89 | HR 90 | Temp 98.2°F | Resp 17 | Ht 60.0 in | Wt 134.5 lb

## 2019-03-11 DIAGNOSIS — E663 Overweight: Secondary | ICD-10-CM

## 2019-03-11 DIAGNOSIS — E875 Hyperkalemia: Secondary | ICD-10-CM

## 2019-03-11 DIAGNOSIS — R779 Abnormality of plasma protein, unspecified: Secondary | ICD-10-CM

## 2019-03-11 DIAGNOSIS — I10 Essential (primary) hypertension: Secondary | ICD-10-CM | POA: Diagnosis not present

## 2019-03-11 DIAGNOSIS — E8809 Other disorders of plasma-protein metabolism, not elsewhere classified: Secondary | ICD-10-CM | POA: Diagnosis not present

## 2019-03-11 DIAGNOSIS — N183 Chronic kidney disease, stage 3 unspecified: Secondary | ICD-10-CM

## 2019-03-11 MED ORDER — AMLODIPINE BESYLATE 2.5 MG PO TABS: 2.5000 mg | ORAL_TABLET | Freq: Every day | ORAL | 1 refills | Status: DC

## 2019-03-11 NOTE — Patient Instructions (Signed)
Increase amlodipine to 7.5 mg total daily. Continue your 5 mg tab and we added the 2.5 mg pill also.  Continue your lisinopril 30 mg a day.  Avoid bananas for a month, then you can eat one a week if you want.  Stay away from high potassium foods.     Food Basics for Chronic Kidney Disease When your kidneys are not working well, they cannot remove waste and excess substances from your blood as effectively as they did before. This can lead to a buildup and imbalance of these substances, which can worsen kidney damage and affect how your body functions. Certain foods lead to a buildup of these substances in the body. By changing your diet as recommended by your diet and nutrition specialist (dietitian) or health care provider, you could help prevent further kidney damage and delay or prevent the need for dialysis. What are tips for following this plan? General instructions   Work with your health care provider and dietitian to develop a meal plan that is right for you. Foods you can eat, limit, or avoid will be different for each person depending on the stage of kidney disease and any other existing health conditions.  Talk with your health care provider about whether you should take a vitamin and mineral supplement.  Use standard measuring cups and spoons to measure servings of foods. Use a kitchen scale to measure portions of protein foods.  If directed by your health care provider, avoid drinking too much fluid. Measure and count all liquids, including water, ice, soups, flavored gelatin, and frozen desserts such as popsicles or ice cream. Reading food labels  Check the amount of sodium in foods. Choose foods that have less than 300 milligrams (mg) per serving.  Check the ingredient list for phosphorus or potassium-based additives or preservatives.  Check the amount of saturated and trans fat. Limit or avoid these fats as told by your dietitian. Shopping  Avoid buying foods that are: ?  Processed, frozen, or prepackaged. ? Calcium-enriched or fortified.  Do not buy foods that have salt or sodium listed among the first five ingredients.  Do not buy canned vegetables. Cooking  Replace animal proteins, such as meat, fish, eggs, or dairy, with plant proteins from beans, nuts, and soy. ? Use soy milk instead of cow's milk. ? Add beans or tofu to soups, casseroles, or pasta dishes instead of meat.  Soak vegetables, such as potatoes, before cooking to reduce potassium. To do this: ? Peel and cut into small pieces. ? Soak in warm water for at least 2 hours. For every 1 cup of vegetables, use 10 cups of water. ? Drain and rinse with warm water. ? Boil for at least 5 minutes. Meal planning  Limit the amount of protein from plant and animal sources you eat each day.  Do not add salt to food when cooking or before eating.  Eat meals and snacks at around the same time each day. If you have diabetes:  If you have diabetes (diabetes mellitus) and chronic kidney disease, it is important to keep your blood glucose in the target range recommended by your health care provider. Follow your diabetes management plan. This may include: ? Checking your blood glucose regularly. ? Taking oral medicines, insulin, or both. ? Exercising for at least 30 minutes on 5 or more days each week, or as told by your health care provider. ? Tracking how many servings of carbohydrates you eat at each meal.  You may be  given specific guidelines on how much of certain foods and nutrients you may eat, depending on your stage of kidney disease and whether you have high blood pressure (hypertension). Follow your meal plan as told by your dietitian. What nutrients should be limited? The items listed are not a complete list. Talk with your dietitian about what dietary choices are best for you. Potassium Potassium affects how steadily your heart beats. If too much potassium builds up in your blood, it can  cause an irregular heartbeat or even a heart attack. You may need to eat less potassium, depending on your blood potassium levels and the stage of kidney disease. Talk to your dietitian about how much potassium you may have each day. You may need to limit or avoid foods that are high in potassium, such as:  Milk and soy milk.  Fruits, such as bananas, papaya, apricots, nectarines, melon, prunes, raisins, kiwi, and oranges.  Vegetables, such as potatoes, sweet potatoes, yams, tomatoes, leafy greens, beets, okra, avocado, pumpkin, and winter squash.  White and lima beans. Phosphorus Phosphorus is a mineral found in your bones. A balance between calcium and phosphorous is needed to build and maintain healthy bones. Too much phosphorus pulls calcium from your bones. This can make your bones weak and more likely to break. Too much phosphorus can also make your skin itch. You may need to eat less phosphorus depending on your blood phosphorus levels and the stage of kidney disease. Talk to your dietitian about how much potassium you may have each day. You may need to take medicine to lower your blood phosphorus levels if diet changes do not help. You may need to limit or avoid foods that are high in phosphorus, such as:  Milk and dairy products.  Dried beans and peas.  Tofu, soy milk, and other soy-based meat replacements.  Colas.  Nuts and peanut butter.  Meat, poultry, and fish.  Bran cereals and oatmeals. Protein Protein helps you to make and keep muscle. It also helps in the repair of your body's cells and tissues. One of the natural breakdown products of protein is a waste product called urea. When your kidneys are not working properly, they cannot remove wastes, such as urea, like they did before you developed chronic kidney disease. Reducing how much protein you eat can help prevent a buildup of urea in your blood. Depending on your stage of kidney disease, you may need to limit foods  that are high in protein. Sources of animal protein include:  Meat (all types).  Fish and seafood.  Poultry.  Eggs.  Dairy. Other protein foods include:  Beans and legumes.  Nuts and nut butter.  Soy and tofu. Sodium Sodium, which is found in salt, helps maintain a healthy balance of fluids in your body. Too much sodium can increase your blood pressure and have a negative effect on the function of your heart and lungs. Too much sodium can also cause your body to retain too much fluid, making your kidneys work harder. Most people should have less than 2,300 milligrams (mg) of sodium each day. If you have hypertension, you may need to limit your sodium to 1,500 mg each day. Talk to your dietitian about how much sodium you may have each day. You may need to limit or avoid foods that are high in sodium, such as:  Salt seasonings.  Soy sauce.  Cured and processed meats.  Salted crackers and snack foods.  Fast food.  Canned soups and most  canned foods.  Pickled foods.  Vegetable juice.  Boxed mixes or ready-to-eat boxed meals and side dishes.  Bottled dressings, sauces, and marinades. Summary  Chronic kidney disease can lead to a buildup and imbalance of waste and excess substances in the body. Certain foods lead to a buildup of these substances. By adjusting your intake of these foods, you could help prevent more kidney damage and delay or prevent the need for dialysis.  Food adjustments are different for each person with chronic kidney disease. Work with a dietitian to set up nutrient goals and a meal plan that is right for you.  If you have diabetes and chronic kidney disease, it is important to keep your blood glucose in the target range recommended by your health care provider. This information is not intended to replace advice given to you by your health care provider. Make sure you discuss any questions you have with your health care provider. Document Released:  10/04/2002 Document Revised: 11/04/2018 Document Reviewed: 07/09/2016 Elsevier Patient Education  2020 [employer].

## 2019-03-11 NOTE — Progress Notes (Signed)
Patient ID: Paula Haynes, female  DOB: [birth year], 83 y.o.   MRN: [id number] Patient Care Team    Relationship Specialty Notifications Start End  Ma Hillock, DO PCP - General Family Medicine  12/28/17   Jacelyn Pi, MD Consulting Physician Endocrinology  11/16/17     Chief Complaint  Patient presents with  . Hypertension    took medication early this afternoon. we have a copy of her bp readings. she just has some concerns.     Subjective:  Paula Haynes is a 83 y.o.  female present for follow up  Hypertension/CKD3/mild anemia: Pt reports compliance  with amlodipine 5 mg daily and lisinopril 30 mg QD. Blood pressures ranges have been checked and mostly in the high 283M, low 629U systolic.  Patient denies chest pain, shortness of breath, dizziness or lower extremity edema.  She was found to have lower than her normal baseline kidney function after last labs.  Her repeat labs did show mild elevation in her potassium and her GFR increased to 37 (her baseline is 37-44).  He was also found to have a very mild elevation in protein (8.3) in her blood which was new for her.  SPEP has been collected and pending. Pt takes a  daily baby ASA. Pt is  Not prescribed statin.  She takes omega-3 1000 mg a day. Diet: No routine diet.  Patient states she eats anything she wants. Exercise: No routine exercise, but she is very active in her house and in her yard.  Depression screen [facility] 2/9 02/22/2019 12/28/2017 11/16/2017 02/15/2016 05/19/2014  Decreased Interest 0 0 1 0 0  Down, Depressed, Hopeless 0 0 0 0 0  PHQ - 2 Score 0 0 1 0 0   No flowsheet data found.    Fall Risk  12/28/2017 11/16/2017 02/15/2016 05/19/2014 02/09/2013  Falls in the past year? No No No No No    Immunization History  Administered Date(s) Administered  . Influenza Split 05/02/2011  . Influenza, High Dose Seasonal PF 06/17/2016, 07/23/2018  . Influenza,inj,Quad PF,6+ Mos 05/19/2014  . Pneumococcal Conjugate-13 06/17/2016   . Pneumococcal Polysaccharide-23 01/22/2012  . Zoster 01/22/2012    No exam data present  Past Medical History:  Diagnosis Date  . Arthritis   . History of blood product transfusion   . Hypertension   . Thyroid disease    No Known Allergies Past Surgical History:  Procedure Laterality Date  . FINGER SURGERY     right middle finger/due to glass window cutting finger  . REFRACTIVE SURGERY     bil   Family History  Problem Relation Age of Onset  . Heart disease Father   . Arthritis Other        parent  . Hypertension Other        parent  . Stroke Other        parent   Social History   Socioeconomic History  . Marital status: Married    Spouse name: Not on file  . Number of children: Not on file  . Years of education: Not on file  . Highest education level: Not on file  Occupational History  . Not on file  Social Needs  . Financial resource strain: Not on file  . Food insecurity    Worry: Not on file    Inability: Not on file  . Transportation needs    Medical: Not on file    Non-medical: Not on file  Tobacco Use  .  Smoking status: Never Smoker  . Smokeless tobacco: Never Used  Substance and Sexual Activity  . Alcohol use: No    Alcohol/week: 0.0 standard drinks  . Drug use: No  . Sexual activity: Not Currently  Lifestyle  . Physical activity    Days per week: Not on file    Minutes per session: Not on file  . Stress: Not on file  Relationships  . Social Herbalist on phone: Not on file    Gets together: Not on file    Attends religious service: Not on file    Active member of club or organization: Not on file    Attends meetings of clubs or organizations: Not on file    Relationship status: Not on file  . Intimate partner violence    Fear of current or ex partner: Not on file    Emotionally abused: Not on file    Physically abused: Not on file    Forced sexual activity: Not on file  Other Topics Concern  . Not on file  Social  History Narrative   Widowed.  College-educated for years plus.  Self-employed/retired.   Takes a daily vitamin.   Drinks caffeine.   Smoke alarm in the home.   Wears a seatbelt.   Allergies as of 03/11/2019   No Known Allergies     Medication List       Accurate as of March 11, 2019  2:10 PM. If you have any questions, ask your nurse or doctor.        amLODipine 5 MG tablet Commonly known as: NORVASC Take 1 tablet (5 mg total) by mouth daily.   aspirin 81 MG tablet Take 81 mg by mouth daily.   Blood Pressure Kit Devi 1 Device by Does not apply route daily. Large cuff   lisinopril 30 MG tablet Commonly known as: ZESTRIL Take 1 tablet (30 mg total) by mouth daily.   Omega-3 1000 MG Caps Take by mouth.   Synthroid 100 MCG tablet Generic drug: levothyroxine Take 1 tablet by mouth daily.   Vitamin D3 125 MCG (5000 UT) Caps Take 1 capsule by mouth.       All past medical history, surgical history, allergies, family history, immunizations andmedications were updated in the EMR today and reviewed under the history and medication portions of their EMR.     No results found.   ROS: 14 pt review of systems performed and negative (unless mentioned in an HPI)  Objective: BP 138/89 (BP Location: Left Arm, Patient Position: Sitting, Cuff Size: Normal)   Pulse 90   Temp 98.2 F (36.8 C) (Temporal)   Resp 17   Ht 5' (1.524 m)   Wt 134 lb 8 oz (61 kg)   SpO2 99%   BMI 26.27 kg/m  Gen: Afebrile. No acute distress.  Nontoxic in presentation, pleasant, Caucasian female. HENT: AT. [place].  MMM.  Eyes:Pupils Equal Round Reactive to light, Extraocular movements intact,  Conjunctiva without redness, discharge or icterus. Neck/lymp/endocrine: Supple, no lymphadenopathy, no thyromegaly CV: RRR no murmur, no edema, +2/4 P posterior tibialis pulses Chest: CTAB, no wheeze or crackles Abd: Soft. NTND. BS present.  Skin: No rashes, purpura or petechiae.  Neuro:  Normal gait. PERLA.  EOMi. Alert. Oriented x3 Psych: Normal affect, dress and demeanor. Normal speech. Normal thought content and judgment.   Assessment/plan: Paula Haynes is a 83 y.o. female present for  Essential hypertension/CKD 3//elevated protein/hyperkalemia - Continue lisinopril 30 mg daily.  Increased amlodipine to 7.5 mg daily. - continue current regimen amlodipine 5 mg QD and lisinopril 20 mg QD.  -  Hydrate.  Discussed chronic kidney disease diet and she was provided with information for reading material today. -Discussed optimal control of her blood pressure, consuming a healthy diet and exercise will help preserve the remainder of her kidney function. -SPEP is still pending.  She was advised would call her next week once that lab has returned. -Hyperkalemia: Advised her to avoid high potassium meals.  She consumes a few bananas a day.  Asked her to refrain from any bananas for at least a month and then she can go back to eating 1 to 2-week if she desires. -Avoid NSAIDs. -Renally dose meds. -Repeat renal panel every 4-6 months. - PTH/calcium/phosphorus/vitamin D every 6-12 months   - UTD 01/08/2018: PTH 58/ca 9.6/vit d 48 - F/u q 6 mos.  > 25 minutes spent with patient, >50% of time spent face to face   No orders of the defined types were placed in this encounter.    Note is dictated utilizing voice recognition software. Although note has been proof read prior to signing, occasional typographical errors still can be missed. If any questions arise, please do not hesitate to call for verification.  Electronically signed by: Howard Pouch, DO [place]

## 2019-03-14 LAB — PROTEIN,TOTAL AND ELECTROPHOR W/IFE
Albumin ELP: 4.1 g/dL (ref 3.8–4.8)
Alpha 1: 0.3 g/dL (ref 0.2–0.3)
Alpha 2: 0.9 g/dL (ref 0.5–0.9)
Beta 2: 0.5 g/dL (ref 0.2–0.5)
Beta Globulin: 0.5 g/dL (ref 0.4–0.6)
Gamma Globulin: 1.9 g/dL — ABNORMAL HIGH (ref 0.8–1.7)
Immunofix Electr Int: NOT DETECTED
Total Protein: 8.2 g/dL — ABNORMAL HIGH (ref 6.1–8.1)

## 2019-03-14 LAB — EXTRA SPECIMEN

## 2019-03-17 ENCOUNTER — Telehealth: Payer: Self-pay

## 2019-03-17 DIAGNOSIS — N183 Chronic kidney disease, stage 3 unspecified: Secondary | ICD-10-CM

## 2019-03-17 DIAGNOSIS — E8809 Other disorders of plasma-protein metabolism, not elsewhere classified: Secondary | ICD-10-CM

## 2019-03-17 DIAGNOSIS — R779 Abnormality of plasma protein, unspecified: Secondary | ICD-10-CM

## 2019-03-17 NOTE — Telephone Encounter (Signed)
Pt called and left VM on nurses VM asking for her lab work results. Dr Raoul Pitch is on vacation. Pt was called and told she would be given a call next week with the add on lab results as these did take time to come back.  Pt stated "I might as well tape my mouth shut because there are no foods I can eat anymore". She also said she did not have time to eat like this. After talking to patient about Kidney disease and the diet, Pt would like a referral to a dietician.

## 2019-03-21 NOTE — Addendum Note (Signed)
Addended by: Howard Pouch A on: 03/21/2019 12:41 PM   Modules accepted: Orders

## 2019-03-21 NOTE — Telephone Encounter (Signed)
Pt was called and given information. She was scheduled for labs and F/U appt. She verbalized understanding

## 2019-03-21 NOTE — Telephone Encounter (Signed)
Please inform patient the following information: Her speciality lab (SPEP) results do not show the band of protein we were most concerned with called the  M- spike, but it did confirm a MILD  increase in the gamma goblins. In order to confirm [place] we need to do one additional lab test>> which is ordered for her to be scheduled by lab only. We can discuss results via phone visit 1 week after lab collection.   As far as her diet- the diet provided to her was the basics on a chronic renal/kidney disease diet. I have referred her to a kidney specialist called a nephrologist to start following for her chronic kidney disease- they will also be able to provide her with more instructions on dietary modifications. They will be calling her to schedule. I can also place a dietician referral if she still desires- however I am uncertain if insurance will cover this for her and nephrology typically has great resources. Up to her. Please advise if she still wants dietician as well and I will place.

## 2019-03-25 ENCOUNTER — Other Ambulatory Visit: Payer: Self-pay

## 2019-03-25 ENCOUNTER — Ambulatory Visit: Payer: PPO | Admitting: Family Medicine

## 2019-03-25 ENCOUNTER — Ambulatory Visit (INDEPENDENT_AMBULATORY_CARE_PROVIDER_SITE_OTHER): Payer: PPO | Admitting: Family Medicine

## 2019-03-25 DIAGNOSIS — E8809 Other disorders of plasma-protein metabolism, not elsewhere classified: Secondary | ICD-10-CM

## 2019-03-25 DIAGNOSIS — R779 Abnormality of plasma protein, unspecified: Secondary | ICD-10-CM

## 2019-03-26 LAB — IGG, IGA, IGM
IgG (Immunoglobin G), Serum: 2207 mg/dL — ABNORMAL HIGH (ref 600–1540)
IgM, Serum: 54 mg/dL (ref 50–300)
Immunoglobulin A: 336 mg/dL — ABNORMAL HIGH (ref 70–320)

## 2019-03-28 ENCOUNTER — Telehealth: Payer: Self-pay | Admitting: Family Medicine

## 2019-03-28 DIAGNOSIS — R768 Other specified abnormal immunological findings in serum: Secondary | ICD-10-CM

## 2019-03-28 NOTE — Telephone Encounter (Signed)
Please inform patient the following information: Her test resulted with elevated levels of both IGA and IGG. These can be the cause of her worsening kidney function.  I have referred her to a nephrologist at her last appointment>>> can we see if she does have an appointment set up and with who.  Please make sure all of her recent labs are forwarded to that office further review prior to her establishment.  If she does not have an appointment as of yet we need to expedite her referral for her.

## 2019-03-29 DIAGNOSIS — R768 Other specified abnormal immunological findings in serum: Secondary | ICD-10-CM | POA: Insufficient documentation

## 2019-03-29 NOTE — Telephone Encounter (Signed)
Pt was called and given information and verbalized understanding. She has not had anyone call with appt time and date. Sent to Diane to look into referral and expedite it

## 2019-03-29 NOTE — Telephone Encounter (Signed)
Spoke with Physicist, medical at [facility]. Patient was rated a 4. They will be contacting her to schedule in 2 months.

## 2019-03-29 NOTE — Telephone Encounter (Signed)
FYI to Dr Raoul Pitch

## 2019-03-29 NOTE — Telephone Encounter (Signed)
Faxed newest labs to [facility] to review

## 2019-03-29 NOTE — Telephone Encounter (Addendum)
I guess that will have to be ok. I am assuming they were also supplied with the last results of elevated Igg and Iga when making that decision. If not please make sure they have it.

## 2019-04-06 ENCOUNTER — Other Ambulatory Visit: Payer: Self-pay

## 2019-04-06 ENCOUNTER — Encounter: Payer: Self-pay | Admitting: Family Medicine

## 2019-04-06 ENCOUNTER — Ambulatory Visit (INDEPENDENT_AMBULATORY_CARE_PROVIDER_SITE_OTHER): Payer: PPO | Admitting: Family Medicine

## 2019-04-06 VITALS — Ht 60.0 in

## 2019-04-06 DIAGNOSIS — R768 Other specified abnormal immunological findings in serum: Secondary | ICD-10-CM

## 2019-04-06 DIAGNOSIS — N183 Chronic kidney disease, stage 3 unspecified: Secondary | ICD-10-CM

## 2019-04-06 NOTE — Progress Notes (Signed)
VIRTUAL VISIT VIA -telephone  I connected with Paula Haynes on 04/06/19 at  3:30 PM EDT by a telephoneapplication and verified that I am speaking with the correct person using two identifiers. Location patient: Home Location provider: [facility], Office Persons participating in the virtual visit: Patient, Dr. Raoul Pitch and R.Baker, LPN  I discussed the limitations of evaluation and management by telemedicine and the availability of in person appointments. The patient expressed understanding and agreed to proceed.     Patient ID: Paula Haynes, female  DOB: 01-Sep-1935, 83 y.o.   MRN: [id number] Patient Care Team    Relationship Specialty Notifications Start End  Ma Hillock, DO PCP - General Family Medicine  12/28/17   Jacelyn Pi, MD Consulting Physician Endocrinology  11/16/17   Kidney, [facility]    03/29/19     Chief Complaint  Patient presents with  . Discuss Labs    Pt would like labs explained as far as what it means. Kidney MD appt 05/09/2019.     Subjective:  Paula Haynes is a 83 y.o.  female present for follow up  CKD3/mild anemia/IGG elevation: Telephone conversation was held today to discuss her work-up surrounding her decreasing kidney function.  She has had chronic kidney disease stage III since at least 2013 by electronic medical record review.  Patient was not aware of this, per her report, until she started seeing this provider.  More recently her kidney function started to decline more with a creatinine of 1.36/GFR 37 and she had a new elevated protein level of 8.3.  Further studies were completed and patient was found to have increasing gammaglobulins of 1.9 by SPEP.  Immunoglobulins were then tested and patient was found to have mildly elevated IgA of 386 and rather significant elevation of IgG at 2207.  IgM normal.   Depression screen [facility] 2/9 02/22/2019 12/28/2017 11/16/2017 02/15/2016 05/19/2014  Decreased Interest 0 0 1 0 0  Down, Depressed,  Hopeless 0 0 0 0 0  PHQ - 2 Score 0 0 1 0 0   No flowsheet data found.    Fall Risk  12/28/2017 11/16/2017 02/15/2016 05/19/2014 02/09/2013  Falls in the past year? No No No No No    Immunization History  Administered Date(s) Administered  . Influenza Split 05/02/2011  . Influenza, High Dose Seasonal PF 06/17/2016, 07/23/2018  . Influenza,inj,Quad PF,6+ Mos 05/19/2014  . Pneumococcal Conjugate-13 06/17/2016  . Pneumococcal Polysaccharide-23 01/22/2012  . Zoster 01/22/2012    No exam data present  Past Medical History:  Diagnosis Date  . Arthritis   . History of blood product transfusion   . Hypertension   . Thyroid disease    No Known Allergies Past Surgical History:  Procedure Laterality Date  . FINGER SURGERY     right middle finger/due to glass window cutting finger  . REFRACTIVE SURGERY     bil   Family History  Problem Relation Age of Onset  . Heart disease Father   . Arthritis Other        parent  . Hypertension Other        parent  . Stroke Other        parent   Social History   Socioeconomic History  . Marital status: Married    Spouse name: Not on file  . Number of children: Not on file  . Years of education: Not on file  . Highest education level: Not on file  Occupational History  .  Not on file  Social Needs  . Financial resource strain: Not on file  . Food insecurity    Worry: Not on file    Inability: Not on file  . Transportation needs    Medical: Not on file    Non-medical: Not on file  Tobacco Use  . Smoking status: Never Smoker  . Smokeless tobacco: Never Used  Substance and Sexual Activity  . Alcohol use: No    Alcohol/week: 0.0 standard drinks  . Drug use: No  . Sexual activity: Not Currently  Lifestyle  . Physical activity    Days per week: Not on file    Minutes per session: Not on file  . Stress: Not on file  Relationships  . Social Herbalist on phone: Not on file    Gets together: Not on file    Attends  religious service: Not on file    Active member of club or organization: Not on file    Attends meetings of clubs or organizations: Not on file    Relationship status: Not on file  . Intimate partner violence    Fear of current or ex partner: Not on file    Emotionally abused: Not on file    Physically abused: Not on file    Forced sexual activity: Not on file  Other Topics Concern  . Not on file  Social History Narrative   Widowed.  College-educated for years plus.  Self-employed/retired.   Takes a daily vitamin.   Drinks caffeine.   Smoke alarm in the home.   Wears a seatbelt.   Allergies as of 04/06/2019   No Known Allergies     Medication List       Accurate as of April 06, 2019  3:28 PM. If you have any questions, ask your nurse or doctor.        amLODipine 5 MG tablet Commonly known as: NORVASC Take 1 tablet (5 mg total) by mouth daily.   amLODipine 2.5 MG tablet Commonly known as: NORVASC Take 1 tablet (2.5 mg total) by mouth daily.   aspirin 81 MG tablet Take 81 mg by mouth daily.   Blood Pressure Kit Devi 1 Device by Does not apply route daily. Large cuff   lisinopril 30 MG tablet Commonly known as: ZESTRIL Take 1 tablet (30 mg total) by mouth daily.   Omega-3 1000 MG Caps Take by mouth.   Synthroid 100 MCG tablet Generic drug: levothyroxine Take 1 tablet by mouth daily.   Vitamin D3 125 MCG (5000 UT) Caps Take 1 capsule by mouth.       All past medical history, surgical history, allergies, family history, immunizations andmedications were updated in the EMR today and reviewed under the history and medication portions of their EMR.     No results found.   ROS: 14 pt review of systems performed and negative (unless mentioned in an HPI)  Objective: Ht 5' (1.524 m)   BMI 26.27 kg/m  Gen: Afebrile. No acute distress.  Chest: No cough.  No shortness of breath. Neuro:  Alert. Oriented.  Psych: Normal affect, dress and demeanor. Normal  speech. Normal thought content and judgment.    Assessment/plan: Paula Haynes is a 83 y.o. female present for  CKD 3//elevated protein/elevated IgG -Discussed results with her today via telephone.  Patient was briefly educated on elevations in immunoglobulins and other potential role in kidney disease. -She is trying to follow a chronic kidney disease diet.  She has had hyperkalemia in the past but was eating 1-2 bananas daily at that time. -She has been referred to nephrology and has an upcoming appointment in about 4 weeks. -Continue routine follow-ups for chronic medical conditions with this provider.   -Chronic kidney disease and further evaluation of immunoglobulins will be completed by nephrology.  No orders of the defined types were placed in this encounter.  > 15 minutes spent with patient   Note is dictated utilizing voice recognition software. Although note has been proof read prior to signing, occasional typographical errors still can be missed. If any questions arise, please do not hesitate to call for verification.  Electronically signed by: Howard Pouch, DO [place]

## 2019-04-11 ENCOUNTER — Other Ambulatory Visit: Payer: Self-pay

## 2019-04-11 ENCOUNTER — Encounter: Payer: Self-pay | Admitting: Family Medicine

## 2019-04-11 ENCOUNTER — Ambulatory Visit (INDEPENDENT_AMBULATORY_CARE_PROVIDER_SITE_OTHER): Payer: PPO

## 2019-04-11 DIAGNOSIS — Z23 Encounter for immunization: Secondary | ICD-10-CM

## 2019-05-09 DIAGNOSIS — E875 Hyperkalemia: Secondary | ICD-10-CM | POA: Diagnosis not present

## 2019-05-09 DIAGNOSIS — I10 Essential (primary) hypertension: Secondary | ICD-10-CM | POA: Diagnosis not present

## 2019-05-09 DIAGNOSIS — N1832 Chronic kidney disease, stage 3b: Secondary | ICD-10-CM | POA: Diagnosis not present

## 2019-05-09 LAB — BASIC METABOLIC PANEL
BUN: 31 — AB (ref 4–21)
Creatinine: 1.3 — AB (ref 0.5–1.1)
Potassium: 5 (ref 3.4–5.3)
Sodium: 136 — AB (ref 137–147)

## 2019-05-09 LAB — HEPATIC FUNCTION PANEL
ALT: 9 (ref 7–35)
AST: 20 (ref 13–35)
Bilirubin, Total: 0.3

## 2019-05-20 ENCOUNTER — Other Ambulatory Visit: Payer: Self-pay | Admitting: Nephrology

## 2019-05-20 DIAGNOSIS — I10 Essential (primary) hypertension: Secondary | ICD-10-CM

## 2019-05-20 DIAGNOSIS — N1832 Chronic kidney disease, stage 3b: Secondary | ICD-10-CM

## 2019-06-02 ENCOUNTER — Ambulatory Visit
Admission: RE | Admit: 2019-06-02 | Discharge: 2019-06-02 | Disposition: A | Discharge status: Home / Self Care | Payer: PPO | Source: Ambulatory Visit | Attending: Nephrology | Admitting: Nephrology

## 2019-06-02 ENCOUNTER — Other Ambulatory Visit: Payer: Self-pay

## 2019-06-02 DIAGNOSIS — I10 Essential (primary) hypertension: Secondary | ICD-10-CM | POA: Diagnosis not present

## 2019-06-02 DIAGNOSIS — N1832 Chronic kidney disease, stage 3b: Secondary | ICD-10-CM | POA: Insufficient documentation

## 2019-06-02 DIAGNOSIS — N183 Chronic kidney disease, stage 3 unspecified: Secondary | ICD-10-CM | POA: Diagnosis not present

## 2019-06-06 DIAGNOSIS — M810 Age-related osteoporosis without current pathological fracture: Secondary | ICD-10-CM | POA: Diagnosis not present

## 2019-06-07 DIAGNOSIS — I10 Essential (primary) hypertension: Secondary | ICD-10-CM | POA: Diagnosis not present

## 2019-06-07 DIAGNOSIS — M810 Age-related osteoporosis without current pathological fracture: Secondary | ICD-10-CM | POA: Diagnosis not present

## 2019-06-07 DIAGNOSIS — E890 Postprocedural hypothyroidism: Secondary | ICD-10-CM | POA: Diagnosis not present

## 2019-06-07 DIAGNOSIS — E559 Vitamin D deficiency, unspecified: Secondary | ICD-10-CM | POA: Diagnosis not present

## 2019-06-09 ENCOUNTER — Ambulatory Visit: Payer: PPO

## 2019-06-21 DIAGNOSIS — N1832 Chronic kidney disease, stage 3b: Secondary | ICD-10-CM | POA: Diagnosis not present

## 2019-06-21 DIAGNOSIS — R8281 Pyuria: Secondary | ICD-10-CM | POA: Diagnosis not present

## 2019-06-21 DIAGNOSIS — I10 Essential (primary) hypertension: Secondary | ICD-10-CM | POA: Diagnosis not present

## 2019-06-22 ENCOUNTER — Encounter: Payer: Self-pay | Admitting: Family Medicine

## 2019-08-25 ENCOUNTER — Telehealth: Payer: Self-pay

## 2019-08-25 MED ORDER — LISINOPRIL 30 MG PO TABS: 30.0000 mg | ORAL_TABLET | Freq: Every day | ORAL | 0 refills | Status: DC

## 2019-08-25 NOTE — Telephone Encounter (Signed)
2 week supply was sent to the pharmacy, pt notified on her VM

## 2019-08-25 NOTE — Telephone Encounter (Signed)
Patient is out of lisinopril (ZESTRIL) 30 MG tablet. Can patient get enough pills to last until 08/29/19 appointment? Please send to  [facility].

## 2019-08-26 DIAGNOSIS — H43813 Vitreous degeneration, bilateral: Secondary | ICD-10-CM | POA: Diagnosis not present

## 2019-08-29 ENCOUNTER — Other Ambulatory Visit: Payer: Self-pay

## 2019-08-29 ENCOUNTER — Encounter: Payer: Self-pay | Admitting: Family Medicine

## 2019-08-29 ENCOUNTER — Ambulatory Visit (INDEPENDENT_AMBULATORY_CARE_PROVIDER_SITE_OTHER): Payer: PPO | Admitting: Family Medicine

## 2019-08-29 VITALS — BP 162/90 | HR 80 | Temp 98.2°F | Resp 16 | Ht 62.0 in | Wt 135.5 lb

## 2019-08-29 DIAGNOSIS — I10 Essential (primary) hypertension: Secondary | ICD-10-CM | POA: Diagnosis not present

## 2019-08-29 DIAGNOSIS — E663 Overweight: Secondary | ICD-10-CM

## 2019-08-29 DIAGNOSIS — R768 Other specified abnormal immunological findings in serum: Secondary | ICD-10-CM

## 2019-08-29 DIAGNOSIS — R779 Abnormality of plasma protein, unspecified: Secondary | ICD-10-CM

## 2019-08-29 DIAGNOSIS — N183 Chronic kidney disease, stage 3 unspecified: Secondary | ICD-10-CM | POA: Diagnosis not present

## 2019-08-29 DIAGNOSIS — E8809 Other disorders of plasma-protein metabolism, not elsewhere classified: Secondary | ICD-10-CM | POA: Diagnosis not present

## 2019-08-29 DIAGNOSIS — D696 Thrombocytopenia, unspecified: Secondary | ICD-10-CM

## 2019-08-29 MED ORDER — AMLODIPINE BESYLATE 10 MG PO TABS: 10.0000 mg | ORAL_TABLET | Freq: Every day | ORAL | 1 refills | Status: DC

## 2019-08-29 MED ORDER — LISINOPRIL 30 MG PO TABS: 30.0000 mg | ORAL_TABLET | Freq: Every day | ORAL | 1 refills | Status: DC

## 2019-08-29 NOTE — Progress Notes (Signed)
Patient ID: Paula Haynes, female  DOB: 15-Aug-1935, 84 y.o.   MRN: [id number] Patient Care Team    Relationship Specialty Notifications Start End  Ma Hillock, DO PCP - General Family Medicine  12/28/17   Jacelyn Pi, MD Consulting Physician Endocrinology  11/16/17   Murlean Iba, MD  Nephrology  08/30/19   Brendolyn Patty, MD  Dermatology  08/30/19   Gardiner Barefoot, [place] Consulting Physician Podiatry  08/30/19   Susa Day, MD Consulting Physician Orthopedic Surgery  08/30/19   Leandrew Koyanagi, MD Referring Physician Ophthalmology  08/30/19     Chief Complaint  Patient presents with  . Hypertension    Pt needs refills on medications. Takes BP meds in the AM.   . Chronic Kidney Disease    Subjective: Paula Haynes is a 84 y.o.  female present for follow up Hypertension/CKD3/mild anemia: Pt reports  compliance  with amlodipine 7.5 mg daily and lisinopril 30 mg QD. Blood pressures ranges have been checked and mostly in the  163W systolic.  Her blood pressure was elevated in the 466Z systolics at her nephrology appointment. Patient denies chest pain, shortness of breath, dizziness or lower extremity edema.  She has been referred to nephrology for her decreasing kidney function since 2013.  GFR last 37 (her baseline is 37-44).  SHe was also found to have a very mild elevation in protein (8.3) in her blood which was new for her.  SPEP was without M spike.  IgGs abnormal.  Renal ultrasound completed at nephrology 06/21/2019 with cortical volume loss reported.  Nephrology felt her worsening kidney function was secondary to hypertension and atherosclerosis. Pt takes a  daily baby ASA. Pt is  Not prescribed statin.  She takes omega-3 1000 mg a day. Diet: No routine diet.  Patient states she eats anything she wants.  She does have higher sodium content diet Exercise: No routine exercise, but she is very active in her house and in her yard. CMP Latest Ref Rng & Units 08/29/2019 08/29/2019  05/09/2019  Glucose 65 - 99 mg/dL - 85 -  BUN 7 - 25 mg/dL - 28(H) 31(A)  Creatinine 0.60 - 0.88 mg/dL - 1.18(H) 1.3(A)  Sodium 135 - 146 mmol/L - 137 136(A)  Potassium 3.5 - 5.3 mmol/L - 4.8 5.0  Chloride 98 - 110 mmol/L - 106 -  CO2 20 - 32 mmol/L - 19(L) -  Calcium 8.6 - 10.4 mg/dL 8.7 8.7 -  Total Protein 6.1 - 8.1 g/dL - - -  Total Bilirubin 0.2 - 1.2 mg/dL - - -  Alkaline Phos 39 - 117 U/L - - -  AST 13 - 35 - - 20  ALT 7 - 35 - - 9   CBC Latest Ref Rng & Units 08/29/2019 02/25/2019 08/05/2018  WBC 3.8 - 10.8 Thousand/uL 4.9 5.7 4.3  Hemoglobin 11.7 - 15.5 g/dL 11.4(L) 11.9 11.3(L)  Hematocrit 35.0 - 45.0 % 34.5(L) 36.0 34.4(L)  Platelets 140 - 400 Thousand/uL 150 242 212    Depression screen [facility] 2/9 02/22/2019 12/28/2017 11/16/2017 02/15/2016 05/19/2014  Decreased Interest 0 0 1 0 0  Down, Depressed, Hopeless 0 0 0 0 0  PHQ - 2 Score 0 0 1 0 0   No flowsheet data found.   Immunization History  Administered Date(s) Administered  . Fluad Quad(high Dose 65+) 04/11/2019  . Influenza Split 05/02/2011  . Influenza, High Dose Seasonal PF 06/17/2016, 07/23/2018  . Influenza,inj,Quad PF,6+ Mos 05/19/2014  . Pneumococcal Conjugate-13 06/17/2016  .  Pneumococcal Polysaccharide-23 01/22/2012  . Zoster 01/22/2012    No exam data present  Past Medical History:  Diagnosis Date  . Arthritis   . History of blood product transfusion   . Hypertension   . Thyroid disease    No Known Allergies Past Surgical History:  Procedure Laterality Date  . FINGER SURGERY     right middle finger/due to glass window cutting finger  . REFRACTIVE SURGERY     bil   Family History  Problem Relation Age of Onset  . Heart disease Father   . Arthritis Other        parent  . Hypertension Other        parent  . Stroke Other        parent   Social History   Socioeconomic History  . Marital status: Married    Spouse name: Not on file  . Number of children: Not on file  . Years of education:  Not on file  . Highest education level: Not on file  Occupational History  . Not on file  Tobacco Use  . Smoking status: Never Smoker  . Smokeless tobacco: Never Used  Substance and Sexual Activity  . Alcohol use: No    Alcohol/week: 0.0 standard drinks  . Drug use: No  . Sexual activity: Not Currently  Other Topics Concern  . Not on file  Social History Narrative   Widowed.  College-educated for years plus.  Self-employed/retired.   Takes a daily vitamin.   Drinks caffeine.   Smoke alarm in the home.   Wears a seatbelt.   Social Determinants of Health   Financial Resource Strain:   . Difficulty of Paying Living Expenses: Not on file  Food Insecurity:   . Worried About Charity fundraiser in the Last Year: Not on file  . Ran Out of Food in the Last Year: Not on file  Transportation Needs:   . Lack of Transportation (Medical): Not on file  . Lack of Transportation (Non-Medical): Not on file  Physical Activity:   . Days of Exercise per Week: Not on file  . Minutes of Exercise per Session: Not on file  Stress:   . Feeling of Stress : Not on file  Social Connections:   . Frequency of Communication with Friends and Family: Not on file  . Frequency of Social Gatherings with Friends and Family: Not on file  . Attends Religious Services: Not on file  . Active Member of Clubs or Organizations: Not on file  . Attends Archivist Meetings: Not on file  . Marital Status: Not on file  Intimate Partner Violence:   . Fear of Current or Ex-Partner: Not on file  . Emotionally Abused: Not on file  . Physically Abused: Not on file  . Sexually Abused: Not on file   Allergies as of 08/29/2019   No Known Allergies     Medication List       Accurate as of August 29, 2019 11:59 PM. If you have any questions, ask your nurse or doctor.        amLODipine 10 MG tablet Commonly known as: NORVASC Take 1 tablet (10 mg total) by mouth daily. What changed:   medication  strength  how much to take  Another medication with the same name was removed. Continue taking this medication, and follow the directions you see here. Changed by: Howard Pouch, DO   aspirin 81 MG tablet Take 81 mg by mouth daily.  Blood Pressure Kit Devi 1 Device by Does not apply route daily. Large cuff   lisinopril 30 MG tablet Commonly known as: ZESTRIL Take 1 tablet (30 mg total) by mouth daily.   Omega-3 1000 MG Caps Take by mouth.   Synthroid 88 MCG tablet Generic drug: levothyroxine Take 88 mcg by mouth every morning. What changed: Another medication with the same name was removed. Continue taking this medication, and follow the directions you see here. Changed by: Howard Pouch, DO   Vitamin D3 125 MCG (5000 UT) Caps Take 1 capsule by mouth.       All past medical history, surgical history, allergies, family history, immunizations andmedications were updated in the EMR today and reviewed under the history and medication portions of their EMR.     No results found.   ROS: 14 pt review of systems performed and negative (unless mentioned in an HPI)  Objective: BP (!) 162/90 (BP Location: Left Arm, Patient Position: Sitting, Cuff Size: Normal)   Pulse 80   Temp 98.2 F (36.8 C) (Temporal)   Resp 16   Ht '5\' 2"'  (1.575 m)   Wt 135 lb 8 oz (61.5 kg)   SpO2 100%   BMI 24.78 kg/m  Gen: Afebrile. No acute distress.  HENT: AT. [place].  Eyes:Pupils Equal Round Reactive to light, Extraocular movements intact,  Conjunctiva without redness, discharge or icterus. Neck/lymp/endocrine: Supple, no lymphadenopathy, no thyromegaly CV: RRR no murmur, no edema Chest: CTAB, no wheeze or crackles Neuro:  Normal gait. PERLA. EOMi. Alert. Oriented x3  Psych: Normal affect, dress and demeanor. Normal speech. Normal thought content and judgment   Assessment/plan: KEWANA SANON is a 84 y.o. female present for  Essential hypertension - Still above goal.  Lengthy conversation  today surrounding low-sodium diet and hydration. - Continue lisinopril 30 mg daily>> we will attempt to not increase this secondary to her history of hyperkalemia. -  Increased amlodipine to 10 mg daily -Discussed optimal control of her blood pressure, consuming a healthy diet and exercise will help preserve the remainder of her kidney function. -Avoid NSAIDs. -Renally dose meds. -Repeat renal panel every 4-6 months. - PTH/calcium/phosphorus/vitamin D every 6-12 months   - UTD 01/08/2018: PTH 58/ca 9.6/vit d 48 - F/u q 5 mos-sooner if blood pressures greater than 130/80 on increased dose at home.  CKD 3/hyperkalemia/elevated immunoglobulins -Avoid NSAIDs -Renally dose medications when appropriate. -Renal panel every 4-6 months> collected today -PTH, calcium, vitamin D every 6 months > collected today -Caution with high potassium and sodium meals.  She typically eats a few bananas a day, she was cautioned on hyperkalemia.  She was educated to avoid high sodium meals, but also salt substitutes which are potassium based. -DASH diet encouraged. -If nephrology feels necessary, continue routine follow-up.  As long as no further evaluation on elevated immunoglobulins are necessary and kidney function stays stable, okay to follow-up routinely here.  Orders Placed This Encounter  Procedures  . Renal Function Panel  . CBC  . PTH, Intact and Calcium  . Vitamin D (25 hydroxy)   Meds ordered this encounter  Medications  . DISCONTD: amLODipine (NORVASC) 10 MG tablet    Sig: Take 1 tablet (10 mg total) by mouth daily.    Dispense:  90 tablet    Refill:  1  . amLODipine (NORVASC) 10 MG tablet    Sig: Take 1 tablet (10 mg total) by mouth daily.    Dispense:  90 tablet    Refill:  1    DC ALL other amlodipine.  Marland Kitchen lisinopril (ZESTRIL) 30 MG tablet    Sig: Take 1 tablet (30 mg total) by mouth daily.    Dispense:  90 tablet    Refill:  1    DC all prior doses for this medication   Referral  Orders  No referral(s) requested today       Note is dictated utilizing voice recognition software. Although note has been proof read prior to signing, occasional typographical errors still can be missed. If any questions arise, please do not hesitate to call for verification.  Electronically signed by: Howard Pouch, DO [place]

## 2019-08-29 NOTE — Patient Instructions (Signed)
Increased your amlodipine to 10 mg total.  Lisinopril  Dose is the same.  Low sodium diet.  Follow up in 5-6 months.   DASH Eating Plan DASH stands for "Dietary Approaches to Stop Hypertension." The DASH eating plan is a healthy eating plan that has been shown to reduce high blood pressure (hypertension). It may also reduce your risk for type 2 diabetes, heart disease, and stroke. The DASH eating plan may also help with weight loss. What are tips for following this plan?  General guidelines  Avoid eating more than 2,300 mg (milligrams) of salt (sodium) a day. If you have hypertension, you may need to reduce your sodium intake to 1,500 mg a day.  Limit alcohol intake to no more than 1 drink a day for nonpregnant women and 2 drinks a day for men. One drink equals 12 oz of beer, 5 oz of wine, or 1 oz of hard liquor.  Work with your health care provider to maintain a healthy body weight or to lose weight. Ask what an ideal weight is for you.  Get at least 30 minutes of exercise that causes your heart to beat faster (aerobic exercise) most days of the week. Activities may include walking, swimming, or biking.  Work with your health care provider or diet and nutrition specialist (dietitian) to adjust your eating plan to your individual calorie needs. Reading food labels   Check food labels for the amount of sodium per serving. Choose foods with less than 5 percent of the Daily Value of sodium. Generally, foods with less than 300 mg of sodium per serving fit into this eating plan.  To find whole grains, look for the word "whole" as the first word in the ingredient list. Shopping  Buy products labeled as "low-sodium" or "no salt added."  Buy fresh foods. Avoid canned foods and premade or frozen meals. Cooking  Avoid adding salt when cooking. Use salt-free seasonings or herbs instead of table salt or sea salt. Check with your health care provider or pharmacist before using salt  substitutes.  Do not fry foods. Cook foods using healthy methods such as baking, boiling, grilling, and broiling instead.  Cook with heart-healthy oils, such as olive, canola, soybean, or sunflower oil. Meal planning  Eat a balanced diet that includes: ? 5 or more servings of fruits and vegetables each day. At each meal, try to fill half of your plate with fruits and vegetables. ? Up to 6-8 servings of whole grains each day. ? Less than 6 oz of lean meat, poultry, or fish each day. A 3-oz serving of meat is about the same size as a deck of cards. One egg equals 1 oz. ? 2 servings of low-fat dairy each day. ? A serving of nuts, seeds, or beans 5 times each week. ? Heart-healthy fats. Healthy fats called Omega-3 fatty acids are found in foods such as flaxseeds and coldwater fish, like sardines, salmon, and mackerel.  Limit how much you eat of the following: ? Canned or prepackaged foods. ? Food that is high in trans fat, such as fried foods. ? Food that is high in saturated fat, such as fatty meat. ? Sweets, desserts, sugary drinks, and other foods with added sugar. ? Full-fat dairy products.  Do not salt foods before eating.  Try to eat at least 2 vegetarian meals each week.  Eat more home-cooked food and less restaurant, buffet, and fast food.  When eating at a restaurant, ask that your food be prepared  with less salt or no salt, if possible. What foods are recommended? The items listed may not be a complete list. Talk with your dietitian about what dietary choices are best for you. Grains Whole-grain or whole-wheat bread. Whole-grain or whole-wheat pasta. Brown rice. [place]. Bulgur. Whole-grain and low-sodium cereals. Pita bread. Low-fat, low-sodium crackers. Whole-wheat flour tortillas. Vegetables Fresh or frozen vegetables (raw, steamed, roasted, or grilled). Low-sodium or reduced-sodium tomato and vegetable juice. Low-sodium or reduced-sodium tomato sauce and tomato  paste. Low-sodium or reduced-sodium canned vegetables. Fruits All fresh, dried, or frozen fruit. Canned fruit in natural juice (without added sugar). Meat and other protein foods Skinless chicken or [place]. Ground chicken or [place]. Pork with fat trimmed off. Fish and seafood. Egg whites. Dried beans, peas, or lentils. Unsalted nuts, nut butters, and seeds. Unsalted canned beans. Lean cuts of beef with fat trimmed off. Low-sodium, lean deli meat. Dairy Low-fat (1%) or fat-free (skim) milk. Fat-free, low-fat, or reduced-fat cheeses. Nonfat, low-sodium ricotta or cottage cheese. Low-fat or nonfat yogurt. Low-fat, low-sodium cheese. Fats and oils Soft margarine without trans fats. Vegetable oil. Low-fat, reduced-fat, or light mayonnaise and salad dressings (reduced-sodium). Canola, safflower, olive, soybean, and sunflower oils. Avocado. Seasoning and other foods Herbs. Spices. Seasoning mixes without salt. Unsalted popcorn and pretzels. Fat-free sweets. What foods are not recommended? The items listed may not be a complete list. Talk with your dietitian about what dietary choices are best for you. Grains Baked goods made with fat, such as croissants, muffins, or some breads. Dry pasta or rice meal packs. Vegetables Creamed or fried vegetables. Vegetables in a cheese sauce. Regular canned vegetables (not low-sodium or reduced-sodium). Regular canned tomato sauce and paste (not low-sodium or reduced-sodium). Regular tomato and vegetable juice (not low-sodium or reduced-sodium). Angie Fava. Olives. Fruits Canned fruit in a light or heavy syrup. Fried fruit. Fruit in cream or butter sauce. Meat and other protein foods Fatty cuts of meat. Ribs. Fried meat. Berniece Salines. Sausage. Bologna and other processed lunch meats. Salami. Fatback. Hotdogs. Bratwurst. Salted nuts and seeds. Canned beans with added salt. Canned or smoked fish. Whole eggs or egg yolks. Chicken or [place] with skin. Dairy Whole or 2% milk,  cream, and half-and-half. Whole or full-fat cream cheese. Whole-fat or sweetened yogurt. Full-fat cheese. Nondairy creamers. Whipped toppings. Processed cheese and cheese spreads. Fats and oils Butter. Stick margarine. Lard. Shortening. Ghee. Bacon fat. Tropical oils, such as coconut, palm kernel, or palm oil. Seasoning and other foods Salted popcorn and pretzels. Onion salt, garlic salt, seasoned salt, table salt, and sea salt. Worcestershire sauce. Tartar sauce. Barbecue sauce. Teriyaki sauce. Soy sauce, including reduced-sodium. Steak sauce. Canned and packaged gravies. Fish sauce. Oyster sauce. Cocktail sauce. Horseradish that you find on the shelf. Ketchup. Mustard. Meat flavorings and tenderizers. Bouillon cubes. Hot sauce and Tabasco sauce. Premade or packaged marinades. Premade or packaged taco seasonings. Relishes. Regular salad dressings. Where to find more information:  National Heart, Lung, and [place]: https://wilson-eaton.com/  American Heart Association: www.heart.org Summary  The DASH eating plan is a healthy eating plan that has been shown to reduce high blood pressure (hypertension). It may also reduce your risk for type 2 diabetes, heart disease, and stroke.  With the DASH eating plan, you should limit salt (sodium) intake to 2,300 mg a day. If you have hypertension, you may need to reduce your sodium intake to 1,500 mg a day.  When on the DASH eating plan, aim to eat more fresh fruits and vegetables, whole grains, lean  proteins, low-fat dairy, and heart-healthy fats.  Work with your health care provider or diet and nutrition specialist (dietitian) to adjust your eating plan to your individual calorie needs. This information is not intended to replace advice given to you by your health care provider. Make sure you discuss any questions you have with your health care provider. Document Revised: 06/26/2017 Document Reviewed: 07/07/2016 Elsevier Patient Education  2020 [employer].

## 2019-08-30 ENCOUNTER — Encounter: Payer: Self-pay | Admitting: Family Medicine

## 2019-08-30 ENCOUNTER — Ambulatory Visit: Payer: PPO | Admitting: Family Medicine

## 2019-08-30 LAB — CBC
HCT: 34.5 % — ABNORMAL LOW (ref 35.0–45.0)
Hemoglobin: 11.4 g/dL — ABNORMAL LOW (ref 11.7–15.5)
MCH: 30.3 pg (ref 27.0–33.0)
MCHC: 33 g/dL (ref 32.0–36.0)
MCV: 91.8 fL (ref 80.0–100.0)
MPV: 11.6 fL (ref 7.5–12.5)
Platelets: 150 10*3/uL (ref 140–400)
RBC: 3.76 10*6/uL — ABNORMAL LOW (ref 3.80–5.10)
RDW: 12.6 % (ref 11.0–15.0)
WBC: 4.9 10*3/uL (ref 3.8–10.8)

## 2019-08-30 LAB — RENAL FUNCTION PANEL
Albumin: 4.1 g/dL (ref 3.6–5.1)
BUN/Creatinine Ratio: 24 (calc) — ABNORMAL HIGH (ref 6–22)
BUN: 28 mg/dL — ABNORMAL HIGH (ref 7–25)
CO2: 19 mmol/L — ABNORMAL LOW (ref 20–32)
Calcium: 8.7 mg/dL (ref 8.6–10.4)
Chloride: 106 mmol/L (ref 98–110)
Creat: 1.18 mg/dL — ABNORMAL HIGH (ref 0.60–0.88)
Glucose, Bld: 85 mg/dL (ref 65–99)
Phosphorus: 3.3 mg/dL (ref 2.1–4.3)
Potassium: 4.8 mmol/L (ref 3.5–5.3)
Sodium: 137 mmol/L (ref 135–146)

## 2019-08-30 LAB — PTH, INTACT AND CALCIUM
Calcium: 8.7 mg/dL (ref 8.6–10.4)
PTH: 95 pg/mL — ABNORMAL HIGH (ref 14–64)

## 2019-08-30 LAB — VITAMIN D 25 HYDROXY (VIT D DEFICIENCY, FRACTURES): Vit D, 25-Hydroxy: 68 ng/mL (ref 30–100)

## 2019-08-31 ENCOUNTER — Telehealth: Payer: Self-pay | Admitting: Family Medicine

## 2019-08-31 NOTE — Telephone Encounter (Signed)
Please call patient: Her potassium is normal at 4.8. Vitamin D is in normal range She still has mild anemia/blood counts. This is stable for her in comparison to all prior labs. She has a very mild elevation in her PTH/parathyroid hormone. This is commonly seen with decreased kidney function, and we will continue to monitor her PTH routinely. Her kidney function is mildly improved from her prior results.     - Continue to take medicines as prescribed.     - Getting better control of blood pressure will help relieve the pressure on the kidneys.    -Low-sodium diet. Follow-up in 5 months, sooner if blood pressures remain greater than 130/80 despite increase in amlodipine.

## 2019-08-31 NOTE — Telephone Encounter (Signed)
Pt was called and given information, she verbalized understanding. Appt was made for [facility]

## 2019-10-20 ENCOUNTER — Ambulatory Visit: Payer: PPO

## 2019-12-12 DIAGNOSIS — M810 Age-related osteoporosis without current pathological fracture: Secondary | ICD-10-CM | POA: Diagnosis not present

## 2019-12-12 LAB — HM DEXA SCAN

## 2019-12-23 DIAGNOSIS — E875 Hyperkalemia: Secondary | ICD-10-CM | POA: Diagnosis not present

## 2019-12-23 DIAGNOSIS — I10 Essential (primary) hypertension: Secondary | ICD-10-CM | POA: Diagnosis not present

## 2019-12-23 DIAGNOSIS — R8281 Pyuria: Secondary | ICD-10-CM | POA: Diagnosis not present

## 2019-12-23 LAB — BASIC METABOLIC PANEL
BUN: 41 — AB (ref 4–21)
CO2: 18 (ref 13–22)
Chloride: 108 (ref 99–108)
Creatinine: 1.5 — AB (ref 0.5–1.1)
Glucose: 120
Potassium: 5.2 (ref 3.4–5.3)
Sodium: 136 — AB (ref 137–147)

## 2019-12-23 LAB — COMPREHENSIVE METABOLIC PANEL
Albumin: 4.3 (ref 3.5–5.0)
Calcium: 9.1 (ref 8.7–10.7)
GFR calc Af Amer: 38
GFR calc non Af Amer: 33

## 2019-12-23 LAB — CBC AND DIFFERENTIAL
HCT: 34 — AB (ref 36–46)
Hemoglobin: 11.1 — AB (ref 12.0–16.0)
Platelets: 258 (ref 150–399)
WBC: 4.3

## 2019-12-28 DIAGNOSIS — N1832 Chronic kidney disease, stage 3b: Secondary | ICD-10-CM | POA: Diagnosis not present

## 2019-12-28 DIAGNOSIS — I10 Essential (primary) hypertension: Secondary | ICD-10-CM | POA: Diagnosis not present

## 2020-01-12 ENCOUNTER — Encounter: Payer: Self-pay | Admitting: Family Medicine

## 2020-01-31 ENCOUNTER — Ambulatory Visit: Payer: PPO | Admitting: Family Medicine

## 2020-02-27 ENCOUNTER — Other Ambulatory Visit: Payer: Self-pay

## 2020-02-27 ENCOUNTER — Other Ambulatory Visit: Payer: Self-pay | Admitting: Family Medicine

## 2020-02-27 MED ORDER — LISINOPRIL 30 MG PO TABS: 30.0000 mg | ORAL_TABLET | Freq: Every day | ORAL | 0 refills | Status: DC

## 2020-02-28 ENCOUNTER — Other Ambulatory Visit: Payer: Self-pay | Admitting: Family Medicine

## 2020-02-29 ENCOUNTER — Other Ambulatory Visit: Payer: Self-pay | Admitting: Family Medicine

## 2020-04-01 ENCOUNTER — Other Ambulatory Visit: Payer: Self-pay | Admitting: Family Medicine

## 2020-04-03 NOTE — Telephone Encounter (Signed)
Pt needs to schedule f/u OV. Sent 30 day supply of lisinopril

## 2020-04-04 NOTE — Telephone Encounter (Signed)
Patient called in to schedule appointment for Lisinopril. Patient scheduled appt 04/17/20.

## 2020-04-17 ENCOUNTER — Ambulatory Visit (INDEPENDENT_AMBULATORY_CARE_PROVIDER_SITE_OTHER): Payer: PPO | Admitting: Family Medicine

## 2020-04-17 ENCOUNTER — Other Ambulatory Visit: Payer: Self-pay

## 2020-04-17 ENCOUNTER — Encounter: Payer: Self-pay | Admitting: Family Medicine

## 2020-04-17 VITALS — BP 130/80 | HR 87 | Temp 98.5°F | Ht 62.0 in | Wt 129.0 lb

## 2020-04-17 DIAGNOSIS — I10 Essential (primary) hypertension: Secondary | ICD-10-CM

## 2020-04-17 DIAGNOSIS — E039 Hypothyroidism, unspecified: Secondary | ICD-10-CM

## 2020-04-17 DIAGNOSIS — N183 Chronic kidney disease, stage 3 unspecified: Secondary | ICD-10-CM | POA: Diagnosis not present

## 2020-04-17 DIAGNOSIS — Z23 Encounter for immunization: Secondary | ICD-10-CM | POA: Diagnosis not present

## 2020-04-17 MED ORDER — AMLODIPINE BESYLATE 10 MG PO TABS: ORAL_TABLET | ORAL | 1 refills | Status: DC

## 2020-04-17 MED ORDER — LISINOPRIL 30 MG PO TABS: ORAL_TABLET | ORAL | 1 refills | Status: DC

## 2020-04-17 NOTE — Progress Notes (Signed)
Patient ID: Paula Haynes, female  DOB: 11-03-1935, 84 y.o.   MRN: [id number] Patient Care Team    Relationship Specialty Notifications Start End  Ma Hillock, DO PCP - General Family Medicine  12/28/17   Jacelyn Pi, MD Consulting Physician Endocrinology  11/16/17   Murlean Iba, MD  Nephrology  08/30/19   Brendolyn Patty, MD  Dermatology  08/30/19   Gardiner Barefoot, [place] Consulting Physician Podiatry  08/30/19   Susa Day, MD Consulting Physician Orthopedic Surgery  08/30/19   Leandrew Koyanagi, MD Referring Physician Ophthalmology  08/30/19     Chief Complaint  Patient presents with  . Follow-up    [facility]; home BP is around 130/70    Subjective: Paula Haynes is a 84 y.o.  female present for follow up Hypertension/CKD3/mild anemia: Pt reports  compliance  with amlodipine 10 mg daily and lisinopril 30 mg QD. Blood pressures ranges have been checked and mostly in the  537S systolic.  Her blood pressure was elevated in the 827M systolics at her nephrology appointment. Patient denies chest pain, shortness of breath, dizziness or lower extremity edema.  Prior note: She has been referred to nephrology for her decreasing kidney function since 2013.  GFR last 37 (her baseline is 37-44).  SHe was also found to have a very mild elevation in protein (8.3) in her blood which was new for her.  SPEP was without M spike.  IgGs abnormal.  Renal ultrasound completed at nephrology 06/21/2019 with cortical volume loss reported.  Nephrology felt her worsening kidney function was secondary to hypertension and atherosclerosis. Pt takes a  daily baby ASA. Pt is  Not prescribed statin.  She takes omega-3 1000 mg a day. Diet: No routine diet.  Patient states she eats anything she wants.  She does have higher sodium content diet Exercise: No routine exercise, but she is very active in her house and in her yard. CMP Latest Ref Rng & Units 12/23/2019 08/29/2019 08/29/2019  Glucose 65 - 99 mg/dL - - 85  BUN 4 -  21 41(A) - 28(H)  Creatinine 0.5 - 1.1 1.5(A) - 1.18(H)  Sodium 137 - 147 136(A) - 137  Potassium 3.4 - 5.3 5.2 - 4.8  Chloride 99 - 108 108 - 106  CO2 13 - 22 18 - 19(L)  Calcium 8.7 - 10.7 9.1 8.7 8.7  Total Protein 6.1 - 8.1 g/dL - - -  Total Bilirubin 0.2 - 1.2 mg/dL - - -  Alkaline Phos 39 - 117 U/L - - -  AST 13 - 35 - - -  ALT 7 - 35 - - -   CBC Latest Ref Rng & Units 12/23/2019 08/29/2019 02/25/2019  WBC - 4.3 4.9 5.7  Hemoglobin 12.0 - 16.0 11.1(A) 11.4(L) 11.9  Hematocrit 36 - 46 34(A) 34.5(L) 36.0  Platelets 150 - 399 258 150 242    Depression screen [facility] 2/9 02/22/2019 12/28/2017 11/16/2017 02/15/2016 05/19/2014  Decreased Interest 0 0 1 0 0  Down, Depressed, Hopeless 0 0 0 0 0  PHQ - 2 Score 0 0 1 0 0   No flowsheet data found.   Immunization History  Administered Date(s) Administered  . Fluad Quad(high Dose 65+) 04/11/2019, 04/17/2020  . Influenza Split 05/02/2011  . Influenza, High Dose Seasonal PF 06/17/2016, 07/23/2018  . Influenza,inj,Quad PF,6+ Mos 05/19/2014  . PFIZER SARS-COV-2 Vaccination 10/31/2019, 11/28/2019  . Pneumococcal Conjugate-13 06/17/2016  . Pneumococcal Polysaccharide-23 01/22/2012  . Zoster 01/22/2012    No exam  data present  Past Medical History:  Diagnosis Date  . Arthritis   . History of blood product transfusion   . Hypertension   . Thyroid disease    No Known Allergies Past Surgical History:  Procedure Laterality Date  . FINGER SURGERY     right middle finger/due to glass window cutting finger  . REFRACTIVE SURGERY     bil   Family History  Problem Relation Age of Onset  . Heart disease Father   . Arthritis Other        parent  . Hypertension Other        parent  . Stroke Other        parent   Social History   Socioeconomic History  . Marital status: Married    Spouse name: Not on file  . Number of children: Not on file  . Years of education: Not on file  . Highest education level: Not on file  Occupational  History  . Not on file  Tobacco Use  . Smoking status: Never Smoker  . Smokeless tobacco: Never Used  Vaping Use  . Vaping Use: Never used  Substance and Sexual Activity  . Alcohol use: No    Alcohol/week: 0.0 standard drinks  . Drug use: No  . Sexual activity: Not Currently  Other Topics Concern  . Not on file  Social History Narrative   Widowed.  College-educated for years plus.  Self-employed/retired.   Takes a daily vitamin.   Drinks caffeine.   Smoke alarm in the home.   Wears a seatbelt.   Social Determinants of Health   Financial Resource Strain:   . Difficulty of Paying Living Expenses: Not on file  Food Insecurity:   . Worried About Charity fundraiser in the Last Year: Not on file  . Ran Out of Food in the Last Year: Not on file  Transportation Needs:   . Lack of Transportation (Medical): Not on file  . Lack of Transportation (Non-Medical): Not on file  Physical Activity:   . Days of Exercise per Week: Not on file  . Minutes of Exercise per Session: Not on file  Stress:   . Feeling of Stress : Not on file  Social Connections:   . Frequency of Communication with Friends and Family: Not on file  . Frequency of Social Gatherings with Friends and Family: Not on file  . Attends Religious Services: Not on file  . Active Member of Clubs or Organizations: Not on file  . Attends Archivist Meetings: Not on file  . Marital Status: Not on file  Intimate Partner Violence:   . Fear of Current or Ex-Partner: Not on file  . Emotionally Abused: Not on file  . Physically Abused: Not on file  . Sexually Abused: Not on file   Allergies as of 04/17/2020   No Known Allergies     Medication List       Accurate as of April 17, 2020 11:59 PM. If you have any questions, ask your nurse or doctor.        amLODipine 10 MG tablet Commonly known as: NORVASC TAKE 1 TABLET(10 MG) BY MOUTH DAILY   aspirin 81 MG tablet Take 81 mg by mouth daily.   Blood  Pressure Kit Devi 1 Device by Does not apply route daily. Large cuff   lisinopril 30 MG tablet Commonly known as: ZESTRIL TAKE 1 TABLET(30 MG) BY MOUTH DAILY   Omega-3 1000 MG Caps Take by  mouth.   Synthroid 88 MCG tablet Generic drug: levothyroxine Take 88 mcg by mouth every morning.   Vitamin D3 125 MCG (5000 UT) Caps Take 1 capsule by mouth.       All past medical history, surgical history, allergies, family history, immunizations andmedications were updated in the EMR today and reviewed under the history and medication portions of their EMR.     No results found.   ROS: 14 pt review of systems performed and negative (unless mentioned in an HPI)  Objective: BP 130/80   Pulse 87   Temp 98.5 F (36.9 C) (Oral)   Ht '5\' 2"'  (1.575 m)   Wt 129 lb (58.5 kg)   SpO2 100%   BMI 23.59 kg/m  Gen: Afebrile. No acute distress.  HENT: AT. [place].  Eyes:Pupils Equal Round Reactive to light, Extraocular movements intact,  Conjunctiva without redness, discharge or icterus. Neck/lymp/endocrine: Supple, no lymphadenopathy CV: RRR 1/6 SM, no edema Chest: CTAB, no wheeze or crackles  Skin: no rashes, purpura or petechiae.  Neuro:  Normal gait. PERLA. EOMi. Alert. Oriented x3  Psych: Normal affect, dress and demeanor. Normal speech. Normal thought content and judgment.  Assessment/plan: Paula Haynes is a 84 y.o. female present for  Essential hypertension - stable -Continue lisinopril 30 mg QD -  Continue amlodipine to 10 mg daily -Discussed optimal control of her blood pressure, consuming a healthy diet and exercise will help preserve the remainder of her kidney function. -Avoid NSAIDs. -Renally dose meds. -Repeat renal panel every 4-6 months> following with nephrology - PTH/calcium/phosphorus/vitamin D every 6-12 months - f/u 5.5 months.   CKD 3/hyperkalemia/elevated immunoglobulins -Avoid NSAIDs -Renally dose medications when appropriate. -Renal panel every 4-6 months>  reviewed labs completed at nephrology.  -PTH, calcium, vitamin D every 6 months > 08/2019 -Caution with high potassium and sodium meals.  She typically eats a few bananas a day, she was cautioned on hyperkalemia.  She was educated to avoid high sodium meals, but also salt substitutes which are potassium based. -DASH diet encouraged. -If nephrology feels necessary, continue routine follow-up.  As long as no further evaluation on elevated immunoglobulins are necessary and kidney function stays stable, okay to follow-up routinely here.  Influenza vaccine administered today.  Orders Placed This Encounter  Procedures  . Flu Vaccine QUAD High Dose(Fluad)   Meds ordered this encounter  Medications  . amLODipine (NORVASC) 10 MG tablet    Sig: TAKE 1 TABLET(10 MG) BY MOUTH DAILY    Dispense:  90 tablet    Refill:  1  . lisinopril (ZESTRIL) 30 MG tablet    Sig: TAKE 1 TABLET(30 MG) BY MOUTH DAILY    Dispense:  90 tablet    Refill:  1   Referral Orders  No referral(s) requested today       Note is dictated utilizing voice recognition software. Although note has been proof read prior to signing, occasional typographical errors still can be missed. If any questions arise, please do not hesitate to call for verification.  Electronically signed by: Howard Pouch, DO [place]

## 2020-04-17 NOTE — Patient Instructions (Addendum)
I have refilled your blood pressure medications. Your Blood pressure was normal today.   It was great to see you today    Next appt in 5.5 months.

## 2020-05-01 ENCOUNTER — Other Ambulatory Visit: Payer: Self-pay | Admitting: Family Medicine

## 2020-05-01 DIAGNOSIS — N1832 Chronic kidney disease, stage 3b: Secondary | ICD-10-CM | POA: Diagnosis not present

## 2020-05-28 DIAGNOSIS — I10 Essential (primary) hypertension: Secondary | ICD-10-CM | POA: Diagnosis not present

## 2020-05-28 DIAGNOSIS — N184 Chronic kidney disease, stage 4 (severe): Secondary | ICD-10-CM | POA: Diagnosis not present

## 2020-06-18 ENCOUNTER — Telehealth: Payer: Self-pay | Admitting: Family Medicine

## 2020-06-18 NOTE — Telephone Encounter (Signed)
Pt was informed that it is recommended for her age group. Pt states she has an appointment for Saturday at [facility]

## 2020-06-18 NOTE — Telephone Encounter (Signed)
Patient would like to know if it is recommended for her to get the Covid booster shot. Please call patient to advise.

## 2020-07-02 DIAGNOSIS — M810 Age-related osteoporosis without current pathological fracture: Secondary | ICD-10-CM | POA: Diagnosis not present

## 2020-07-02 DIAGNOSIS — E890 Postprocedural hypothyroidism: Secondary | ICD-10-CM | POA: Diagnosis not present

## 2020-07-02 DIAGNOSIS — N189 Chronic kidney disease, unspecified: Secondary | ICD-10-CM | POA: Diagnosis not present

## 2020-07-02 DIAGNOSIS — I10 Essential (primary) hypertension: Secondary | ICD-10-CM | POA: Diagnosis not present

## 2020-07-02 DIAGNOSIS — E559 Vitamin D deficiency, unspecified: Secondary | ICD-10-CM | POA: Diagnosis not present

## 2020-07-12 DIAGNOSIS — H26492 Other secondary cataract, left eye: Secondary | ICD-10-CM | POA: Diagnosis not present

## 2020-08-01 DIAGNOSIS — I10 Essential (primary) hypertension: Secondary | ICD-10-CM | POA: Diagnosis not present

## 2020-08-01 DIAGNOSIS — M545 Low back pain, unspecified: Secondary | ICD-10-CM | POA: Insufficient documentation

## 2020-08-29 ENCOUNTER — Other Ambulatory Visit: Payer: Self-pay | Admitting: Family Medicine

## 2020-09-06 DIAGNOSIS — E890 Postprocedural hypothyroidism: Secondary | ICD-10-CM | POA: Diagnosis not present

## 2020-09-17 ENCOUNTER — Ambulatory Visit: Payer: PPO | Admitting: Family Medicine

## 2020-09-21 ENCOUNTER — Ambulatory Visit: Payer: PPO | Admitting: Family Medicine

## 2020-09-26 ENCOUNTER — Other Ambulatory Visit: Payer: Self-pay

## 2020-09-26 ENCOUNTER — Encounter: Payer: Self-pay | Admitting: Family Medicine

## 2020-09-26 ENCOUNTER — Ambulatory Visit (INDEPENDENT_AMBULATORY_CARE_PROVIDER_SITE_OTHER): Payer: PPO | Admitting: Family Medicine

## 2020-09-26 VITALS — BP 123/70 | HR 80 | Temp 97.9°F | Ht 62.0 in | Wt 131.0 lb

## 2020-09-26 DIAGNOSIS — E038 Other specified hypothyroidism: Secondary | ICD-10-CM | POA: Diagnosis not present

## 2020-09-26 DIAGNOSIS — D696 Thrombocytopenia, unspecified: Secondary | ICD-10-CM | POA: Diagnosis not present

## 2020-09-26 DIAGNOSIS — N183 Chronic kidney disease, stage 3 unspecified: Secondary | ICD-10-CM | POA: Diagnosis not present

## 2020-09-26 DIAGNOSIS — E559 Vitamin D deficiency, unspecified: Secondary | ICD-10-CM | POA: Diagnosis not present

## 2020-09-26 DIAGNOSIS — I10 Essential (primary) hypertension: Secondary | ICD-10-CM

## 2020-09-26 DIAGNOSIS — M810 Age-related osteoporosis without current pathological fracture: Secondary | ICD-10-CM

## 2020-09-26 DIAGNOSIS — D638 Anemia in other chronic diseases classified elsewhere: Secondary | ICD-10-CM

## 2020-09-26 DIAGNOSIS — E875 Hyperkalemia: Secondary | ICD-10-CM | POA: Diagnosis not present

## 2020-09-26 DIAGNOSIS — E890 Postprocedural hypothyroidism: Secondary | ICD-10-CM

## 2020-09-26 DIAGNOSIS — E063 Autoimmune thyroiditis: Secondary | ICD-10-CM

## 2020-09-26 MED ORDER — AMLODIPINE BESYLATE 10 MG PO TABS
ORAL_TABLET | ORAL | 1 refills | Status: DC
Start: 2020-09-26 — End: 2020-11-27

## 2020-09-26 MED ORDER — LISINOPRIL 30 MG PO TABS
ORAL_TABLET | ORAL | 1 refills | Status: DC
Start: 2020-09-26 — End: 2020-11-27

## 2020-09-26 NOTE — Progress Notes (Signed)
Patient ID: Paula Haynes, female  DOB: 05-Apr-1936, 85 y.o.   MRN: [id number] Patient Care Team    Relationship Specialty Notifications Start End  Paula Hillock, DO PCP - General Family Medicine  12/28/17   Paula Pi, MD Consulting Physician Endocrinology  11/16/17   Paula Iba, MD  Nephrology  08/30/19   Paula Patty, MD  Dermatology  08/30/19   Paula Haynes, [place] Consulting Physician Podiatry  08/30/19   Paula Day, MD Consulting Physician Orthopedic Surgery  08/30/19   Paula Koyanagi, MD Referring Physician Ophthalmology  08/30/19     Chief Complaint  Patient presents with  . Follow-up    [facility]; pt is not fasting    Subjective: Paula Haynes is a 85 y.o.  female present for follow up Hypertension/CKD3/mild anemia: Pt reports compliance   with amlodipine 10 mg daily and lisinopril 30 mg QD. Patient denies chest pain, shortness of breath, dizziness or lower extremity edema.  Prior note: She has been referred to nephrology for her decreasing kidney function since 2013.  GFR last 37 (her baseline is 37-44).  SHe was also found to have a very mild elevation in protein (8.3) in her blood which was new for her.  SPEP was without M spike.  IgGs abnormal.  Renal ultrasound completed at nephrology 06/21/2019 with cortical volume loss reported.  Nephrology felt her worsening kidney function was secondary to hypertension and atherosclerosis. Pt takes a  daily baby ASA. Pt is  Not prescribed statin.  She takes omega-3 1000 mg a Haynes. Diet: No routine diet.  Patient states she eats anything she wants.  She does have higher sodium content diet Exercise: No routine exercise, but she is very active in her house and in her yard.   Depression screen [facility] 2/9 09/26/2020 02/22/2019 12/28/2017 11/16/2017 02/15/2016  Decreased Interest 0 0 0 1 0  Down, Depressed, Hopeless 0 0 0 0 0  PHQ - 2 Score 0 0 0 1 0   No flowsheet data found.   Immunization History  Administered Date(s) Administered   . Fluad Quad(high Dose 65+) 04/11/2019, 04/17/2020  . Influenza Split 05/02/2011  . Influenza, High Dose Seasonal PF 06/17/2016, 07/23/2018  . Influenza,inj,Quad PF,6+ Mos 05/19/2014  . PFIZER(Purple Top)SARS-COV-2 Vaccination 10/31/2019, 11/28/2019, 06/23/2020  . Pneumococcal Conjugate-13 06/17/2016  . Pneumococcal Polysaccharide-23 01/22/2012  . Zoster 01/22/2012    No exam data present  Past Medical History:  Diagnosis Date  . Arthritis   . History of blood product transfusion   . Hyperproteinemia 02/28/2019  . Hypertension   . Thyroid disease    No Known Allergies Past Surgical History:  Procedure Laterality Date  . FINGER SURGERY     right middle finger/due to glass window cutting finger  . REFRACTIVE SURGERY     bil   Family History  Problem Relation Age of Onset  . Heart disease Father   . Arthritis Other        parent  . Hypertension Other        parent  . Stroke Other        parent   Social History   Socioeconomic History  . Marital status: Married    Spouse name: Not on file  . Number of children: Not on file  . Years of education: Not on file  . Highest education level: Not on file  Occupational History  . Not on file  Tobacco Use  . Smoking status: Never Smoker  . Smokeless tobacco:  Never Used  Vaping Use  . Vaping Use: Never used  Substance and Sexual Activity  . Alcohol use: No    Alcohol/week: 0.0 standard drinks  . Drug use: No  . Sexual activity: Not Currently  Other Topics Concern  . Not on file  Social History Narrative   Widowed.  College-educated for years plus.  Self-employed/retired.   Takes a daily vitamin.   Drinks caffeine.   Smoke alarm in the home.   Wears a seatbelt.   Social Determinants of Health   Financial Resource Strain: Not on file  Food Insecurity: Not on file  Transportation Needs: Not on file  Physical Activity: Not on file  Stress: Not on file  Social Connections: Not on file  Intimate Partner Violence:  Not on file   Allergies as of 09/26/2020   No Known Allergies     Medication List       Accurate as of September 26, 2020  2:31 PM. If you have any questions, ask your nurse or doctor.        amLODipine 10 MG tablet Commonly known as: NORVASC TAKE 1 TABLET(10 MG) BY MOUTH DAILY   aspirin 81 MG tablet Take 81 mg by mouth daily.   Blood Pressure Kit Devi 1 Device by Does not apply route daily. Large cuff   lisinopril 30 MG tablet Commonly known as: ZESTRIL TAKE 1 TABLET(30 MG) BY MOUTH DAILY   Omega-3 1000 MG Caps Take by mouth.   Prolia 60 MG/ML Sosy injection Generic drug: denosumab See admin instructions.   sodium bicarbonate 650 MG tablet Take by mouth.   Synthroid 100 MCG tablet Generic drug: levothyroxine Take 100 mcg by mouth daily. What changed: Another medication with the same name was removed. Continue taking this medication, and follow the directions you see here. Changed by: Howard Pouch, DO   Vitamin D3 125 MCG (5000 UT) Caps Take 1 capsule by mouth.       All past medical history, surgical history, allergies, family history, immunizations andmedications were updated in the EMR today and reviewed under the history and medication portions of their EMR.     No results found.   ROS: 14 pt review of systems performed and negative (unless mentioned in an HPI)  Objective: BP 123/70   Pulse 80   Temp 97.9 F (36.6 C) (Oral)   Ht _0  (1.575 m)   Wt 131 lb (59.4 kg)   SpO2 98%   BMI 23.96 kg/m  Gen: Afebrile. No acute distress. Nontoxic, pleasant female.  HENT: AT. [place]. No cough.  Eyes:Pupils Equal Round Reactive to light, Extraocular movements intact,  Conjunctiva without redness, discharge or icterus. Neck/lymp/endocrine: Supple,no lymphadenopathy, no thyromegaly CV: RRR 1/6 SM, no edema, +2/4 P posterior tibialis pulses Chest: CTAB, no wheeze or crackles Skin: no rashes, purpura or petechiae.  Neuro: Alert. Oriented x3 Psych: Normal affect,  dress and demeanor. Normal speech. Normal thought content and judgment..    Assessment/plan: Paula Haynes is a 85 y.o. female present for  Essential hypertension - stable.  - continue  lisinopril 30 mg QD - continue  amlodipine to 10 mg daily -Discussed optimal control of her blood pressure, consuming a healthy diet and exercise will help preserve the remainder of her kidney function. -Avoid NSAIDs. -Renally dose meds. - f/u 5.5 months.   CKD 3/hyperkalemia/elevated immunoglobulins -Avoid NSAIDs -Renally dose medications when appropriate. -Renal panel every 4-6 months> reviewed labs completed at nephrology.  -PTH, calcium, vitamin D every  6 months > nephrology completing -Caution with high potassium and sodium meals.   -DASH diet encouraged. - pth/ca/renal panel collected today -If nephrology feels necessary, continue routine follow-up.  As long as no further evaluation on elevated immunoglobulins are necessary and kidney function stays stable, okay to follow-up routinely here.    Orders Placed This Encounter  Procedures  . CBC  . PTH, Intact and Calcium  . Direct LDL  . Renal Function Panel   Meds ordered this encounter  Medications  . lisinopril (ZESTRIL) 30 MG tablet    Sig: TAKE 1 TABLET(30 MG) BY MOUTH DAILY    Dispense:  90 tablet    Refill:  1  . amLODipine (NORVASC) 10 MG tablet    Sig: TAKE 1 TABLET(10 MG) BY MOUTH DAILY    Dispense:  90 tablet    Refill:  1   Referral Orders  No referral(s) requested today       Note is dictated utilizing voice recognition software. Although note has been proof read prior to signing, occasional typographical errors still can be missed. If any questions arise, please do not hesitate to call for verification.  Electronically signed by: Howard Pouch, DO [place]

## 2020-09-26 NOTE — Patient Instructions (Signed)
Nice to see you today.  I have refilled your medications.  BP looks great  We will call you with lab results.   Next appt in 5.5 months.

## 2020-09-29 LAB — RENAL PROFILE WITH ESTIMATED GFR
Albumin: 4.4 g/dL (ref 3.6–5.1)
BUN/Creatinine Ratio: 34 (calc) — ABNORMAL HIGH (ref 6–22)
BUN: 53 mg/dL — ABNORMAL HIGH (ref 7–25)
CO2: 14 mmol/L — ABNORMAL LOW (ref 20–32)
Calcium: 9.5 mg/dL (ref 8.6–10.4)
Chloride: 111 mmol/L — ABNORMAL HIGH (ref 98–110)
Creat: 1.57 mg/dL — ABNORMAL HIGH (ref 0.60–0.88)
GFR, Est African American: 35 mL/min/{1.73_m2} — ABNORMAL LOW (ref >=60)
GFR, Est Non African American: 30 mL/min/{1.73_m2} — ABNORMAL LOW (ref >=60)
Glucose, Bld: 92 mg/dL (ref 65–99)
Phosphorus: 4.8 mg/dL — ABNORMAL HIGH (ref 2.1–4.3)
Potassium: 5.7 mmol/L — ABNORMAL HIGH (ref 3.5–5.3)
Sodium: 149 mmol/L — ABNORMAL HIGH (ref 135–146)

## 2020-09-29 LAB — LDL CHOLESTEROL, DIRECT: Direct LDL: 108 mg/dL — ABNORMAL HIGH (ref <100)

## 2020-09-29 LAB — CBC
HCT: 51.3 % — ABNORMAL HIGH (ref 35.0–45.0)
Hemoglobin: 17.7 g/dL — ABNORMAL HIGH (ref 11.7–15.5)
MCH: 31.2 pg (ref 27.0–33.0)
MCHC: 34.5 g/dL (ref 32.0–36.0)
MCV: 90.3 fL (ref 80.0–100.0)
MPV: 11.5 fL (ref 7.5–12.5)
Platelets: 337 10*3/uL (ref 140–400)
RBC: 5.68 10*6/uL — ABNORMAL HIGH (ref 3.80–5.10)
RDW: 13.1 % (ref 11.0–15.0)
WBC: 7.7 10*3/uL (ref 3.8–10.8)

## 2020-09-29 LAB — PTH, INTACT AND CALCIUM
Calcium: 9.2 mg/dL (ref 8.6–10.4)
PTH: 46 pg/mL (ref 14–64)

## 2020-10-01 ENCOUNTER — Telehealth: Payer: Self-pay | Admitting: Family Medicine

## 2020-10-01 NOTE — Telephone Encounter (Signed)
Please inform patient the following information: Her sodium and potassium levels are high.  She needs to increase her water consumption.  She also needs to call her kidney doctor ASAP and be seen so they can slowly decrease her sodium bicarb pills to lower her sodium in her blood.  *IT IS VERY IMPORTANT to lower sodium slowly and not abruptly stop supplement.*  Until she can be seen by them-  Increase hydration and decrease sodium bicarb by 1 pill for 1 week, then in 1 week remove another pill.

## 2020-10-01 NOTE — Telephone Encounter (Signed)
Patient advised and voiced understanding.  

## 2020-10-01 NOTE — Telephone Encounter (Signed)
LM for pt to return call to discuss.  

## 2020-10-09 DIAGNOSIS — M545 Low back pain, unspecified: Secondary | ICD-10-CM | POA: Diagnosis not present

## 2020-10-12 DIAGNOSIS — H26492 Other secondary cataract, left eye: Secondary | ICD-10-CM | POA: Diagnosis not present

## 2020-10-17 DIAGNOSIS — M5416 Radiculopathy, lumbar region: Secondary | ICD-10-CM

## 2020-10-17 DIAGNOSIS — I10 Essential (primary) hypertension: Secondary | ICD-10-CM | POA: Diagnosis not present

## 2020-10-17 DIAGNOSIS — M1990 Unspecified osteoarthritis, unspecified site: Secondary | ICD-10-CM | POA: Insufficient documentation

## 2020-10-17 HISTORY — DX: Radiculopathy, lumbar region: M54.16

## 2020-10-22 DIAGNOSIS — E875 Hyperkalemia: Secondary | ICD-10-CM | POA: Diagnosis not present

## 2020-10-22 DIAGNOSIS — E872 Acidosis: Secondary | ICD-10-CM | POA: Diagnosis not present

## 2020-10-22 DIAGNOSIS — N1832 Chronic kidney disease, stage 3b: Secondary | ICD-10-CM | POA: Diagnosis not present

## 2020-10-22 DIAGNOSIS — E870 Hyperosmolality and hypernatremia: Secondary | ICD-10-CM | POA: Diagnosis not present

## 2020-10-28 ENCOUNTER — Other Ambulatory Visit: Payer: Self-pay | Admitting: Family Medicine

## 2020-11-05 DIAGNOSIS — M5116 Intervertebral disc disorders with radiculopathy, lumbar region: Secondary | ICD-10-CM | POA: Diagnosis not present

## 2020-11-05 DIAGNOSIS — M5416 Radiculopathy, lumbar region: Secondary | ICD-10-CM | POA: Diagnosis not present

## 2020-11-14 ENCOUNTER — Emergency Department (HOSPITAL_COMMUNITY): Payer: PPO

## 2020-11-14 ENCOUNTER — Telehealth: Payer: Self-pay

## 2020-11-14 ENCOUNTER — Observation Stay (HOSPITAL_COMMUNITY)
Admission: EM | Admit: 2020-11-14 | Discharge: 2020-11-15 | Disposition: A | Discharge status: Home / Self Care | Payer: PPO | Attending: Internal Medicine | Admitting: Internal Medicine

## 2020-11-14 ENCOUNTER — Other Ambulatory Visit: Payer: Self-pay

## 2020-11-14 DIAGNOSIS — R000 Tachycardia, unspecified: Secondary | ICD-10-CM | POA: Diagnosis not present

## 2020-11-14 DIAGNOSIS — E039 Hypothyroidism, unspecified: Secondary | ICD-10-CM | POA: Diagnosis present

## 2020-11-14 DIAGNOSIS — Z20822 Contact with and (suspected) exposure to covid-19: Secondary | ICD-10-CM | POA: Diagnosis not present

## 2020-11-14 DIAGNOSIS — N1832 Chronic kidney disease, stage 3b: Secondary | ICD-10-CM | POA: Insufficient documentation

## 2020-11-14 DIAGNOSIS — R531 Weakness: Secondary | ICD-10-CM | POA: Diagnosis not present

## 2020-11-14 DIAGNOSIS — I10 Essential (primary) hypertension: Secondary | ICD-10-CM | POA: Diagnosis not present

## 2020-11-14 DIAGNOSIS — R29810 Facial weakness: Secondary | ICD-10-CM | POA: Diagnosis not present

## 2020-11-14 DIAGNOSIS — Z79899 Other long term (current) drug therapy: Secondary | ICD-10-CM | POA: Diagnosis not present

## 2020-11-14 DIAGNOSIS — I639 Cerebral infarction, unspecified: Principal | ICD-10-CM | POA: Insufficient documentation

## 2020-11-14 DIAGNOSIS — G319 Degenerative disease of nervous system, unspecified: Secondary | ICD-10-CM | POA: Diagnosis not present

## 2020-11-14 DIAGNOSIS — Z7982 Long term (current) use of aspirin: Secondary | ICD-10-CM | POA: Insufficient documentation

## 2020-11-14 DIAGNOSIS — D638 Anemia in other chronic diseases classified elsewhere: Secondary | ICD-10-CM | POA: Diagnosis not present

## 2020-11-14 DIAGNOSIS — N183 Chronic kidney disease, stage 3 unspecified: Secondary | ICD-10-CM | POA: Diagnosis present

## 2020-11-14 DIAGNOSIS — Y900 Blood alcohol level of less than 20 mg/100 ml: Secondary | ICD-10-CM | POA: Diagnosis not present

## 2020-11-14 DIAGNOSIS — I499 Cardiac arrhythmia, unspecified: Secondary | ICD-10-CM | POA: Diagnosis not present

## 2020-11-14 DIAGNOSIS — R8271 Bacteriuria: Secondary | ICD-10-CM | POA: Insufficient documentation

## 2020-11-14 DIAGNOSIS — R200 Anesthesia of skin: Secondary | ICD-10-CM | POA: Diagnosis not present

## 2020-11-14 DIAGNOSIS — I129 Hypertensive chronic kidney disease with stage 1 through stage 4 chronic kidney disease, or unspecified chronic kidney disease: Secondary | ICD-10-CM | POA: Diagnosis not present

## 2020-11-14 DIAGNOSIS — R9082 White matter disease, unspecified: Secondary | ICD-10-CM | POA: Diagnosis not present

## 2020-11-14 LAB — CBC WITH DIFFERENTIAL/PLATELET
Abs Immature Granulocytes: 0.05 10*3/uL (ref 0.00–0.07)
Basophils Absolute: 0 10*3/uL (ref 0.0–0.1)
Basophils Relative: 0 %
Eosinophils Absolute: 0 10*3/uL (ref 0.0–0.5)
Eosinophils Relative: 0 %
HCT: 34.7 % — ABNORMAL LOW (ref 36.0–46.0)
Hemoglobin: 11.3 g/dL — ABNORMAL LOW (ref 12.0–15.0)
Immature Granulocytes: 1 %
Lymphocytes Relative: 12 %
Lymphs Abs: 1 10*3/uL (ref 0.7–4.0)
MCH: 30.1 pg (ref 26.0–34.0)
MCHC: 32.6 g/dL (ref 30.0–36.0)
MCV: 92.3 fL (ref 80.0–100.0)
Monocytes Absolute: 0.8 10*3/uL (ref 0.1–1.0)
Monocytes Relative: 9 %
Neutro Abs: 6.6 10*3/uL (ref 1.7–7.7)
Neutrophils Relative %: 78 %
Platelets: 309 10*3/uL (ref 150–400)
RBC: 3.76 MIL/uL — ABNORMAL LOW (ref 3.87–5.11)
RDW: 13.1 % (ref 11.5–15.5)
WBC: 8.5 10*3/uL (ref 4.0–10.5)
nRBC: 0 % (ref 0.0–0.2)

## 2020-11-14 LAB — COMPREHENSIVE METABOLIC PANEL
ALT: 18 U/L (ref 0–44)
AST: 28 U/L (ref 15–41)
Albumin: 4.1 g/dL (ref 3.5–5.0)
Alkaline Phosphatase: 36 U/L — ABNORMAL LOW (ref 38–126)
Anion gap: 12 (ref 5–15)
BUN: 37 mg/dL — ABNORMAL HIGH (ref 8–23)
CO2: 20 mmol/L — ABNORMAL LOW (ref 22–32)
Calcium: 9.3 mg/dL (ref 8.9–10.3)
Chloride: 104 mmol/L (ref 98–111)
Creatinine, Ser: 1.44 mg/dL — ABNORMAL HIGH (ref 0.44–1.00)
GFR, Estimated: 36 mL/min — ABNORMAL LOW (ref >60)
Glucose, Bld: 100 mg/dL — ABNORMAL HIGH (ref 70–99)
Potassium: 4.1 mmol/L (ref 3.5–5.1)
Sodium: 136 mmol/L (ref 135–145)
Total Bilirubin: 0.7 mg/dL (ref 0.3–1.2)
Total Protein: 8.3 g/dL — ABNORMAL HIGH (ref 6.5–8.1)

## 2020-11-14 LAB — I-STAT CHEM 8, ED
BUN: 45 mg/dL — ABNORMAL HIGH (ref 8–23)
Calcium, Ion: 1.08 mmol/L — ABNORMAL LOW (ref 1.15–1.40)
Chloride: 106 mmol/L (ref 98–111)
Creatinine, Ser: 1.3 mg/dL — ABNORMAL HIGH (ref 0.44–1.00)
Glucose, Bld: 101 mg/dL — ABNORMAL HIGH (ref 70–99)
HCT: 38 % (ref 36.0–46.0)
Hemoglobin: 12.9 g/dL (ref 12.0–15.0)
Potassium: 4.2 mmol/L (ref 3.5–5.1)
Sodium: 138 mmol/L (ref 135–145)
TCO2: 22 mmol/L (ref 22–32)

## 2020-11-14 LAB — URINALYSIS, ROUTINE W REFLEX MICROSCOPIC
Bilirubin Urine: NEGATIVE
Glucose, UA: NEGATIVE mg/dL
Hgb urine dipstick: NEGATIVE
Ketones, ur: NEGATIVE mg/dL
Nitrite: NEGATIVE
Protein, ur: 30 mg/dL — AB
Specific Gravity, Urine: 1.009 (ref 1.005–1.030)
pH: 6 (ref 5.0–8.0)

## 2020-11-14 LAB — RAPID URINE DRUG SCREEN, HOSP PERFORMED
Amphetamines: NOT DETECTED
Barbiturates: NOT DETECTED
Benzodiazepines: NOT DETECTED
Cocaine: NOT DETECTED
Opiates: NOT DETECTED
Tetrahydrocannabinol: NOT DETECTED

## 2020-11-14 LAB — PROTIME-INR
INR: 1 (ref 0.8–1.2)
Prothrombin Time: 12.7 seconds (ref 11.4–15.2)

## 2020-11-14 LAB — RESP PANEL BY RT-PCR (FLU A&B, COVID) ARPGX2
Influenza A by PCR: NEGATIVE
Influenza B by PCR: NEGATIVE
SARS Coronavirus 2 by RT PCR: NEGATIVE

## 2020-11-14 LAB — ETHANOL: Alcohol, Ethyl (B): <10 mg/dL (ref <10)

## 2020-11-14 LAB — APTT: aPTT: 25 seconds (ref 24–36)

## 2020-11-14 MED ORDER — NAPHAZOLINE-PHENIRAMINE 0.027-0.315 % OP SOLN: 1.0000 [drp] | Freq: Four times a day (QID) | OPHTHALMIC | Status: DC | PRN

## 2020-11-14 MED ORDER — ATORVASTATIN CALCIUM 40 MG PO TABS
40.0000 mg | ORAL_TABLET | Freq: Every day | ORAL | Status: DC
  Administered 2020-11-15: 40 mg via ORAL
  Filled 2020-11-14: qty 1

## 2020-11-14 MED ORDER — ACETAMINOPHEN 325 MG PO TABS
650.0000 mg | ORAL_TABLET | ORAL | Status: DC | PRN
Start: 2020-11-14 — End: 2020-11-15

## 2020-11-14 MED ORDER — SENNOSIDES-DOCUSATE SODIUM 8.6-50 MG PO TABS: 1.0000 | ORAL_TABLET | Freq: Every evening | ORAL | Status: DC | PRN

## 2020-11-14 MED ORDER — ENOXAPARIN SODIUM 30 MG/0.3ML ~~LOC~~ SOLN
30.0000 mg | SUBCUTANEOUS | Status: DC
  Administered 2020-11-14: 30 mg via SUBCUTANEOUS
  Filled 2020-11-14: qty 0.3

## 2020-11-14 MED ORDER — STROKE: EARLY STAGES OF RECOVERY BOOK
Freq: Once | Status: DC
  Filled 2020-11-14: qty 1

## 2020-11-14 MED ORDER — LEVOTHYROXINE SODIUM 100 MCG PO TABS
100.0000 ug | ORAL_TABLET | Freq: Every day | ORAL | Status: DC
  Administered 2020-11-15: 100 ug via ORAL
  Filled 2020-11-14: qty 1

## 2020-11-14 MED ORDER — ACETAMINOPHEN 160 MG/5ML PO SOLN
650.0000 mg | ORAL | Status: DC | PRN
Start: 2020-11-14 — End: 2020-11-15

## 2020-11-14 MED ORDER — LISINOPRIL 20 MG PO TABS
30.0000 mg | ORAL_TABLET | Freq: Every day | ORAL | Status: DC
  Administered 2020-11-14: 30 mg via ORAL
  Filled 2020-11-14: qty 1

## 2020-11-14 MED ORDER — ASPIRIN 81 MG PO CHEW: 81.0000 mg | CHEWABLE_TABLET | Freq: Every day | ORAL | Status: DC

## 2020-11-14 MED ORDER — SODIUM BICARBONATE 650 MG PO TABS
1300.0000 mg | ORAL_TABLET | Freq: Two times a day (BID) | ORAL | Status: DC
  Administered 2020-11-14 – 2020-11-15 (×2): 1300 mg via ORAL
  Filled 2020-11-14 (×2): qty 2

## 2020-11-14 MED ORDER — ACETAMINOPHEN 650 MG RE SUPP: 650.0000 mg | RECTAL | Status: DC | PRN

## 2020-11-14 NOTE — ED Notes (Signed)
Patient transported to CT 

## 2020-11-14 NOTE — ED Notes (Signed)
Patient transported to MRI 

## 2020-11-14 NOTE — Telephone Encounter (Addendum)
FYI, called EMS while keeping an eye on patient to see if any physical changes noted besides facial drooping. Provided as much information as I could regarding current pt condition. Printed off last o/v notes with PCP to provide to EMS upon arrival. I was able to speak with one of her granddaughters as well to inform of current situation. She is currently at [facility] ED for further evaluation.

## 2020-11-14 NOTE — H&P (Addendum)
History and Physical   Paula Haynes ION:[id number] DOB: 15-Dec-1935 DOA: 11/14/2020  PCP: Ma Hillock, DO   Patient coming from: Home  Chief Complaint: Facial numbness  HPI: Paula Haynes is a 85 y.o. female with medical history significant of anemia, CKD, hypertension, hypothyroidism, elevated protein who presents with left facial numbness.  Patient had been experiencing some left facial numbness since around 9 AM yesterday.  He had been persistent with some mild improvement over the past day. She describes the numbness as a numbness and tingling sensation of her left lips and roof of her mouth.  She denies any other numbness nor weakness nor changes that she has noticed.  Due to the persistent of her symptoms she went by her PCPs office today as a walk-in and after stating her problem and initial evaluation EMS was called to transport patient to the ED.  She denies fevers, chills, chest pain, shortness of breath, abdominal pain, constipation, diarrhea, nausea, vomiting.  ED Course: Vital signs in the ED significant for initial tachycardia now improved to the 70s to 80s.  Blood pressure in the 244W to 102V systolic.  Lab work-up showed CMP with bicarb 20, BUN stable at 37, creatinine stable at 1.44, protein stably elevated at 8.3.  CBC showed hemoglobin stable at 11.3.  PTT, PT, INR within normal limits.  Respiratory panel for flu and COVID is negative.  Urinalysis showed leukocytes, bacteria, protein.  UDS negative and ethanol level negative.  CT head showed no acute abnormality did shows a determinate lacunar infarcts.  MR brain showed acute right thalamus infarct and extensive old small vessel infarcts.  Neurology was consulted in the ED recommend admission for stroke work-up.  Review of Systems: As per HPI otherwise all other systems reviewed and are negative.  Past Medical History:  Diagnosis Date  . Arthritis   . History of blood product transfusion   . Hyperproteinemia 02/28/2019   . Hypertension   . Thyroid disease     Past Surgical History:  Procedure Laterality Date  . FINGER SURGERY     right middle finger/due to glass window cutting finger  . REFRACTIVE SURGERY     bil    Social History  reports that she has never smoked. She has never used smokeless tobacco. She reports that she does not drink alcohol and does not use drugs.  No Known Allergies  Family History  Problem Relation Age of Onset  . Heart disease Father   . Arthritis Other        parent  . Hypertension Other        parent  . Stroke Other        parent  Reviewed on admission  Prior to Admission medications   Medication Sig Start Date End Date Taking? Authorizing Provider  amLODipine (NORVASC) 10 MG tablet TAKE 1 TABLET(10 MG) BY MOUTH DAILY Patient taking differently: Take 10 mg by mouth in the morning. 09/26/20  Yes Kuneff, Renee A, DO  aspirin 81 MG tablet Take 81 mg by mouth at bedtime.   Yes [provider]  Cholecalciferol (VITAMIN D3) 5000 units CAPS Take 5,000 Units by mouth at bedtime.   Yes [provider]  denosumab (PROLIA) 60 MG/ML SOSY injection Inject into the skin every 6 (six) months. 11/06/17  Yes [provider]  lisinopril (ZESTRIL) 30 MG tablet TAKE 1 TABLET(30 MG) BY MOUTH DAILY Patient taking differently: Take 30 mg by mouth daily at 4 PM. 09/26/20  Yes Kuneff,  Renee A, DO  Naphazoline-Pheniramine (OPCON-A) 0.027-0.315 % SOLN Place 1-2 drops into both eyes 4 (four) times daily as needed (for seasonal allergies).   Yes [provider]  NON FORMULARY Take 1 capsule by mouth See admin instructions. Omega-3 capsule: Take 1 capsule by mouth at bedtime   Yes [provider]  sodium bicarbonate 650 MG tablet Take 1,300 mg by mouth 2 (two) times daily. 07/27/20  Yes [provider]  SYNTHROID 100 MCG tablet Take 100 mcg by mouth daily before breakfast. 08/28/20  Yes [provider]  Blood Pressure Monitoring (BLOOD  PRESSURE KIT) DEVI 1 Device by Does not apply route daily. Large cuff 01/08/18   Howard Pouch A, DO    Physical Exam: Vitals:   11/14/20 1852 11/14/20 1917 11/14/20 2015 11/14/20 2115  BP: (!) 173/81 (!) 170/91 (!) 157/95 (!) 165/85  Pulse: 73 76 72 77  Resp: _0 Temp:      TempSrc:      SpO2: 100% 100% 100% 100%  Weight:      Height:       Physical Exam Constitutional:      General: She is not in acute distress.    Appearance: Normal appearance.  HENT:     Head: Normocephalic and atraumatic.     Mouth/Throat:     Mouth: Mucous membranes are moist.     Pharynx: Oropharynx is clear.  Eyes:     Extraocular Movements: Extraocular movements intact.     Pupils: Pupils are equal, round, and reactive to light.  Cardiovascular:     Rate and Rhythm: Normal rate and regular rhythm.     Pulses: Normal pulses.     Heart sounds: Normal heart sounds.  Pulmonary:     Effort: Pulmonary effort is normal. No respiratory distress.     Breath sounds: Normal breath sounds.  Abdominal:     General: Bowel sounds are normal. There is no distension.     Palpations: Abdomen is soft.     Tenderness: There is no abdominal tenderness.  Musculoskeletal:        General: No swelling or deformity.  Skin:    General: Skin is warm and dry.  Neurological:     Comments: Mental Status: Patient is awake, alert, oriented x3 No signs of aphasia or neglect Cranial Nerves: II: Pupils equal, round, and reactive to light.  III,IV, VI: EOMI without ptosis or diploplia.  V: Facial sensation is symmetric tolight touch andTemperature, except for decreased sensation with numbness at left lip. VII: Facial movement is symmetric.  VIII: hearing is intact to voice X: Uvula elevates symmetrically XI: Shoulder shrug is symmetric. XII: tongue is midline without atrophy or fasciculations.  Motor: Good effort thorughout, at Least 5/5 bilateral UE, 5/5 bilateral lower extremitiy Sensory: Sensation is  grossly intact bilateral UEs & LEs     Labs on Admission: I have personally reviewed following labs and imaging studies  CBC: Recent Labs  Lab 11/14/20 1351 11/14/20 1355  WBC 8.5  --   NEUTROABS 6.6  --   HGB 11.3* 12.9  HCT 34.7* 38.0  MCV 92.3  --   PLT 309  --     Basic Metabolic Panel: Recent Labs  Lab 11/14/20 1322 11/14/20 1355  NA 136 138  K 4.1 4.2  CL 104 106  CO2 20*  --   GLUCOSE 100* 101*  BUN 37* 45*  CREATININE 1.44* 1.30*  CALCIUM 9.3  --  GFR: Estimated Creatinine Clearance: 25.9 mL/min (A) (by C-G formula based on SCr of 1.3 mg/dL (H)).  Liver Function Tests: Recent Labs  Lab 11/14/20 1322  AST 28  ALT 18  ALKPHOS 36*  BILITOT 0.7  PROT 8.3*  ALBUMIN 4.1    Urine analysis:    Component Value Date/Time   COLORURINE YELLOW 11/14/2020 1254   APPEARANCEUR HAZY (A) 11/14/2020 1254   LABSPEC 1.009 11/14/2020 1254   PHURINE 6.0 11/14/2020 1254   GLUCOSEU NEGATIVE 11/14/2020 1254   HGBUR NEGATIVE 11/14/2020 1254   BILIRUBINUR NEGATIVE 11/14/2020 1254   KETONESUR NEGATIVE 11/14/2020 1254   PROTEINUR 30 (A) 11/14/2020 1254   NITRITE NEGATIVE 11/14/2020 1254   LEUKOCYTESUR TRACE (A) 11/14/2020 1254    Radiological Exams on Admission: CT HEAD WO CONTRAST  Result Date: 11/14/2020 CLINICAL DATA:  Numbness to left side of mouth. EXAM: CT HEAD WITHOUT CONTRAST TECHNIQUE: Contiguous axial images were obtained from the base of the skull through the vertex without intravenous contrast. COMPARISON:  None. FINDINGS: Brain: There is no evidence for acute hemorrhage, hydrocephalus, mass lesion, or abnormal extra-axial fluid collection. No definite CT evidence for acute infarction. Diffuse loss of parenchymal volume is consistent with atrophy. Patchy low attenuation in the deep hemispheric and periventricular white matter is nonspecific, but likely reflects chronic microvascular ischemic demyelination. Age indeterminate lacunar infarction noted in the  basal ganglia bilaterally. Vascular: No hyperdense vessel or unexpected calcification. Skull: No evidence for fracture. No worrisome lytic or sclerotic lesion. Sinuses/Orbits: The visualized paranasal sinuses and mastoid air cells are clear. Visualized portions of the globes and intraorbital fat are unremarkable. Other: None. IMPRESSION: 1. No acute intracranial abnormality. 2. Atrophy with chronic small vessel white matter ischemic disease. 3. Age indeterminate lacunar infarctions in the basal ganglia bilaterally. Electronically Signed   By: Misty Stanley M.D.   On: 11/14/2020 13:24   MR BRAIN WO CONTRAST  Result Date: 11/14/2020 CLINICAL DATA:  Left facial numbness.  Acute stroke suspected. EXAM: MRI HEAD WITHOUT CONTRAST TECHNIQUE: Multiplanar, multiecho pulse sequences of the brain and surrounding structures were obtained without intravenous contrast. COMPARISON:  Head CT earlier same day. FINDINGS: Brain: Diffusion imaging shows a punctate acute infarction at the right lateral thalamus. No other acute finding. Old small vessel ischemic changes affect the pons. Few old small vessel cerebellar infarctions. Numerous old small vessel infarctions affect the thalami, basal ganglia and hemispheric deep white matter. There is punctate hemosiderin deposition associated with many of the old small vessel insults. No large vessel territory stroke. No mass, acute hemorrhage, hydrocephalus or extra-axial collection. Vascular: Major vessels at the base of the brain show flow. Skull and upper cervical spine: Negative Sinuses/Orbits: Clear/normal Other: None IMPRESSION: 1. Punctate acute infarction at the right lateral thalamus. 2. Extensive old small vessel ischemic changes throughout the brain as outlined above. This includes numerous old bilateral thalamic small-vessel infarctions. Many of the old small vessel insults are associated with hemosiderin deposition, but I do not see evidence of acute hemorrhage by either  today's CT or this MRI. Electronically Signed   By: Nelson Chimes M.D.   On: 11/14/2020 18:38   EKG: EKG unable to be reviewed due to technical difficulties with the EMR.  Assessment/Plan Principal Problem:   Acute CVA (cerebrovascular accident) [facility]) Active Problems:   Hypothyroidism   Essential hypertension   CKD (chronic kidney disease) stage 3, GFR 30-59 ml/min (HCC)   Anemia, chronic disease  Acute CVA > 1 day of left facial numbness around  her lips and roof of her mouth > MRI brain noted right thalamic infarct and extensive old small vessel infarcts > No prior stroke work-up or diagnosis of TIA.  > Seen by neurology who recommend admission and stroke work-up and will follow along. - Neurology consult - Allow for permissive HTN (systolic < 233 and diastolic < 612) - Continue ASA 81 mg daily  - Start statin - Echocardiogram  - Carotid doppler  - A1C  - Lipid panel  - Tele monitoring  - SLP eval - PT/OT  Anemia of chronic disease > Hemoglobin stable at 11.3 -Continue to monitor  CKD 3B > Creatinine stable at 1.44 - Continue to monitor - Continue home sodium bicarb  Hypertension > BP elevated 160s to 180s in the ED - Holding home medications in the setting of permissive hypertension for acute CVA  Hypothyroidism > Postsurgical - Continue home Synthroid  Asymptomatic bacteriuria - No treatment, continue to monitor  DVT prophylaxis: Lovenox  Code Status:   Full  Family Communication:  Patient states that she has been updating her family by phone and one of her family members just left.  She states they do not need an additional update. Disposition Plan:   Patient is from:  Home  Anticipated DC to:  Home  Anticipated DC date:  1 to 2 days  Anticipated DC barriers: None  Consults called:  Neurology consulted by EDP  Admission status:  Observation, telemetry   Severity of Illness: The appropriate patient status for this patient is OBSERVATION. Observation  status is judged to be reasonable and necessary in order to provide the required intensity of service to ensure the patient's safety. The patient's presenting symptoms, physical exam findings, and initial radiographic and laboratory data in the context of their medical condition is felt to place them at decreased risk for further clinical deterioration. Furthermore, it is anticipated that the patient will be medically stable for discharge from the hospital within 2 midnights of admission. The following factors support the patient status of observation.   " The patient's presenting symptoms include left facial numbness and tingling. " The physical exam findings include left facial numbness and tingling. " The initial radiographic and laboratory data are MR brain with right thalamic acute infarct and extensive old small vessel infarcts.  Creatinine stable at 1.44, hemoglobin stable 11.3, protein stable at 8.3.  UA with leuks, bacteria, protein.   Marcelyn Bruins MD [facility]  How to contact the [facility] Attending or Consulting provider [place] or covering provider during after hours [place], for this patient?   1. Check the care team in [facility] and look for a) attending/consulting [facility] provider listed and b) the [facility] team listed 2. Log into [email or web address] and use [place]'s universal password to access. If you do not have the password, please contact the hospital operator. 3. Locate the [facility] provider you are looking for under [facility] and page to a number that you can be directly reached. 4. If you still have difficulty reaching the provider, please page the [facility] (Director on Call) for the Hospitalists listed on amion for assistance.  11/14/2020, 9:43 PM

## 2020-11-14 NOTE — ED Provider Notes (Signed)
[facility] Provider Note   CSN: [id number] Arrival date & time: 11/14/20  1134     History No chief complaint on file.   Paula Haynes is a 85 y.o. female.  HPI She reports onset of tingling around the left side of her mouth, yesterday morning at 9 AM.  The symptoms are persisting and improving slightly.  She denies headache.  She took her usual medications this morning.  She ate about an hour ago.  She said the food tasted normal.  She went to her doctor's office, without an appointment and staff there called EMS to transport her here for evaluation.  She denies chest pain, shortness of breath, difficulty walking, or other acute neurologic symptoms.  No similar problem in the past.  She reports she has been "eating a lot lately."  There are no other known modifying factors.    Past Medical History:  Diagnosis Date  . Arthritis   . History of blood product transfusion   . Hyperproteinemia 02/28/2019  . Hypertension   . Thyroid disease     Patient Active Problem List   Diagnosis Date Noted  . Vitamin D deficiency 09/26/2020  . Postoperative hypothyroidism 09/26/2020  . Hyperkalemia 09/26/2020  . Lumbar pain 08/01/2020  . Elevated immunoglobulin A & G 03/29/2019  . Overweight (BMI 25.0-29.9) 03/11/2019  . Thrombocytopenia ([facility]) 07/26/2018  . Lymphocytopenia 07/26/2018  . Anemia, chronic disease 07/23/2018  . Osteoporosis 11/16/2017  . CKD (chronic kidney disease) stage 3, GFR 30-59 ml/min (HCC) 03/19/2016  . Varicose veins of lower extremities with other complications 65/78/4696  . Hypothyroidism 10/09/2008  . Essential hypertension 10/09/2008    Past Surgical History:  Procedure Laterality Date  . FINGER SURGERY     right middle finger/due to glass window cutting finger  . REFRACTIVE SURGERY     bil     OB History    Gravida  3   Para  3   Term  3   Preterm      AB      Living  3     SAB      IAB       Ectopic      Multiple      Live Births              Family History  Problem Relation Age of Onset  . Heart disease Father   . Arthritis Other        parent  . Hypertension Other        parent  . Stroke Other        parent    Social History   Tobacco Use  . Smoking status: Never Smoker  . Smokeless tobacco: Never Used  Vaping Use  . Vaping Use: Never used  Substance Use Topics  . Alcohol use: No    Alcohol/week: 0.0 standard drinks  . Drug use: No    Home Medications Prior to Admission medications   Medication Sig Start Date End Date Taking? Authorizing Provider  amLODipine (NORVASC) 10 MG tablet TAKE 1 TABLET(10 MG) BY MOUTH DAILY Patient taking differently: Take 10 mg by mouth in the morning. 09/26/20  Yes Kuneff, Renee A, DO  aspirin 81 MG tablet Take 81 mg by mouth at bedtime.   Yes [provider]  Cholecalciferol (VITAMIN D3) 5000 units CAPS Take 5,000 Units by mouth at bedtime.   Yes [provider]  denosumab (PROLIA) 60 MG/ML SOSY injection  Inject into the skin every 6 (six) months. 11/06/17  Yes [provider]  lisinopril (ZESTRIL) 30 MG tablet TAKE 1 TABLET(30 MG) BY MOUTH DAILY Patient taking differently: Take 30 mg by mouth daily at 4 PM. 09/26/20  Yes Kuneff, Renee A, DO  Naphazoline-Pheniramine (OPCON-A) 0.027-0.315 % SOLN Place 1-2 drops into both eyes 4 (four) times daily as needed (for seasonal allergies).   Yes [provider]  NON FORMULARY Take 1 capsule by mouth See admin instructions. Omega-3 capsule: Take 1 capsule by mouth at bedtime   Yes [provider]  sodium bicarbonate 650 MG tablet Take 1,300 mg by mouth 2 (two) times daily. 07/27/20  Yes [provider]  SYNTHROID 100 MCG tablet Take 100 mcg by mouth daily before breakfast. 08/28/20  Yes [provider]  Blood Pressure Monitoring (BLOOD PRESSURE KIT) DEVI 1 Device by Does not apply route daily. Large cuff 01/08/18   Kuneff, Renee  A, DO    Allergies    Patient has no known allergies.  Review of Systems   Review of Systems  All other systems reviewed and are negative.   Physical Exam Updated Vital Signs BP (!) 165/88   Pulse 84   Temp 97.7 F (36.5 C) (Oral)   Resp 17   Ht '5\' 2"'  (1.575 m)   Wt 59.9 kg   SpO2 99%   BMI 24.14 kg/m   Physical Exam Vitals and nursing note reviewed.  Constitutional:      General: She is not in acute distress.    Appearance: She is well-developed. She is not ill-appearing, toxic-appearing or diaphoretic.  HENT:     Head: Normocephalic and atraumatic.     Right Ear: External ear normal.     Left Ear: External ear normal.  Eyes:     Conjunctiva/sclera: Conjunctivae normal.     Pupils: Pupils are equal, round, and reactive to light.  Neck:     Trachea: Phonation normal.  Cardiovascular:     Rate and Rhythm: Normal rate and regular rhythm.     Heart sounds: Normal heart sounds.  Pulmonary:     Effort: Pulmonary effort is normal.     Breath sounds: Normal breath sounds.  Abdominal:     Palpations: Abdomen is soft.     Tenderness: There is no abdominal tenderness.  Musculoskeletal:        General: Normal range of motion.     Cervical back: Normal range of motion and neck supple.  Skin:    General: Skin is warm and dry.  Neurological:     Mental Status: She is alert and oriented to person, place, and time.     Cranial Nerves: No cranial nerve deficit.     Motor: No abnormal muscle tone.     Coordination: Coordination normal.     Comments: No dysarthria, aphasia or nystagmus.  Very mild diminished light touch sensation, left upper and lower skin around lips.  This does not generalized to the other parts of the face.  No facial asymmetry.  Normal eyelid strength.  Psychiatric:        Mood and Affect: Mood normal.        Behavior: Behavior normal.        Thought Content: Thought content normal.        Judgment: Judgment normal.     ED Results / Procedures /  Treatments   Labs (all labs ordered are listed, but only abnormal results are displayed) Labs Reviewed  COMPREHENSIVE METABOLIC PANEL - Abnormal; Notable for the following components:      Result Value   CO2 20 (*)    Glucose, Bld 100 (*)    BUN 37 (*)    Creatinine, Ser 1.44 (*)    Total Protein 8.3 (*)    Alkaline Phosphatase 36 (*)    GFR, Estimated 36 (*)    All other components within normal limits  URINALYSIS, ROUTINE W REFLEX MICROSCOPIC - Abnormal; Notable for the following components:   APPearance HAZY (*)    Protein, ur 30 (*)    Leukocytes,Ua TRACE (*)    Bacteria, UA MANY (*)    All other components within normal limits  CBC WITH DIFFERENTIAL/PLATELET - Abnormal; Notable for the following components:   RBC 3.76 (*)    Hemoglobin 11.3 (*)    HCT 34.7 (*)    All other components within normal limits  I-STAT CHEM 8, ED - Abnormal; Notable for the following components:   BUN 45 (*)    Creatinine, Ser 1.30 (*)    Glucose, Bld 101 (*)    Calcium, Ion 1.08 (*)    All other components within normal limits  RESP PANEL BY RT-PCR (FLU A&B, COVID) ARPGX2  ETHANOL  RAPID URINE DRUG SCREEN, HOSP PERFORMED  PROTIME-INR  APTT    EKG EKG Interpretation  Date/Time:  Wednesday November 14 2020 12:28:24 EDT Ventricular Rate:  96 PR Interval:  165 QRS Duration: 87 QT Interval:  351 QTC Calculation: 444 R Axis:   -3 Text Interpretation: Sinus tachycardia Atrial premature complexes Probable left ventricular hypertrophy Since last tracing rate faster (RBBB and left anterior fascicular block) , and Premature atrial complexes are new Otherwise no significant change Confirmed by Daleen Bo [phone]) on 11/14/2020 2:23:21 PM   Radiology CT HEAD WO CONTRAST  Result Date: 11/14/2020 CLINICAL DATA:  Numbness to left side of mouth. EXAM: CT HEAD WITHOUT CONTRAST TECHNIQUE: Contiguous axial images were obtained from the base of the skull through the vertex without intravenous contrast.  COMPARISON:  None. FINDINGS: Brain: There is no evidence for acute hemorrhage, hydrocephalus, mass lesion, or abnormal extra-axial fluid collection. No definite CT evidence for acute infarction. Diffuse loss of parenchymal volume is consistent with atrophy. Patchy low attenuation in the deep hemispheric and periventricular white matter is nonspecific, but likely reflects chronic microvascular ischemic demyelination. Age indeterminate lacunar infarction noted in the basal ganglia bilaterally. Vascular: No hyperdense vessel or unexpected calcification. Skull: No evidence for fracture. No worrisome lytic or sclerotic lesion. Sinuses/Orbits: The visualized paranasal sinuses and mastoid air cells are clear. Visualized portions of the globes and intraorbital fat are unremarkable. Other: None. IMPRESSION: 1. No acute intracranial abnormality. 2. Atrophy with chronic small vessel white matter ischemic disease. 3. Age indeterminate lacunar infarctions in the basal ganglia bilaterally. Electronically Signed   By: Misty Stanley M.D.   On: 11/14/2020 13:24    Procedures .Critical Care Performed by: Daleen Bo, MD Authorized by: Daleen Bo, MD   Critical care provider statement:    Critical care time (minutes):  40   Critical care start time:  11/14/2020 11:50 AM   Critical care end time:  11/14/2020 4:05 PM   Critical care time was exclusive of:  Separately billable procedures and treating other patients   Critical care was necessary to treat or prevent imminent or life-threatening deterioration of the following conditions:  CNS failure or compromise   Critical care was time spent personally by me on the following activities:  Blood draw for specimens, development of treatment plan with patient or surrogate, discussions with consultants, evaluation of patient's response to treatment, examination of patient, obtaining history from patient or surrogate, ordering and performing treatments and interventions,  ordering and review of laboratory studies, pulse oximetry, re-evaluation of patient's condition, review of old charts and ordering and review of radiographic studies     Medications Ordered in ED Medications - No data to display  ED Course  I have reviewed the triage vital signs and the nursing notes.  Pertinent labs & imaging results that were available during my care of the patient were reviewed by me and considered in my medical decision making (see chart for details).  Clinical Course as of 11/14/20 1607  Wed Nov 14, 2020  1457 Case discussed with on-call neuro hospitalist, Dr. Curly Shores.  We agreed on plan at this time to do MRI imaging of the brain, and discharge if no acute stroke. [EW]    Clinical Course User Index [EW] Daleen Bo, MD   MDM Rules/Calculators/A&P                           Patient Vitals for the past 24 hrs:  BP Temp Temp src Pulse Resp SpO2 Height Weight  11/14/20 1500 (!) 165/88 -- -- 84 17 99 % -- --  11/14/20 1447 (!) 153/82 -- -- 78 18 97 % -- --  11/14/20 1245 (!) 166/90 -- -- 90 20 100 % -- --  11/14/20 1145 -- -- -- -- -- -- '5\' 2"'  (1.575 m) 59.9 kg  11/14/20 1141 (!) 181/81 97.7 F (36.5 C) Oral (!) 103 18 100 % -- --    4:03 PM Reevaluation with update and discussion. After initial assessment and treatment, an updated evaluation reveals she remained stable has no further complaints, awaiting MRI imaging. Daleen Bo   Medical Decision Making:  This patient is presenting for evaluation of tingling face, which does require a range of treatment options, and is a complaint that involves a high risk of morbidity and mortality. The differential diagnoses include hypertensive urgency, CVA, facial nerve disorder. I decided to review old records, and in summary elderly female presenting with persistent tingling left face, around lips, without other evident acute neurologic abnormalities.  She presents with mild hypertension. I did not require  additional historical information from anyone.  Clinical Laboratory Tests Ordered, included CBC, Metabolic panel and Viral panel, alcohol level, coagulation profile. Review indicates normal except mild renal insufficiency and azotemia. Radiologic Tests Ordered, included CT head, MRI brain.  I independently Visualized: CT images, which show no intracranial bleeding.  MRI pending.    Critical Interventions-medical evaluation, laboratory testing, CT imaging, observation, discussion with neuro hospitalist, MRI imaging  After These Interventions, the Patient was reevaluated and was found to require advanced imaging for numbness, above and below the left lips.  This is an unusual distribution, and could represent mental nerve impingement, versus possible CVA.  Initial head CT ordered to rule out intracranial bleeding associated with hypertension.  This was negative and blood pressure spontaneously improved.  Patient can likely be discharged based on MRI results.  CRITICAL CARE-yes Performed by: Daleen Bo  Nursing Notes Reviewed/ Care Coordinated Applicable Imaging Reviewed Interpretation of Laboratory Data incorporated into ED treatment   Plan-disposition by Dr. Sherry Ruffing, following MRI imaging.    Final Clinical Impression(s) / ED Diagnoses Final diagnoses:  Facial numbness    Rx / DC Orders ED Discharge  Orders    None       Daleen Bo, MD 11/14/20 [id number]

## 2020-11-14 NOTE — Consult Note (Signed)
NEURO HOSPITALIST CONSULT NOTE   Requestig physician: Dr. Sherry Ruffing  Reason for Consult: Acute lacunar stroke involving the right lateral thalamus  History obtained from:  Patient and Chart     HPI:                                                                                                                                          Paula Haynes is an 85 y.o. female with a PMHx of HTN and thyroid disease who presented to the ED on Wednesday morning for evaluation of stroke-like symptoms. At 11:05 AM, she walked into her 60 office stating that she had some left side numbness on her face and was concerned about it. When patient was talking with RN some drooping on the left side of the patient's face was noted, with no movement in the area of the left side of mouth and cheek area. She said it felt like "novocaine". EMS was called and the patient was transported to the [facility] ED. BP per EMS was 200/116 and HR was 130. She has had no headache and has been compliant with her medications, which include daily ASA.     BUN 45, Cr 1.3, eGFR 36.  WBC normal Transaminases normal.  Ca normal Na normal  EKG: Sinus tachycardia Atrial premature complexes Probable left ventricular hypertrophy  MRI brain:  1. Punctate acute infarction at the right thalamus. 2. Extensive old small vessel ischemic changes throughout the brain. This includes numerous old bilateral thalamic small-vessel infarctions. Many of the old small vessel insults are associated with hemosiderin deposition, but there is no evidence of acute hemorrhage by either today's CT or this MRI.   Past Medical History:  Diagnosis Date  . Arthritis   . History of blood product transfusion   . Hyperproteinemia 02/28/2019  . Hypertension   . Thyroid disease     Past Surgical History:  Procedure Laterality Date  . FINGER SURGERY     right middle finger/due to glass window cutting finger  . REFRACTIVE SURGERY      bil    Family History  Problem Relation Age of Onset  . Heart disease Father   . Arthritis Other        parent  . Hypertension Other        parent  . Stroke Other        parent              Social History:  reports that she has never smoked. She has never used smokeless tobacco. She reports that she does not drink alcohol and does not use drugs.  No Known Allergies  MEDICATIONS:  Prior to Admission:  Medications Prior to Admission  Medication Sig Dispense Refill Last Dose  . amLODipine (NORVASC) 10 MG tablet TAKE 1 TABLET(10 MG) BY MOUTH DAILY (Patient taking differently: Take 10 mg by mouth in the morning.) 90 tablet 1 11/14/2020 at am  . aspirin 81 MG tablet Take 81 mg by mouth at bedtime.   11/13/2020 at 2300  . Cholecalciferol (VITAMIN D3) 5000 units CAPS Take 5,000 Units by mouth at bedtime.   11/14/2020 at Unknown time  . denosumab (PROLIA) 60 MG/ML SOSY injection Inject into the skin every 6 (six) months.   05/2020 at 05/2020  . lisinopril (ZESTRIL) 30 MG tablet TAKE 1 TABLET(30 MG) BY MOUTH DAILY (Patient taking differently: Take 30 mg by mouth daily at 4 PM.) 90 tablet 1 11/13/2020 at 1600  . Naphazoline-Pheniramine (OPCON-A) 0.027-0.315 % SOLN Place 1-2 drops into both eyes 4 (four) times daily as needed (for seasonal allergies).   11/13/2020 at Unknown time  . NON FORMULARY Take 1 capsule by mouth See admin instructions. Omega-3 capsule: Take 1 capsule by mouth at bedtime   11/13/2020 at pm  . sodium bicarbonate 650 MG tablet Take 1,300 mg by mouth 2 (two) times daily.   11/13/2020 at Unknown time  . SYNTHROID 100 MCG tablet Take 100 mcg by mouth daily before breakfast.   11/14/2020 at am  . Blood Pressure Monitoring (BLOOD PRESSURE KIT) DEVI 1 Device by Does not apply route daily. Large cuff 1 Device 0    Scheduled: .  stroke: mapping our early stages of recovery  book   Does not apply Once  . aspirin  81 mg Oral QHS  . atorvastatin  40 mg Oral Daily  . enoxaparin (LOVENOX) injection  30 mg Subcutaneous Q24H  . levothyroxine  100 mcg Oral Q0600  . sodium bicarbonate  1,300 mg Oral BID     ROS:                                                                                                                                       No SOB or CP. No gait abnormality. Does not endorse headache. Has chronic back pain. Denies any limb weakness or numbness. Still has some sensory numbness at the corner of her mouth on the left. Other ROS as per HPI.   Blood pressure (!) 170/91, pulse 76, temperature 97.7 F (36.5 C), temperature source Oral, resp. rate 18, height 5' 2" (1.575 m), weight 59.9 kg, SpO2 100 %.   General Examination:  Physical Exam  HEENT-  [place]/AT    Lungs- Respirations unlabored Extremities- Warm and well perfused  Neurological Examination Mental Status: Awake and alert. Attention is normal. Pleasant and cooperative. Fully oriented. Speech fluent without evidence of aphasia.  Able to follow all commands without difficulty. Cranial Nerves: II:  Temporal visual fields intact with no extinction to DSS. PERRL.   III,IV, VI: No ptosis. EOMI. No nystagmus.  V,VII: Temperature sensation equal bilaterally. No facial droop. No evidence for weakness of perioral musculature.  VIII: Hearing intact to voice IX,X: No hypophonia XI: Symmetric XII: Midline tongue extension Motor: Right : Upper extremity   5/5    Left:     Upper extremity   5/5  Lower extremity   5/5     Lower extremity   5/5 No pronator drift.  Sensory: Temp and light touch intact throughout, bilaterally. No extinction to DSS.  Deep Tendon Reflexes: 2+ and symmetric throughout Cerebellar: No ataxia with FNF bilaterally Gait: Deferred   Lab Results: Basic Metabolic Panel: Recent  Labs  Lab 11/14/20 1322 11/14/20 1355  NA 136 138  K 4.1 4.2  CL 104 106  CO2 20*  --   GLUCOSE 100* 101*  BUN 37* 45*  CREATININE 1.44* 1.30*  CALCIUM 9.3  --     CBC: Recent Labs  Lab 11/14/20 1351 11/14/20 1355  WBC 8.5  --   NEUTROABS 6.6  --   HGB 11.3* 12.9  HCT 34.7* 38.0  MCV 92.3  --   PLT 309  --     Cardiac Enzymes: No results for input(s): CKTOTAL, CKMB, CKMBINDEX, TROPONINI in the last 168 hours.  Lipid Panel: No results for input(s): CHOL, TRIG, HDL, CHOLHDL, VLDL, LDLCALC in the last 168 hours.  Imaging: CT HEAD WO CONTRAST  Result Date: 11/14/2020 CLINICAL DATA:  Numbness to left side of mouth. EXAM: CT HEAD WITHOUT CONTRAST TECHNIQUE: Contiguous axial images were obtained from the base of the skull through the vertex without intravenous contrast. COMPARISON:  None. FINDINGS: Brain: There is no evidence for acute hemorrhage, hydrocephalus, mass lesion, or abnormal extra-axial fluid collection. No definite CT evidence for acute infarction. Diffuse loss of parenchymal volume is consistent with atrophy. Patchy low attenuation in the deep hemispheric and periventricular white matter is nonspecific, but likely reflects chronic microvascular ischemic demyelination. Age indeterminate lacunar infarction noted in the basal ganglia bilaterally. Vascular: No hyperdense vessel or unexpected calcification. Skull: No evidence for fracture. No worrisome lytic or sclerotic lesion. Sinuses/Orbits: The visualized paranasal sinuses and mastoid air cells are clear. Visualized portions of the globes and intraorbital fat are unremarkable. Other: None. IMPRESSION: 1. No acute intracranial abnormality. 2. Atrophy with chronic small vessel white matter ischemic disease. 3. Age indeterminate lacunar infarctions in the basal ganglia bilaterally. Electronically Signed   By: Misty Stanley M.D.   On: 11/14/2020 13:24   MR BRAIN WO CONTRAST  Result Date: 11/14/2020 CLINICAL DATA:  Left  facial numbness.  Acute stroke suspected. EXAM: MRI HEAD WITHOUT CONTRAST TECHNIQUE: Multiplanar, multiecho pulse sequences of the brain and surrounding structures were obtained without intravenous contrast. COMPARISON:  Head CT earlier same day. FINDINGS: Brain: Diffusion imaging shows a punctate acute infarction at the right lateral thalamus. No other acute finding. Old small vessel ischemic changes affect the pons. Few old small vessel cerebellar infarctions. Numerous old small vessel infarctions affect the thalami, basal ganglia and hemispheric deep white matter. There is punctate hemosiderin deposition associated with many of the old small vessel insults.  No large vessel territory stroke. No mass, acute hemorrhage, hydrocephalus or extra-axial collection. Vascular: Major vessels at the base of the brain show flow. Skull and upper cervical spine: Negative Sinuses/Orbits: Clear/normal Other: None IMPRESSION: 1. Punctate acute infarction at the right lateral thalamus. 2. Extensive old small vessel ischemic changes throughout the brain as outlined above. This includes numerous old bilateral thalamic small-vessel infarctions. Many of the old small vessel insults are associated with hemosiderin deposition, but I do not see evidence of acute hemorrhage by either today's CT or this MRI. Electronically Signed   By: Nelson Chimes M.D.   On: 11/14/2020 18:38    Assessment: 85 year old female presenting with acute lacunar infarction of the right lateral thalamus 1. Exam reveals no facial droop, focal motor or sensory deficit.  2. MRI brain: Punctate acute infarction at the right thalamus. Extensive old small vessel ischemic changes throughout the brain, including numerous old bilateral thalamic small-vessel infarctions. Many of the old small vessel insults are associated with hemosiderin deposition, but there is no evidence of acute hemorrhage by either today's CT or this MRI.  3. Stroke risk factors: HTN 4.  Classifiable as having failed ASA monotherapy.  5. Impaired renal function with eGFR of 36  Recommendations: 1. HgbA1c, fasting lipid panel 2. MRA of head 3. Carotid ultrasound 4. TTE  5. Cardiac telemetry 6. PT consult, OT consult, Speech consult 7. Has been started on atorvastatin. Obtain baseline CK level.  8. Add Plavix to ASA.  9. Risk factor modification 10. Frequent neuro checks 11. NPO until passes stroke swallow screen 12. Modified permissive HTN protocol given advanced age. Treat if SBP > 180.   Electronically signed: Dr. Kerney Elbe 11/14/2020, 7:40 PM

## 2020-11-14 NOTE — ED Triage Notes (Signed)
Patient presents to the ED via [facility] from a doctors office for numbness to the left side of the mouth.  Patient states that it is her lips and roof of her mouth. EMS states no other neurological deficits noted.    BP: 200/116 HR: 130  Patient does take amlodipine and lisinopril

## 2020-11-14 NOTE — Telephone Encounter (Signed)
Patient walked into office requesting to see Dr. Raoul Pitch. I told patient Dr. Genia Hotter was out of the office this week, and is there something that I could help her with.  Patient stated that she had some left side numbness on her face, and was concerned about it.  When patient was talking with me, I noticed some drooping on the left side, and there was no movement in the area of left side mouth and cheek area.  She said it felt like "novocaine". I told her to sit down in chair in lobby and I would be right back.  I spoke to [place], clinical assistant for Dr. Anitra Lauth, and asked her to please come to front office, and call EMS.

## 2020-11-14 NOTE — ED Provider Notes (Signed)
4:46 PM Care assumed from Dr. Eulis Foster.  At time of transfer care, patient is awaiting MRI of the brain to look for stroke as etiology of left-sided facial numbness.  Plan of care is to reassess and determine disposition after MRI is completed.   MRI returned showing evidence of acute stroke causing her symptoms.  Neurology was called who recommends admission for further stroke work-up.   Clinical Impression: 1. Facial numbness   2. Cerebrovascular accident (CVA), unspecified mechanism ([place])     Disposition: Admit  This note was prepared with assistance of Dragon voice recognition software. Occasional wrong-word or sound-a-like substitutions may have occurred due to the inherent limitations of voice recognition software.      Riham Polyakov, Gwenyth Allegra, MD 11/15/20 [phone]

## 2020-11-15 ENCOUNTER — Other Ambulatory Visit: Payer: Self-pay | Admitting: Physician Assistant

## 2020-11-15 ENCOUNTER — Observation Stay (HOSPITAL_COMMUNITY): Payer: PPO

## 2020-11-15 ENCOUNTER — Observation Stay (HOSPITAL_BASED_OUTPATIENT_CLINIC_OR_DEPARTMENT_OTHER): Payer: PPO

## 2020-11-15 ENCOUNTER — Encounter (HOSPITAL_COMMUNITY): Payer: PPO

## 2020-11-15 DIAGNOSIS — I10 Essential (primary) hypertension: Secondary | ICD-10-CM | POA: Diagnosis not present

## 2020-11-15 DIAGNOSIS — I771 Stricture of artery: Secondary | ICD-10-CM | POA: Diagnosis not present

## 2020-11-15 DIAGNOSIS — I639 Cerebral infarction, unspecified: Secondary | ICD-10-CM

## 2020-11-15 DIAGNOSIS — I672 Cerebral atherosclerosis: Secondary | ICD-10-CM | POA: Diagnosis not present

## 2020-11-15 DIAGNOSIS — I6389 Other cerebral infarction: Secondary | ICD-10-CM

## 2020-11-15 DIAGNOSIS — I63233 Cerebral infarction due to unspecified occlusion or stenosis of bilateral carotid arteries: Secondary | ICD-10-CM | POA: Diagnosis not present

## 2020-11-15 LAB — LIPID PANEL
Cholesterol: 177 mg/dL (ref 0–200)
HDL: 64 mg/dL (ref >40)
LDL Cholesterol: 103 mg/dL — ABNORMAL HIGH (ref 0–99)
Total CHOL/HDL Ratio: 2.8 RATIO
Triglycerides: 51 mg/dL (ref <150)
VLDL: 10 mg/dL (ref 0–40)

## 2020-11-15 LAB — ECHOCARDIOGRAM COMPLETE
AR max vel: 1.8 cm2
AV Area VTI: 1.83 cm2
AV Area mean vel: 1.69 cm2
AV Mean grad: 7 mmHg
AV Peak grad: 12 mmHg
Ao pk vel: 1.73 m/s
Area-P 1/2: 2.54 cm2
Calc EF: 57.3 %
Height: 62 in
S' Lateral: 2.7 cm
Single Plane A2C EF: 58.4 %
Single Plane A4C EF: 59.7 %
Weight: 2091.72 oz

## 2020-11-15 LAB — CK: Total CK: 64 U/L (ref 38–234)

## 2020-11-15 LAB — HEMOGLOBIN A1C
Hgb A1c MFr Bld: 6.1 % — ABNORMAL HIGH (ref 4.8–5.6)
Mean Plasma Glucose: 128.37 mg/dL

## 2020-11-15 MED ORDER — IOHEXOL 350 MG/ML SOLN
60.0000 mL | Freq: Once | INTRAVENOUS | Status: AC | PRN
  Administered 2020-11-15: 60 mL via INTRAVENOUS

## 2020-11-15 MED ORDER — ASPIRIN 81 MG PO TABS: 81.0000 mg | ORAL_TABLET | Freq: Every day | ORAL | Status: DC

## 2020-11-15 MED ORDER — ATORVASTATIN CALCIUM 40 MG PO TABS: 40.0000 mg | ORAL_TABLET | Freq: Every day | ORAL | 0 refills | Status: DC

## 2020-11-15 MED ORDER — CLOPIDOGREL BISULFATE 75 MG PO TABS: 75.0000 mg | ORAL_TABLET | Freq: Every day | ORAL | 0 refills | Status: DC

## 2020-11-15 MED ORDER — CLOPIDOGREL BISULFATE 75 MG PO TABS
75.0000 mg | ORAL_TABLET | Freq: Every day | ORAL | Status: DC
  Administered 2020-11-15: 75 mg via ORAL
  Filled 2020-11-15: qty 1

## 2020-11-15 NOTE — Progress Notes (Signed)
Patient arrived in the unit in a wheel chair from [place], A&Ox4, no s/s distress, denied of any uncomfortable, resting in a bed, initiated telemetry monitor, and will continue to monitor closely.

## 2020-11-15 NOTE — Discharge Summary (Signed)
Discharge Summary  Paula Haynes EHO:[id number] DOB: [birth year]  PCP: Ma Hillock, DO  Admit date: 11/14/2020 Discharge date: 11/15/2020  Time spent:  47mns  Recommendations for Outpatient Follow-up:  1. F/u with PCP within a week  for hospital discharge follow up, repeat cbc/bmp at follow up 2. F/u with neurology, referral made 3. 30day cardiac monitor to rule out afib, staff message sent to cards  Plan asa/plavix 3weeks then plavix alone Start lipitor    Discharge Diagnoses:  Active Hospital Problems   Diagnosis Date Noted  . Acute CVA (cerebrovascular accident) ([facility] 11/14/2020  . Anemia, chronic disease 07/23/2018  . CKD (chronic kidney disease) stage 3, GFR 30-59 ml/min (HCC) 03/19/2016  . Hypothyroidism 10/09/2008  . Essential hypertension 10/09/2008    Resolved Hospital Problems  No resolved problems to display.    Discharge Condition: stable  Diet recommendation: heart healthy  Filed Weights   11/14/20 1145 11/15/20 0323  Weight: 59.9 kg 59.3 kg    History of present illness: (Per admitting MD Dr. MTrilby Drummer Patient coming from: Home  Chief Complaint: Facial numbness  HPI: Paula TOELLEis a 85y.o. female with medical history significant of anemia, CKD, hypertension, hypothyroidism, elevated protein who presents with left facial numbness.             Patient had been experiencing some left facial numbness since around 9 AM yesterday.  He had been persistent with some mild improvement over the past day. She describes the numbness as a numbness and tingling sensation of her left lips and roof of her mouth.  She denies any other numbness nor weakness nor changes that she has noticed.             Due to the persistent of her symptoms she went by her PCPs office today as a walk-in and after stating her problem and initial evaluation EMS was called to transport patient to the ED.             She denies fevers, chills, chest pain, shortness of breath,  abdominal pain, constipation, diarrhea, nausea, vomiting.  ED Course: Vital signs in the ED significant for initial tachycardia now improved to the 70s to 80s.  Blood pressure in the 1370Wto 1888Bsystolic.  Lab work-up showed CMP with bicarb 20, BUN stable at 37, creatinine stable at 1.44, protein stably elevated at 8.3.  CBC showed hemoglobin stable at 11.3.  PTT, PT, INR within normal limits.  Respiratory panel for flu and COVID is negative.  Urinalysis showed leukocytes, bacteria, protein.  UDS negative and ethanol level negative.  CT head showed no acute abnormality did shows a determinate lacunar infarcts.  MR brain showed acute right thalamus infarct and extensive old small vessel infarcts.  Neurology was consulted in the ED recommend admission for stroke work-up.  Hospital Course:  Principal Problem:   Acute CVA (cerebrovascular accident) ([facility] Active Problems:   Hypothyroidism   Essential hypertension   CKD (chronic kidney disease) stage 3, GFR 30-59 ml/min (HCC)   Anemia, chronic disease  acute lacunar infarction of the right lateral thalamus, likely due to small vessel disease -presents with left facial numbness  -MRI brain: Punctate acute infarction at the right thalamus. Extensive old small vessel ischemic changes throughout the brain, including numerous old bilateral thalamic small-vessel infarctions. Many of the old small vessel insults are associated with hemosiderin deposition, but there is no evidence of acute hemorrhage  -CT angio head and neck no significant large vessel disease -  Echocardiogram no cardiac emboli -She did have tachycardia on presentation with frequent PACs, refer outpatient 30-day cardiac monitor to rule out A. fib per neurology recommendation -LDL 103, goal of LDL less than 70, previously not on statin, Lipitor started at 40 mg daily -A1c 6.1% -She was on aspirin 81 mg daily prior to admission, neurology recommended aspirin plus Plavix for 21 days, then  Plavix alone moving forward -Allowed permissive hypertension in the hospital -She did well with PT/OT, no home health or outpatient therapy recommended -Neurology cleared her to go home with outpatient neurology follow-up  Hypertension -Home blood pressure medication held in hospital  for permissive hypertension in acute CVA -Resume home blood pressure medication at discharge -Follow-up with PCP  CKD 3B with metabolic acidosis/anemia of chronic disease -Creatinine/hemoglobin at baseline -Renal dosing medication, continue home meds sodium bicarb supplement  Asymptomatic bacteriuria -No indication for antibiotics ,f/u with pcp  Hypothyroidism, postsurgical Continue Synthroid   Procedures:  None  Consultations:  Neurology  Discharge Exam: BP (!) 184/86 (BP Location: Left Arm)   Pulse 77   Temp 98.8 F (37.1 C) (Oral)   Resp 18   Ht '5\' 2"'  (1.575 m)   Wt 59.3 kg   SpO2 94%   BMI 23.91 kg/m   General: NAD, aaox3 Cardiovascular: RRR Respiratory: Normal respiratory effort  Discharge Instructions You were cared for by a hospitalist during your hospital stay. If you have any questions about your discharge medications or the care you received while you were in the hospital after you are discharged, you can call the unit and asked to speak with the hospitalist on call if the hospitalist that took care of you is not available. Once you are discharged, your primary care physician will handle any further medical issues. Please note that NO REFILLS for any discharge medications will be authorized once you are discharged, as it is imperative that you return to your primary care physician (or establish a relationship with a primary care physician if you do not have one) for your aftercare needs so that they can reassess your need for medications and monitor your lab values.  Discharge Instructions    Ambulatory referral to Neurology   Complete by: As directed    An appointment is  requested in approximately: 4 weeks For stroke follow up   Diet - low sodium heart healthy   Complete by: As directed    Increase activity slowly   Complete by: As directed      Allergies as of 11/15/2020   No Known Allergies     Medication List    TAKE these medications   amLODipine 10 MG tablet Commonly known as: NORVASC TAKE 1 TABLET(10 MG) BY MOUTH DAILY What changed:   how much to take  how to take this  when to take this  additional instructions   aspirin 81 MG tablet Take 1 tablet (81 mg total) by mouth at bedtime. Stop taking after 21days What changed: additional instructions   atorvastatin 40 MG tablet Commonly known as: LIPITOR Take 1 tablet (40 mg total) by mouth daily. Start taking on: November 16, 2020   Blood Pressure Kit Devi 1 Device by Does not apply route daily. Large cuff   clopidogrel 75 MG tablet Commonly known as: PLAVIX Take 1 tablet (75 mg total) by mouth daily. Start taking on: November 16, 2020   lisinopril 30 MG tablet Commonly known as: ZESTRIL TAKE 1 TABLET(30 MG) BY MOUTH DAILY What changed:   how much  to take  how to take this  when to take this  additional instructions   NON FORMULARY Take 1 capsule by mouth See admin instructions. Omega-3 capsule: Take 1 capsule by mouth at bedtime   Opcon-A 0.027-0.315 % Soln Generic drug: Naphazoline-Pheniramine Place 1-2 drops into both eyes 4 (four) times daily as needed (for seasonal allergies).   Prolia 60 MG/ML Sosy injection Generic drug: denosumab Inject into the skin every 6 (six) months.   sodium bicarbonate 650 MG tablet Take 1,300 mg by mouth 2 (two) times daily.   Synthroid 100 MCG tablet Generic drug: levothyroxine Take 100 mcg by mouth daily before breakfast.   Vitamin D3 125 MCG (5000 UT) Caps Take 5,000 Units by mouth at bedtime.      No Known Allergies  Follow-up Information    Kuneff, Renee A, DO. Call in 1 week(s).   Specialty: Family Medicine Why: for  hospital discharge follow up, pcp to monitor blood pressure,  30day plavix and lipitor prescribed at discharge, hospitalist do not give refill orders. please make sure to ask for refills before running out. Contact information: [address] [place] [phone]        [facility] Follow up in 4 week(s).   Why: referral made, please call if you do not hear from neurology in one week. Contact information: [address]     [place] [phone]               The results of significant diagnostics from this hospitalization (including imaging, microbiology, ancillary and laboratory) are listed below for reference.    Significant Diagnostic Studies: CT ANGIO HEAD NECK W WO CM  Result Date: 11/15/2020 CLINICAL DATA:  Follow-up acute infarction right lateral thalamus. EXAM: CT ANGIOGRAPHY HEAD AND NECK TECHNIQUE: Multidetector CT imaging of the head and neck was performed using the standard protocol during bolus administration of intravenous contrast. Multiplanar CT image reconstructions and MIPs were obtained to evaluate the vascular anatomy. Carotid stenosis measurements (when applicable) are obtained utilizing NASCET criteria, using the distal internal carotid diameter as the denominator. CONTRAST:  61m OMNIPAQUE IOHEXOL 350 MG/ML SOLN COMPARISON:  MRI yesterday. FINDINGS: CT HEAD FINDINGS Brain: No acute finding visible by CT. Chronic small-vessel ischemic changes affect pons, thalami and hemispheric deep white matter. No large vessel territory infarction. No mass, hemorrhage, hydrocephalus or extra-axial collection. Vascular: There is atherosclerotic calcification of the major vessels at the base of the brain. Skull: Negative Sinuses: Clear Orbits: Normal Review of the MIP images confirms the above findings CTA NECK FINDINGS Aortic arch: Aortic atherosclerosis. Irregular plaque. Brachiocephalic branching pattern is  normal without origin stenosis. Right carotid system: Common carotid artery is tortuous but widely patent to the bifurcation. Calcified plaque at the ICA bulb but no stenosis. Cervical ICA widely patent to the skull base. Left carotid system: Common carotid artery is tortuous but widely patent to the bifurcation. Soft and calcified plaque in the ICA bulb but no stenosis. Cervical ICA widely patent. Vertebral arteries: Both vertebral artery origins are widely patent. Both vertebral arteries appear normal through the cervical region to the foramen magnum. Skeleton: Ordinary cervical spondylosis. Other neck: No mass or lymphadenopathy. Upper chest: Mild pleural and parenchymal scarring. No active process. Review of the MIP images confirms the above findings CTA HEAD FINDINGS Anterior circulation: Both internal carotid arteries are patent through the skull base and siphon regions. Ordinary siphon atherosclerotic calcification but without stenosis greater than 30%.  The anterior and middle cerebral vessels are patent without proximal stenosis, aneurysm or vascular malformation. No large or medium vessel occlusion. Posterior circulation: Both vertebral arteries are patent to the basilar. No basilar stenosis. Posterior circulation branch vessels are normal. Venous sinuses: Patent and normal. Anatomic variants: None significant. Review of the MIP images confirms the above findings IMPRESSION: 1. No intracranial large or medium vessel occlusion or correctable proximal stenosis. 2. Atherosclerotic disease at both carotid bifurcations but without stenosis. 3. Aortic atherosclerosis with irregular plaque. Aortic Atherosclerosis (ICD10-I70.0). Electronically Signed   By: Nelson Chimes M.D.   On: 11/15/2020 11:01   CT HEAD WO CONTRAST  Result Date: 11/14/2020 CLINICAL DATA:  Numbness to left side of mouth. EXAM: CT HEAD WITHOUT CONTRAST TECHNIQUE: Contiguous axial images were obtained from the base of the skull through the  vertex without intravenous contrast. COMPARISON:  None. FINDINGS: Brain: There is no evidence for acute hemorrhage, hydrocephalus, mass lesion, or abnormal extra-axial fluid collection. No definite CT evidence for acute infarction. Diffuse loss of parenchymal volume is consistent with atrophy. Patchy low attenuation in the deep hemispheric and periventricular white matter is nonspecific, but likely reflects chronic microvascular ischemic demyelination. Age indeterminate lacunar infarction noted in the basal ganglia bilaterally. Vascular: No hyperdense vessel or unexpected calcification. Skull: No evidence for fracture. No worrisome lytic or sclerotic lesion. Sinuses/Orbits: The visualized paranasal sinuses and mastoid air cells are clear. Visualized portions of the globes and intraorbital fat are unremarkable. Other: None. IMPRESSION: 1. No acute intracranial abnormality. 2. Atrophy with chronic small vessel white matter ischemic disease. 3. Age indeterminate lacunar infarctions in the basal ganglia bilaterally. Electronically Signed   By: Misty Stanley M.D.   On: 11/14/2020 13:24   MR BRAIN WO CONTRAST  Result Date: 11/14/2020 CLINICAL DATA:  Left facial numbness.  Acute stroke suspected. EXAM: MRI HEAD WITHOUT CONTRAST TECHNIQUE: Multiplanar, multiecho pulse sequences of the brain and surrounding structures were obtained without intravenous contrast. COMPARISON:  Head CT earlier same day. FINDINGS: Brain: Diffusion imaging shows a punctate acute infarction at the right lateral thalamus. No other acute finding. Old small vessel ischemic changes affect the pons. Few old small vessel cerebellar infarctions. Numerous old small vessel infarctions affect the thalami, basal ganglia and hemispheric deep white matter. There is punctate hemosiderin deposition associated with many of the old small vessel insults. No large vessel territory stroke. No mass, acute hemorrhage, hydrocephalus or extra-axial collection.  Vascular: Major vessels at the base of the brain show flow. Skull and upper cervical spine: Negative Sinuses/Orbits: Clear/normal Other: None IMPRESSION: 1. Punctate acute infarction at the right lateral thalamus. 2. Extensive old small vessel ischemic changes throughout the brain as outlined above. This includes numerous old bilateral thalamic small-vessel infarctions. Many of the old small vessel insults are associated with hemosiderin deposition, but I do not see evidence of acute hemorrhage by either today's CT or this MRI. Electronically Signed   By: Nelson Chimes M.D.   On: 11/14/2020 18:38   ECHOCARDIOGRAM COMPLETE  Result Date: 11/15/2020    ECHOCARDIOGRAM REPORT   Patient Name:   Paula Haynes Date of Exam: 11/15/2020 Medical Rec #:  [id number]       Height:       62.0 in Accession #:    [id number]      Weight:       130.7 lb Date of Birth:  Aug 25, 1935        BSA:          1.596  m Patient Age:    46 years        BP:           169/91 mmHg Patient Gender: F               HR:           81 bpm. Exam Location:  Inpatient Procedure: 2D Echo, Cardiac Doppler and Color Doppler Indications:    Stroke  History:        Patient has no prior history of Echocardiogram examinations.                 Risk Factors:Hypertension.  Sonographer:    Cammy Brochure Referring Phys: [id number] [place]  1. Left ventricular ejection fraction, by estimation, is 55 to 60%. The left ventricle has normal function. The left ventricle has no regional wall motion abnormalities. There is moderate asymmetric left ventricular hypertrophy of the basal and septal segments. Left ventricular diastolic parameters are consistent with Grade I diastolic dysfunction (impaired relaxation).  2. Right ventricular systolic function is normal. The right ventricular size is normal.  3. The mitral valve is abnormal. Trivial mitral valve regurgitation. No evidence of mitral stenosis.  4. The aortic valve is tricuspid. There is moderate  calcification of the aortic valve. There is moderate thickening of the aortic valve. Aortic valve regurgitation is not visualized. Mild aortic valve stenosis.  5. The inferior vena cava is normal in size with greater than 50% respiratory variability, suggesting right atrial pressure of 3 mmHg. FINDINGS  Left Ventricle: Left ventricular ejection fraction, by estimation, is 55 to 60%. The left ventricle has normal function. The left ventricle has no regional wall motion abnormalities. The left ventricular internal cavity size was normal in size. There is  moderate asymmetric left ventricular hypertrophy of the basal and septal segments. Left ventricular diastolic parameters are consistent with Grade I diastolic dysfunction (impaired relaxation). Right Ventricle: The right ventricular size is normal. No increase in right ventricular wall thickness. Right ventricular systolic function is normal. Left Atrium: Left atrial size was normal in size. Right Atrium: Right atrial size was normal in size. Pericardium: There is no evidence of pericardial effusion. Mitral Valve: The mitral valve is abnormal. There is moderate thickening of the mitral valve leaflet(s). There is moderate calcification of the mitral valve leaflet(s). Mild mitral annular calcification. Trivial mitral valve regurgitation. No evidence of  mitral valve stenosis. Tricuspid Valve: The tricuspid valve is normal in structure. Tricuspid valve regurgitation is not demonstrated. No evidence of tricuspid stenosis. Aortic Valve: The aortic valve is tricuspid. There is moderate calcification of the aortic valve. There is moderate thickening of the aortic valve. Aortic valve regurgitation is not visualized. Mild aortic stenosis is present. Aortic valve mean gradient measures 7.0 mmHg. Aortic valve peak gradient measures 12.0 mmHg. Aortic valve area, by VTI measures 1.83 cm. Pulmonic Valve: The pulmonic valve was normal in structure. Pulmonic valve regurgitation is  not visualized. No evidence of pulmonic stenosis. Aorta: The aortic root is normal in size and structure. Venous: The inferior vena cava is normal in size with greater than 50% respiratory variability, suggesting right atrial pressure of 3 mmHg. IAS/Shunts: No atrial level shunt detected by color flow Doppler.  LEFT VENTRICLE PLAX 2D LVIDd:         3.80 cm     Diastology LVIDs:         2.70 cm     LV e' medial:    5.22  cm/s LV PW:         1.40 cm     LV E/e' medial:  13.5 LV IVS:        1.60 cm     LV e' lateral:   5.33 cm/s LVOT diam:     2.20 cm     LV E/e' lateral: 13.2 LV SV:         70 LV SV Index:   44 LVOT Area:     3.80 cm  LV Volumes (MOD) LV vol d, MOD A2C: 83.8 ml LV vol d, MOD A4C: 87.5 ml LV vol s, MOD A2C: 34.9 ml LV vol s, MOD A4C: 35.3 ml LV SV MOD A2C:     48.9 ml LV SV MOD A4C:     87.5 ml LV SV MOD BP:      50.0 ml RIGHT VENTRICLE          IVC RV Basal diam:  2.70 cm  IVC diam: 1.10 cm LEFT ATRIUM             Index       RIGHT ATRIUM           Index LA diam:        3.40 cm 2.13 cm/m  RA Area:     14.40 cm LA Vol (A2C):   54.9 ml 34.41 ml/m RA Volume:   32.20 ml  20.18 ml/m LA Vol (A4C):   66.4 ml 41.61 ml/m LA Biplane Vol: 64.1 ml 40.17 ml/m  AORTIC VALVE AV Area (Vmax):    1.80 cm AV Area (Vmean):   1.69 cm AV Area (VTI):     1.83 cm AV Vmax:           173.00 cm/s AV Vmean:          123.000 cm/s AV VTI:            0.380 m AV Peak Grad:      12.0 mmHg AV Mean Grad:      7.0 mmHg LVOT Vmax:         82.10 cm/s LVOT Vmean:        54.700 cm/s LVOT VTI:          0.183 m LVOT/AV VTI ratio: 0.48  AORTA Ao Root diam: 2.90 cm Ao Asc diam:  3.30 cm MITRAL VALVE                TRICUSPID VALVE MV Area (PHT): 2.54 cm     TR Peak grad:   18.8 mmHg MV Decel Time: 299 msec     TR Vmax:        217.00 cm/s MV E velocity: 70.30 cm/s MV A velocity: 134.00 cm/s  SHUNTS MV E/A ratio:  0.52         Systemic VTI:  0.18 m                             Systemic Diam: 2.20 cm Jenkins Rouge MD Electronically signed  by Jenkins Rouge MD Signature Date/Time: 11/15/2020/12:00:33 PM    Final     Microbiology: Recent Results (from the past 240 hour(s))  Resp Panel by RT-PCR (Flu A&B, Covid) Nasopharyngeal Swab     Status: None   Collection Time: 11/14/20  1:22 PM   Specimen: Nasopharyngeal Swab; Nasopharyngeal(NP) swabs in vial transport medium  Result Value Ref Range Status   SARS Coronavirus 2 by RT PCR NEGATIVE  NEGATIVE Final    Comment: (NOTE) SARS-CoV-2 target nucleic acids are NOT DETECTED.  The SARS-CoV-2 RNA is generally detectable in upper respiratory specimens during the acute phase of infection. The lowest concentration of SARS-CoV-2 viral copies this assay can detect is 138 copies/mL. A negative result does not preclude SARS-Cov-2 infection and should not be used as the sole basis for treatment or other patient management decisions. A negative result may occur with  improper specimen collection/handling, submission of specimen other than nasopharyngeal swab, presence of viral mutation(s) within the areas targeted by this assay, and inadequate number of viral copies(<138 copies/mL). A negative result must be combined with clinical observations, patient history, and epidemiological information. The expected result is Negative.  Fact Sheet for Patients:  EntrepreneurPulse.com.au  Fact Sheet for Healthcare Providers:  IncredibleEmployment.be  This test is no t yet approved or cleared by the [place] FDA and  has been authorized for detection and/or diagnosis of SARS-CoV-2 by FDA under an Emergency Use Authorization (EUA). This EUA will remain  in effect (meaning this test can be used) for the duration of the COVID-19 declaration under Section 564(b)(1) of the Act, 21 U.S.C.section 360bbb-3(b)(1), unless the authorization is terminated  or revoked sooner.       Influenza A by PCR NEGATIVE NEGATIVE Final   Influenza B by PCR NEGATIVE NEGATIVE  Final    Comment: (NOTE) The Xpert Xpress SARS-CoV-2/FLU/RSV plus assay is intended as an aid in the diagnosis of influenza from Nasopharyngeal swab specimens and should not be used as a sole basis for treatment. Nasal washings and aspirates are unacceptable for Xpert Xpress SARS-CoV-2/FLU/RSV testing.  Fact Sheet for Patients: EntrepreneurPulse.com.au  Fact Sheet for Healthcare Providers: IncredibleEmployment.be  This test is not yet approved or cleared by the [place] FDA and has been authorized for detection and/or diagnosis of SARS-CoV-2 by FDA under an Emergency Use Authorization (EUA). This EUA will remain in effect (meaning this test can be used) for the duration of the COVID-19 declaration under Section 564(b)(1) of the Act, 21 U.S.C. section 360bbb-3(b)(1), unless the authorization is terminated or revoked.  Performed at [facility], [place] [address], [place], [place] [address]      Labs: Basic Metabolic Panel: Recent Labs  Lab 11/14/20 1322 11/14/20 1355  NA 136 138  K 4.1 4.2  CL 104 106  CO2 20*  --   GLUCOSE 100* 101*  BUN 37* 45*  CREATININE 1.44* 1.30*  CALCIUM 9.3  --    Liver Function Tests: Recent Labs  Lab 11/14/20 1322  AST 28  ALT 18  ALKPHOS 36*  BILITOT 0.7  PROT 8.3*  ALBUMIN 4.1   No results for input(s): LIPASE, AMYLASE in the last 168 hours. No results for input(s): AMMONIA in the last 168 hours. CBC: Recent Labs  Lab 11/14/20 1351 11/14/20 1355  WBC 8.5  --   NEUTROABS 6.6  --   HGB 11.3* 12.9  HCT 34.7* 38.0  MCV 92.3  --   PLT 309  --    Cardiac Enzymes: Recent Labs  Lab 11/15/20 0648  CKTOTAL 64   BNP: BNP (last 3 results) No results for input(s): BNP in the last 8760 hours.  ProBNP (last 3 results) No results for input(s): PROBNP in the last 8760 hours.  CBG: No results for input(s): GLUCAP in the last 168 hours.     Signed:  Florencia Reasons MD, PhD,  FACP  [facility] 11/15/2020, 3:27 PM

## 2020-11-15 NOTE — Evaluation (Signed)
Physical Therapy Evaluation & Discharge Patient Details Name: Paula Haynes MRN: [id number] DOB: Dec 06, 1935 Today's Date: 11/15/2020   History of Present Illness  85 y/o female presented to ED by EMS from [place] office on 4/20 for numbness on L side of face. MRI found punctate acute infarction at R lateral thalamus. PMH: HTN, thyroid disease, CKD, anemia  Clinical Impression  PTA, patient lives alone in 3 story home and reports independence with mobility and driving. Patient overall modI for OOB mobility with no AD. Patient appears to be at her baseline. No further skilled PT needs in acute setting. No PT follow up recommended at this time.     Follow Up Recommendations No PT follow up    Equipment Recommendations  None recommended by PT    Recommendations for Other Services       Precautions / Restrictions Precautions Precautions: None Restrictions Weight Bearing Restrictions: No      Mobility  Bed Mobility Overal bed mobility: Independent                  Transfers Overall transfer level: Modified independent Equipment used: None                Ambulation/Gait Ambulation/Gait assistance: Modified independent (Device/Increase time) Gait Distance (Feet): 200 Feet Assistive device: None Gait Pattern/deviations: WFL(Within Functional Limits)        Stairs Stairs: Yes Stairs assistance: Modified independent (Device/Increase time) Stair Management: One rail Right;Step to pattern;Forwards Number of Stairs: 12    Wheelchair Mobility    Modified Rankin (Stroke Patients Only)       Balance Overall balance assessment: No apparent balance deficits (not formally assessed)                                           Pertinent Vitals/Pain Pain Assessment: No/denies pain    Home Living Family/patient expects to be discharged to:: Private residence Living Arrangements: Alone Available Help at Discharge: Family;Available  PRN/intermittently Type of Home: House Home Access: Stairs to enter Entrance Stairs-Rails: Right Entrance Stairs-Number of Steps: 2 flights Home Layout: Multi-level Home Equipment: Grab bars - toilet;Grab bars - tub/shower;Shower seat;Cane - single point      Prior Function Level of Independence: Independent;Independent with assistive device(s)         Comments: uses showerseat for bathing; independent with IADL tasks; drives. Wears back brace for comfort and support with mobility     Hand Dominance   Dominant Hand: Right    Extremity/Trunk Assessment   Upper Extremity Assessment Upper Extremity Assessment: Defer to OT evaluation    Lower Extremity Assessment Lower Extremity Assessment: Overall WFL for tasks assessed    Cervical / Trunk Assessment Cervical / Trunk Assessment: Other exceptions (back problems)  Communication   Communication: No difficulties  Cognition Arousal/Alertness: Awake/alert Behavior During Therapy: WFL for tasks assessed/performed Overall Cognitive Status: Within Functional Limits for tasks assessed                                 General Comments: most likely baseline      General Comments General comments (skin integrity, edema, etc.): very active    Exercises     Assessment/Plan    PT Assessment Patent does not need any further PT services  PT Problem List  PT Treatment Interventions      PT Goals (Current goals can be found in the Care Plan section)  Acute Rehab PT Goals Patient Stated Goal: to go home PT Goal Formulation: With patient    Frequency     Barriers to discharge        Co-evaluation               AM-PAC PT "6 Clicks" Mobility  Outcome Measure Help needed turning from your back to your side while in a flat bed without using bedrails?: None Help needed moving from lying on your back to sitting on the side of a flat bed without using bedrails?: None Help needed moving to and from  a bed to a chair (including a wheelchair)?: None Help needed standing up from a chair using your arms (e.g., wheelchair or bedside chair)?: None Help needed to walk in hospital room?: None Help needed climbing 3-5 steps with a railing? : None 6 Click Score: 24    End of Session Equipment Utilized During Treatment: Back brace Activity Tolerance: Patient tolerated treatment well Patient left: in chair;with call bell/phone within reach;with chair alarm set Nurse Communication: Mobility status PT Visit Diagnosis: Muscle weakness (generalized) (M62.81)    Time: 0947-0962 PT Time Calculation (min) (ACUTE ONLY): 28 min   Charges:   PT Evaluation $PT Eval Low Complexity: 1 Low          Lyndell Gillyard A. Gilford Rile PT, DPT Acute Rehabilitation Services Pager [phone] Office [phone]   Linna Hoff 11/15/2020, 10:18 AM

## 2020-11-15 NOTE — Progress Notes (Incomplete)
  Echocardiogram 2D Echocardiogram has been performed.  Cammy Brochure 11/15/2020, 11:33 AM

## 2020-11-15 NOTE — Progress Notes (Addendum)
STROKE TEAM PROGRESS NOTE   INTERVAL HISTORY  Paula Haynes presented to her PCP's office with complaints of left facial numbness.  EMS was called and she was transported to the ED with concerns for a stroke.  BP per EMS was 200/116 and HR was 130.  MRI brained showed punctate acute infarction at the right thalamus.  Afebrile, VSS.  BP improved. Neuro exam: Awake and alert. Attention is normal. Pleasant and cooperative. Fully oriented. Speech fluent without evidence of aphasia.  Able to follow all commands without difficulty.  Mild tingling sensation around the left side of her mouth.   Vitals:   11/15/20 0245 11/15/20 0323 11/15/20 0728 11/15/20 1155  BP: (!) 143/82 (!) 151/75 (!) 169/91 (!) 184/86  Pulse: 65 72 81 77  Resp: 13 16 16 18   Temp:  98 F (36.7 C) 99.3 F (37.4 C) 98.8 F (37.1 C)  TempSrc:  Oral Oral Oral  SpO2: 97% 100% 98% 94%  Weight:  59.3 kg    Height:  5\' 2"  (1.575 m)     CBC:  Recent Labs  Lab 11/14/20 1351 11/14/20 1355  WBC 8.5  --   NEUTROABS 6.6  --   HGB 11.3* 12.9  HCT 34.7* 38.0  MCV 92.3  --   PLT 309  --    Basic Metabolic Panel:  Recent Labs  Lab 11/14/20 1322 11/14/20 1355  NA 136 138  K 4.1 4.2  CL 104 106  CO2 20*  --   GLUCOSE 100* 101*  BUN 37* 45*  CREATININE 1.44* 1.30*  CALCIUM 9.3  --    Lipid Panel:  Recent Labs  Lab 11/15/20 0154  CHOL 177  TRIG 51  HDL 64  CHOLHDL 2.8  VLDL 10  LDLCALC 103*   HgbA1c:  Recent Labs  Lab 11/15/20 0153  HGBA1C 6.1*   Urine Drug Screen:  Recent Labs  Lab 11/14/20 1253  LABOPIA NONE DETECTED  COCAINSCRNUR NONE DETECTED  LABBENZ NONE DETECTED  AMPHETMU NONE DETECTED  THCU NONE DETECTED  LABBARB NONE DETECTED    Alcohol Level  Recent Labs  Lab 11/14/20 1322  ETH <10    IMAGING past 24 hours CT ANGIO HEAD NECK W WO CM  Result Date: 11/15/2020  IMPRESSION:  1. No intracranial large or medium vessel occlusion or correctable proximal stenosis.  2. Atherosclerotic  disease at both carotid bifurcations but without stenosis.  3. Aortic atherosclerosis with irregular plaque. Aortic Atherosclerosis (ICD10-I70.0).   MR BRAIN WO CONTRAST  Result Date: 11/14/2020 IMPRESSION:  1. Punctate acute infarction at the right lateral thalamus.  2. Extensive old small vessel ischemic changes throughout the brain as outlined above. This includes numerous old bilateral thalamic small-vessel infarctions. Many of the old small vessel insults are associated with hemosiderin deposition, but there is no evidence of acute hemorrhage by either today's CT or this MRI.    ECHOCARDIOGRAM COMPLETE  Result Date: 11/15/2020 IMPRESSIONS   1. Left ventricular ejection fraction, by estimation, is 55 to 60%. The left ventricle has normal function. The left ventricle has no regional wall motion abnormalities. There is moderate asymmetric left ventricular hypertrophy of the basal and septal segments. Left ventricular diastolic parameters are consistent with Grade I diastolic dysfunction (impaired relaxation).   2. Right ventricular systolic function is normal. The right ventricular size is normal.   3. The mitral valve is abnormal. Trivial mitral valve regurgitation. No evidence of mitral stenosis.   4. The aortic valve is tricuspid. There is moderate  calcification of the aortic valve. There is moderate thickening of the aortic valve. Aortic valve regurgitation is not visualized. Mild aortic valve stenosis.   5. The inferior vena cava is normal in size with greater than 50% respiratory variability, suggesting right atrial pressure of 3 mmHg.   PHYSICAL EXAM HEENT-  [name]    Lungs- Respirations unlabored Extremities- Warm and well perfused  Neurological Examination Mental Status: Awake and alert. Attention is normal. Pleasant and cooperative. Fully oriented. Speech fluent without evidence of aphasia.  Able to follow all commands without difficulty. Cranial Nerves: II:  Temporal visual  fields intact with no extinction to DSS. PERRL.   III,IV, VI: No ptosis. EOMI. No nystagmus.  V,VII: Temperature sensation equal bilaterally. No facial droop. No evidence for weakness of perioral musculature.  VIII: Hearing intact to voice IX,X: No hypophonia XI: Symmetric XII: Midline tongue extension Motor: Right :  Upper extremity   5/5                                      Left:     Upper extremity   5/5             Lower extremity   5/5                                                  Lower extremity   5/5 No pronator drift.  Sensory: Temp and light touch intact throughout, bilaterally. Mild tingling sensation around left side of her mouth.  Cerebellar: No ataxia with FNF bilaterally Gait: Deferred  ASSESSMENT/PLAN Paula Haynes is a 85 y.o. female with history of HTN and thyroid disease presenting with acute lacunar infarction of the right lateral thalamus.   Stroke:  acute lacunar infarction of the right lateral thalamus likely secondary to small vessel disease.  CT head: no acute abnormality. No acute intracranial abnormality. Atrophy with chronic small vessel white matter ischemic disease.  CTA head & neck: No intracranial large or medium vessel occlusion or correctable proximal stenosis.Atherosclerotic disease   MRI: Punctate acute infarction at the right lateral thalamus. Extensive old small vessel ischemic changes   2D Echo: 55 to 39%, grade I diastolic dysfunction  LDL 103  HgbA1c 6.1  VTE prophylaxis Lovenox  Heart healthy  aspirin 81 mg daily prior to admission, now on aspirin 81 mg daily and clopidogrel 75 mg daily. continue Aspirin and plavix for 3 weeks then plavix alone.  Therapy recommendations:  none  Disposition:  Home  Hypertension  Home meds: amlodipine 10mg  daily, lisiopril 30mg  daily  Currently stable . SBP goal <180 gradually normalize in 5-7 days . Long-term BP goal normotensive  Hyperlipidemia  Home meds: none  LDL 103, goal <  70  Add lipitor 40 mg daily  Continue statin at discharge  Other Stroke Risk Factors  Advanced Age >/= 27   Family hx stroke (parent)  Other Active Problems  Frequent PACs - no afib on tele - recommend 30 day cardiac event monitoring as outpt  Hospital day # 0  Lissy Olivencia-Simmons, ACNP-BC Stroke nurse practitioner  ATTENDING NOTE: I reviewed above note and agree with the assessment and plan.   85 year old female with history of hypertension admitted for left-sided numbness and left facial droop.  CT no acute abnormality, but old bilateral BG lacunar infarcts.  CTA head and neck unremarkable, no LVO.  MRI showed acute right thalamic infarct.  A1c 6.1, LDL 103, creatinine 1.30.  Etiology for patient stroke likely small vessel disease.  Currently on aspirin 81 and Plavix 75 DAPT for 3 weeks and then Plavix alone.  Put on Lipitor 40, continue on discharge.  Patient admission EKG showed frequent PACs, overnight telemetry no A. fib.  Recommend 30-day cardiac event monitoring as outpatient to rule out A. fib.  PT/OT no recommendations.  Okay to be discharged from neuro standpoint.  For detailed assessment and plan, please refer to above as I have made changes wherever appropriate.   Neurology will sign off. Please call with questions. Pt will follow up with stroke clinic NP at [facility] in about 4 weeks. Thanks for the consult.  Rosalin Hawking, MD PhD Stroke Neurology 11/16/2020 12:09 AM     To contact Stroke Continuity provider, please refer to [email or web address] After hours, contact General Neurology

## 2020-11-15 NOTE — Progress Notes (Signed)
Occupational Therapy Evaluation Patient Details Name: Paula Haynes MRN: [id number] DOB: December 29, 1935 Today's Date: 11/15/2020    History of Present Illness 85 y.o. female admitted with L facial numbnessMRI+ Punctate acute infarction at the right thalamus. Medical history significant of anemia, CKD, hypertension, hypothyroidism, elevated protein.   Clinical Impression   PTA pt lives alone independently, including completing her own IADL tasks and driving. Pt appears to be functioning at her baseline. No further OT services needed. OT signing off.     Follow Up Recommendations  No OT follow up    Equipment Recommendations  None recommended by OT    Recommendations for Other Services       Precautions / Restrictions Precautions Precautions: None      Mobility Bed Mobility Overal bed mobility: Independent                  Transfers Overall transfer level: Modified independent                    Balance Overall balance assessment: No apparent balance deficits (not formally assessed)                                         ADL either performed or assessed with clinical judgement   ADL Overall ADL's : At baseline                                             Vision Baseline Vision/History: Wears glasses Wears Glasses:  (driving only at night) Vision Assessment?: Yes;No apparent visual deficits Eye Alignment: Within Functional Limits Alignment/Gaze Preference: Within Defined Limits Tracking/Visual Pursuits: Able to track stimulus in all quads without difficulty Saccades: Within functional limits Convergence: Within functional limits Visual Fields: No apparent deficits     Agricultural engineer Tested?: Yes Comments: no apparent deficits   Praxis Praxis Praxis tested?: Within functional limits    Pertinent Vitals/Pain Pain Assessment: No/denies pain     Hand Dominance Right   Extremity/Trunk  Assessment Upper Extremity Assessment Upper Extremity Assessment: Overall WFL for tasks assessed   Lower Extremity Assessment Lower Extremity Assessment: Defer to PT evaluation   Cervical / Trunk Assessment Cervical / Trunk Assessment: Other exceptions (back problems)   Communication Communication Communication: No difficulties   Cognition Arousal/Alertness: Awake/alert Behavior During Therapy: WFL for tasks assessed/performed Overall Cognitive Status: Within Functional Limits for tasks assessed                                 General Comments: most likely baseline1   General Comments  very active    Exercises     Shoulder Instructions      Home Living Family/patient expects to be discharged to:: Private residence Living Arrangements: Alone Available Help at Discharge: Family;Available PRN/intermittently Type of Home: House Home Access: Stairs to enter [employer] of Steps: 2 flights Entrance Stairs-Rails: Right Home Layout: Multi-level     Bathroom Shower/Tub: Walk-in shower;Tub/shower unit   Bathroom Toilet: Standard Bathroom Accessibility: Yes How Accessible: Accessible via walker Home Equipment: Grab bars - toilet;Grab bars - tub/shower;Shower seat;Cane - single point (walking stick)          Prior Functioning/Environment Level of Independence: Independent;Independent  with assistive device(s)        Comments: uses showerseat for bathing; independent with IADL tasks; drives        OT Problem List: Impaired sensation      OT Treatment/Interventions:      OT Goals(Current goals can be found in the care plan section) Acute Rehab OT Goals Patient Stated Goal: to go home OT Goal Formulation: All assessment and education complete, DC therapy  OT Frequency:     Barriers to D/C:            Co-evaluation              AM-PAC OT "6 Clicks" Daily Activity     Outcome Measure Help from another person eating meals?:  None Help from another person taking care of personal grooming?: None Help from another person toileting, which includes using toliet, bedpan, or urinal?: None Help from another person bathing (including washing, rinsing, drying)?: None Help from another person to put on and taking off regular upper body clothing?: None Help from another person to put on and taking off regular lower body clothing?: None 6 Click Score: 24   End of Session Nurse Communication: Mobility status  Activity Tolerance: Patient tolerated treatment well Patient left: Other (comment) (with transport going to CT)  OT Visit Diagnosis: Other (comment) (sensory deficits)                Time: 6945-0388 OT Time Calculation (min): 16 min Charges:  OT General Charges $OT Visit: 1 Visit OT Evaluation $OT Eval Low Complexity: [place], OT/L   Acute OT Clinical Specialist Acute Rehabilitation Services Pager [phone] Office [phone]   [facility] 11/15/2020, 10:00 AM

## 2020-11-19 ENCOUNTER — Telehealth: Payer: Self-pay

## 2020-11-19 NOTE — Telephone Encounter (Signed)
Transition Care Management Follow-up Telephone Call  Date of discharge and from where: 11/15/2020-[place]  How have you been since you were released from the hospital? Doing ok  Any questions or concerns? No  Items Reviewed:  Did the pt receive and understand the discharge instructions provided? Yes   Medications obtained and verified? Yes   Other? Yes   Any new allergies since your discharge? No   Dietary orders reviewed? Yes  Do you have support at home? Yes   Home Care and Equipment/Supplies: Were home health services ordered? no If so, what is the name of the agency? n/a  Has the agency set up a time to come to the patient's home? not applicable Were any new equipment or medical supplies ordered?  No What is the name of the medical supply agency? n/a Were you able to get the supplies/equipment? not applicable Do you have any questions related to the use of the equipment or supplies? n/a  Functional Questionnaire: (I = Independent and D = Dependent) ADLs: I  Bathing/Dressing- I  Meal Prep- I  Eating- I  Maintaining continence- I  Transferring/Ambulation- I  Managing Meds- I  Follow up appointments reviewed:   PCP Hospital f/u appt confirmed? Yes  Scheduled to see Dr. Raoul Pitch on 11/27/2020 @ 11:30.  [facility] f/u appt confirmed? No  Neurology to call patient to schedule  Are transportation arrangements needed? No   If their condition worsens, is the pt aware to call PCP or go to the Emergency Dept.? Yes  Was the patient provided with contact information for the PCP's office or ED? Yes  Was to pt encouraged to call back with questions or concerns? Yes

## 2020-11-27 ENCOUNTER — Encounter: Payer: Self-pay | Admitting: Family Medicine

## 2020-11-27 ENCOUNTER — Telehealth: Payer: Self-pay | Admitting: Family Medicine

## 2020-11-27 ENCOUNTER — Ambulatory Visit (INDEPENDENT_AMBULATORY_CARE_PROVIDER_SITE_OTHER): Payer: PPO | Admitting: Family Medicine

## 2020-11-27 ENCOUNTER — Other Ambulatory Visit: Payer: Self-pay

## 2020-11-27 VITALS — BP 149/75 | HR 98 | Temp 98.9°F | Ht 62.0 in | Wt 123.0 lb

## 2020-11-27 DIAGNOSIS — Z9289 Personal history of other medical treatment: Secondary | ICD-10-CM

## 2020-11-27 DIAGNOSIS — E785 Hyperlipidemia, unspecified: Secondary | ICD-10-CM | POA: Diagnosis not present

## 2020-11-27 DIAGNOSIS — Z8673 Personal history of transient ischemic attack (TIA), and cerebral infarction without residual deficits: Secondary | ICD-10-CM | POA: Diagnosis not present

## 2020-11-27 DIAGNOSIS — I10 Essential (primary) hypertension: Secondary | ICD-10-CM

## 2020-11-27 DIAGNOSIS — I639 Cerebral infarction, unspecified: Secondary | ICD-10-CM

## 2020-11-27 LAB — CBC
HCT: 35 % — ABNORMAL LOW (ref 36.0–46.0)
Hemoglobin: 11.5 g/dL — ABNORMAL LOW (ref 12.0–15.0)
MCHC: 33 g/dL (ref 30.0–36.0)
MCV: 91 fl (ref 78.0–100.0)
Platelets: 213 10*3/uL (ref 150.0–400.0)
RBC: 3.84 Mil/uL — ABNORMAL LOW (ref 3.87–5.11)
RDW: 13.5 % (ref 11.5–15.5)
WBC: 4.4 10*3/uL (ref 4.0–10.5)

## 2020-11-27 LAB — COMPREHENSIVE METABOLIC PANEL
ALT: 14 U/L (ref 0–35)
AST: 22 U/L (ref 0–37)
Albumin: 4.1 g/dL (ref 3.5–5.2)
Alkaline Phosphatase: 38 U/L — ABNORMAL LOW (ref 39–117)
BUN: 39 mg/dL — ABNORMAL HIGH (ref 6–23)
CO2: 24 mEq/L (ref 19–32)
Calcium: 9.5 mg/dL (ref 8.4–10.5)
Chloride: 102 mEq/L (ref 96–112)
Creatinine, Ser: 1.66 mg/dL — ABNORMAL HIGH (ref 0.40–1.20)
GFR: 28.4 mL/min — ABNORMAL LOW (ref >60.00)
Glucose, Bld: 124 mg/dL — ABNORMAL HIGH (ref 70–99)
Potassium: 4.1 mEq/L (ref 3.5–5.1)
Sodium: 136 mEq/L (ref 135–145)
Total Bilirubin: 0.4 mg/dL (ref 0.2–1.2)
Total Protein: 7.3 g/dL (ref 6.0–8.3)

## 2020-11-27 LAB — TSH: TSH: 6.59 u[IU]/mL — ABNORMAL HIGH (ref 0.35–4.50)

## 2020-11-27 MED ORDER — LISINOPRIL 40 MG PO TABS: ORAL_TABLET | ORAL | 1 refills | Status: DC

## 2020-11-27 MED ORDER — AMLODIPINE BESYLATE 10 MG PO TABS: 10.0000 mg | ORAL_TABLET | Freq: Every morning | ORAL | 1 refills | Status: DC

## 2020-11-27 MED ORDER — CLOPIDOGREL BISULFATE 75 MG PO TABS: 75.0000 mg | ORAL_TABLET | Freq: Every day | ORAL | 3 refills | Status: DC

## 2020-11-27 MED ORDER — ATORVASTATIN CALCIUM 40 MG PO TABS: 40.0000 mg | ORAL_TABLET | Freq: Every day | ORAL | 3 refills | Status: DC

## 2020-11-27 NOTE — Telephone Encounter (Signed)
Please inform patient Her kidney function is decreased from her prior collections with a creatinine of 1.66.  This decreases her GFR to 28.4, and she normally is around 30-33 For her GFR. I would encourage her to hydrate.  Her labs indicate mild dehydration.  Her blood cell counts are stable.  No signs of blood loss since starting Plavix.  Her thyroid levels are also elevated to 6.59.  She follows with endocrinology for her thyroid currently.  I would encourage her to schedule an appointment with her endocrinologist for follow-up, repeat labs and if necessary adjustment on her thyroid meds.  An abnormal thyroid level can cause patient's to have an arrhythmia such as A. Fib.

## 2020-11-27 NOTE — Progress Notes (Signed)
Paula Haynes , 1936-02-26, 85 y.o., female MRN: [id number] Patient Care Team    Relationship Specialty Notifications Start End  Ma Hillock, DO PCP - General Family Medicine  12/28/17   Jacelyn Pi, MD Consulting Physician Endocrinology  11/16/17   Murlean Iba, MD  Nephrology  08/30/19   Brendolyn Patty, MD  Dermatology  08/30/19   Gardiner Barefoot, [place] Consulting Physician Podiatry  08/30/19   Susa Day, MD Consulting Physician Orthopedic Surgery  08/30/19   Leandrew Koyanagi, MD Referring Physician Ophthalmology  08/30/19     Chief Complaint  Patient presents with  . Hospitalization Follow-up    Pt is not fasting     Subjective:  Paula Haynes  is a 85 y.o. female presents for hospital follow up after recent admission on 11/14/2020 for primary diagnosis acute CVA. Pt was discharged on 11/15/2020 to home. Patients discharge summary has been reviewed, as well as all labs/image studies obtained during hospitalization.   Patients hospital course: Patient arrived via EMS to the emergency room for unilateral facial numbness that had occurred from last 24 hours.  She describes the discomfort as a tingling numbness sensation of her lip and roof of her mouth.  She was noted to have a mild facial droop present.  Upon arriving to the ED she was tachycardic and blood pressures were elevated.  Labs were basically at her baseline. Since hospital discharge patient reports she is doing very well.  She states she is under more stress and very busy lately secondary to attempting to selling one of her homes.  She is tolerating the new medication started, Crestor and Plavix.  She reports she has her neurology appointment scheduled for next week.  MRI brain 11/14/2020: IMPRESSION: 1. Punctate acute infarction at the right lateral thalamus. 2. Extensive old small vessel ischemic changes throughout the brain as outlined above. This includes numerous old bilateral thalamic small-vessel infarctions.  Many of the old small vessel insults are associated with hemosiderin deposition, but I do not see evidence of acute hemorrhage by either today's CT or this MRI.  CT angio neck 11/15/2020: IMPRESSION: 1. No intracranial large or medium vessel occlusion or correctable proximal stenosis. 2. Atherosclerotic disease at both carotid bifurcations but without stenosis. 3. Aortic atherosclerosis with irregular plaque.  CT head without contrast 11/14/2020: IMPRESSION: 1. No acute intracranial abnormality. 2. Atrophy with chronic small vessel white matter ischemic disease. 3. Age indeterminate lacunar infarctions in the basal ganglia bilaterally. No results for input(s): HGB, HCT, WBC, PLT in the last 168 hours. CMP Latest Ref Rng & Units 11/14/2020 11/14/2020 09/26/2020  Glucose 70 - 99 mg/dL 101(H) 100(H) 92  BUN 8 - 23 mg/dL 45(H) 37(H) 53(H)  Creatinine 0.44 - 1.00 mg/dL 1.30(H) 1.44(H) 1.57(H)  Sodium 135 - 145 mmol/L 138 136 149(H)  Potassium 3.5 - 5.1 mmol/L 4.2 4.1 5.7(H)  Chloride 98 - 111 mmol/L 106 104 111(H)  CO2 22 - 32 mmol/L - 20(L) 14(L)  Calcium 8.9 - 10.3 mg/dL - 9.3 9.5  Total Protein 6.5 - 8.1 g/dL - 8.3(H) -  Total Bilirubin 0.3 - 1.2 mg/dL - 0.7 -  Alkaline Phos 38 - 126 U/L - 36(L) -  AST 15 - 41 U/L - 28 -  ALT 0 - 44 U/L - 18 -     Depression screen [facility] 2/9 09/26/2020 02/22/2019 12/28/2017 11/16/2017 02/15/2016  Decreased Interest 0 0 0 1 0  Down, Depressed, Hopeless 0 0 0 0 0  PHQ -  2 Score 0 0 0 1 0    No Known Allergies Social History   Tobacco Use  . Smoking status: Never Smoker  . Smokeless tobacco: Never Used  Substance Use Topics  . Alcohol use: No    Alcohol/week: 0.0 standard drinks   Past Medical History:  Diagnosis Date  . Arthritis   . History of blood product transfusion   . Hyperproteinemia 02/28/2019  . Hypertension   . Thyroid disease    Past Surgical History:  Procedure Laterality Date  . FINGER SURGERY     right middle finger/due to  glass window cutting finger  . REFRACTIVE SURGERY     bil   Family History  Problem Relation Age of Onset  . Heart disease Father   . Arthritis Other        parent  . Hypertension Other        parent  . Stroke Other        parent   Allergies as of 11/27/2020   No Known Allergies      Medication List        Accurate as of Nov 27, 2020 11:38 AM. If you have any questions, ask your nurse or doctor.          amLODipine 10 MG tablet Commonly known as: NORVASC TAKE 1 TABLET(10 MG) BY MOUTH DAILY What changed:   how much to take  how to take this  when to take this  additional instructions   aspirin 81 MG tablet Take 1 tablet (81 mg total) by mouth at bedtime. Stop taking after 21days   atorvastatin 40 MG tablet Commonly known as: LIPITOR Take 1 tablet (40 mg total) by mouth daily.   Blood Pressure Kit Devi 1 Device by Does not apply route daily. Large cuff   clopidogrel 75 MG tablet Commonly known as: PLAVIX Take 1 tablet (75 mg total) by mouth daily.   lisinopril 30 MG tablet Commonly known as: ZESTRIL TAKE 1 TABLET(30 MG) BY MOUTH DAILY What changed:   how much to take  how to take this  when to take this  additional instructions   NON FORMULARY Take 1 capsule by mouth See admin instructions. Omega-3 capsule: Take 1 capsule by mouth at bedtime   Opcon-A 0.027-0.315 % Soln Generic drug: Naphazoline-Pheniramine Place 1-2 drops into both eyes 4 (four) times daily as needed (for seasonal allergies).   Prolia 60 MG/ML Sosy injection Generic drug: denosumab Inject into the skin every 6 (six) months.   sodium bicarbonate 650 MG tablet Take 1,300 mg by mouth 2 (two) times daily.   Synthroid 100 MCG tablet Generic drug: levothyroxine Take 100 mcg by mouth daily before breakfast.   Vitamin D3 125 MCG (5000 UT) Caps Take 5,000 Units by mouth at bedtime.        All past medical history, surgical history, allergies, family history,  immunizations and medications were updated in the EMR today and reviewed under the history and medication portions of their EMR.      ROS: Negative, with the exception of above mentioned in HPI   Objective:  BP (!) 149/75   Pulse 98   Temp 98.9 F (37.2 C) (Oral)   Ht '5\' 2"'  (1.575 m)   Wt 123 lb (55.8 kg)   SpO2 98%   BMI 22.50 kg/m  Body mass index is 22.5 kg/m. Gen: Afebrile. No acute distress. Nontoxic in appearance, well developed, well nourished.  Very pleasant female. HENT: AT. [place]. Bilateral  TM visualized and normal limits. MMM, no oral lesions. Bilateral nares within normal limits. Throat without erythema or exudates.  No cough.  No hoarseness. Eyes:Pupils Equal Round Reactive to light, Extraocular movements intact,  Conjunctiva without redness, discharge or icterus. Neck/lymp/endocrine: Supple, no lymphadenopathy, no thyromegaly. CV: RRR no murmur, no edema Chest: CTAB, no wheeze or crackles. Good air movement, normal resp effort.  Skin: no rashes, purpura or petechiae.  Neuro: Normal gait. PERLA. EOMi. Alert. Oriented x3 Cranial nerves II through XII intact.  Normal facies/symmetrical present. Psych: Normal affect, dress and demeanor. Normal speech. Normal thought content and judgment.   Assessment/Plan: Paula Haynes is a 85 y.o. female present for OV for Hospital discharge follow up Acute CVA (cerebrovascular accident) (HCC)/chronic anticoagulation/hypertension Patient's neurological symptoms are completely resolved. - CBC - Comp Met (CMET) -Continue aspirin for 21 days.  Then discontinue. -Continue Plavix indefinitely.  Patient understands instructions on aspirin/Plavix. - Increase lisinopril from 30 mg to 40 mg daily.  Encouraged tighter control on her blood pressure, she understands goal is <130/80 -Continue amlodipine 10 mg daily. - Continue Lipitor 40 mg daily.  She is tolerating without side effects.  She understands LDL goal is now <70. - Ambulatory  referral to Cardiology> rule out cardiac cause/A. fib. - TSH   Reviewed expectations re: course of current medical issues.  Discussed self-management of symptoms.  Outlined signs and symptoms indicating need for more acute intervention.  Patient verbalized understanding and all questions were answered.  Patient received an After-Visit Summary.  Any changes in medications were reviewed and patient was provided with updated med list with their AVS.       Orders Placed This Encounter  Procedures  . CBC  . Comp Met (CMET)   Meds ordered this encounter  Medications  . clopidogrel (PLAVIX) 75 MG tablet    Sig: Take 1 tablet (75 mg total) by mouth daily.    Dispense:  90 tablet    Refill:  3  . lisinopril (ZESTRIL) 40 MG tablet    Sig: TAKE 1 TABLET BY MOUTH DAILY    Dispense:  90 tablet    Refill:  1    Please discontinue prior dose.  Marland Kitchen amLODipine (NORVASC) 10 MG tablet    Sig: Take 1 tablet (10 mg total) by mouth in the morning.    Dispense:  90 tablet    Refill:  1  . atorvastatin (LIPITOR) 40 MG tablet    Sig: Take 1 tablet (40 mg total) by mouth daily.    Dispense:  90 tablet    Refill:  3    Referral Orders     Ambulatory referral to Cardiology   Note is dictated utilizing voice recognition software. Although note has been proof read prior to signing, occasional typographical errors still can be missed. If any questions arise, please do not hesitate to call for verification.   electronically signed by:  Howard Pouch, DO  [place]

## 2020-11-27 NOTE — Patient Instructions (Addendum)
  Great to see you today.    If labs were collected, we will inform you of lab results once received either by echart message or telephone call.   - echart message- for normal results that have been seen by the patient already.   - telephone call: abnormal results or if patient has not viewed results in their echart.  Stop baby aspirin after the 21 days they discussed, but continue the palvix daily. I have refilled this for you.   You will need to follow up with a neurologist - I am glad you got your appt scheduled.   Continue lisinopril at 40 mg new dose. Goal BP 120s/70s.   Continue amlodipine 10 mg tab   Keep your routine follow up here which is scheduled August 1st.

## 2020-11-28 NOTE — Telephone Encounter (Signed)
Spoke with pt regarding labs and instructions.   

## 2020-11-29 ENCOUNTER — Encounter: Payer: Self-pay | Admitting: Family Medicine

## 2020-11-29 DIAGNOSIS — E785 Hyperlipidemia, unspecified: Secondary | ICD-10-CM | POA: Insufficient documentation

## 2020-11-30 ENCOUNTER — Encounter: Payer: Self-pay | Admitting: Cardiology

## 2020-11-30 ENCOUNTER — Ambulatory Visit (INDEPENDENT_AMBULATORY_CARE_PROVIDER_SITE_OTHER): Payer: PPO

## 2020-11-30 ENCOUNTER — Ambulatory Visit: Payer: PPO

## 2020-11-30 ENCOUNTER — Other Ambulatory Visit: Payer: Self-pay

## 2020-11-30 ENCOUNTER — Ambulatory Visit: Payer: PPO | Admitting: Cardiology

## 2020-11-30 ENCOUNTER — Telehealth: Payer: Self-pay

## 2020-11-30 ENCOUNTER — Other Ambulatory Visit: Payer: Self-pay | Admitting: *Deleted

## 2020-11-30 VITALS — BP 140/80 | HR 96 | Ht 62.0 in | Wt 123.0 lb

## 2020-11-30 DIAGNOSIS — I350 Nonrheumatic aortic (valve) stenosis: Secondary | ICD-10-CM | POA: Insufficient documentation

## 2020-11-30 DIAGNOSIS — I10 Essential (primary) hypertension: Secondary | ICD-10-CM

## 2020-11-30 DIAGNOSIS — N183 Chronic kidney disease, stage 3 unspecified: Secondary | ICD-10-CM | POA: Diagnosis not present

## 2020-11-30 DIAGNOSIS — E785 Hyperlipidemia, unspecified: Secondary | ICD-10-CM

## 2020-11-30 DIAGNOSIS — I639 Cerebral infarction, unspecified: Secondary | ICD-10-CM

## 2020-11-30 DIAGNOSIS — Z8673 Personal history of transient ischemic attack (TIA), and cerebral infarction without residual deficits: Secondary | ICD-10-CM | POA: Insufficient documentation

## 2020-11-30 DIAGNOSIS — I4891 Unspecified atrial fibrillation: Secondary | ICD-10-CM | POA: Diagnosis not present

## 2020-11-30 MED ORDER — METOPROLOL SUCCINATE ER 50 MG PO TB24: 50.0000 mg | ORAL_TABLET | Freq: Every day | ORAL | 3 refills | Status: DC

## 2020-11-30 NOTE — Progress Notes (Signed)
Cardiology Office Note:    Date:  11/30/2020   ID:  Paula Haynes, DOB Jul 24, 1936, MRN [id number]  PCP:  Ma Hillock, DO  Cardiologist:  Jenean Lindau, MD   Referring MD: Ma Hillock, DO    ASSESSMENT:    1. Essential hypertension   2. Hyperlipidemia LDL goal <70   3. Stage 3 chronic kidney disease, unspecified whether stage 3a or 3b CKD ([place])   4. History of stroke   5. Mild aortic stenosis    PLAN:    In order of problems listed above:  1. Primary prevention stressed with the patient.  Importance of compliance with diet medication stressed and she vocalized understanding. 2. History of stroke: Medical management.  This is managed by her neurologist and primary care provider.  She is on antiplatelet therapy.  For this evaluation she will undergo monitoring to assess for any arrhythmia as a cause of the symptoms and stroke. 3. Essential hypertension: Blood pressure stable and diet was emphasized. 4. Mixed dyslipidemia: On statin therapy.  Lipids are still mildly elevated.  Discussed diet. 5. Mild aortic stenosis: Medical management at this time. 6. Patient will be seen in follow-up appointment in 3 months or earlier if the patient has any concerns    Medication Adjustments/Labs and Tests Ordered: Current medicines are reviewed at length with the patient today.  Concerns regarding medicines are outlined above.  No orders of the defined types were placed in this encounter.  No orders of the defined types were placed in this encounter.    History of Present Illness:    Paula Haynes is a 85 y.o. female who is being seen today for the evaluation of history of stroke and mild aortic stenosis at the request of Kuneff, Renee A, DO.  Patient is a pleasant 85 year old female.  She recently has been treated for stroke.  She overall is an active lady.  She denies any chest pain orthopnea or PND.  Her CT of the head revealed multiple areas of infarcts.  Echocardiogram  revealed preserved systolic function with mild aortic stenosis.  She brings in a monitor with her today to be put on.  She is being evaluated for possibility of arrhythmia causing thromboembolic stroke.  At the time of my evaluation, the patient is alert awake oriented and in no distress.  Past Medical History:  Diagnosis Date  . Arthritis   . Chronic right-sided lumbar radiculopathy 10/17/2020  . History of blood product transfusion   . Hyperproteinemia 02/28/2019  . Hypertension   . Thyroid disease     Past Surgical History:  Procedure Laterality Date  . FINGER SURGERY     right middle finger/due to glass window cutting finger  . REFRACTIVE SURGERY     bil    Current Medications: Current Meds  Medication Sig  . amLODipine (NORVASC) 10 MG tablet Take 1 tablet (10 mg total) by mouth in the morning.  Marland Kitchen aspirin 81 MG tablet Take 1 tablet (81 mg total) by mouth at bedtime. Stop taking after 21days  . atorvastatin (LIPITOR) 40 MG tablet Take 1 tablet (40 mg total) by mouth daily.  . Blood Pressure Monitoring (BLOOD PRESSURE KIT) DEVI 1 Device by Does not apply route daily. Large cuff  . Cholecalciferol (VITAMIN D3) 5000 units CAPS Take 5,000 Units by mouth at bedtime.  . clopidogrel (PLAVIX) 75 MG tablet Take 1 tablet (75 mg total) by mouth daily.  Marland Kitchen denosumab (PROLIA) 60 MG/ML SOSY injection Inject  into the skin every 6 (six) months.  Marland Kitchen lisinopril (ZESTRIL) 40 MG tablet Take 40 mg by mouth daily.  . Naphazoline-Pheniramine (OPCON-A) 0.027-0.315 % SOLN Place 1-2 drops into both eyes 4 (four) times daily as needed (for seasonal allergies).  . Omega-3 1000 MG CAPS Take 1,000 mg by mouth daily.  . sodium bicarbonate 650 MG tablet Take 1,300 mg by mouth 2 (two) times daily.  Marland Kitchen SYNTHROID 100 MCG tablet Take 100 mcg by mouth daily before breakfast.     Allergies:   Patient has no known allergies.   Social History   Socioeconomic History  . Marital status: Married    Spouse name: Not on  file  . Number of children: Not on file  . Years of education: Not on file  . Highest education level: Not on file  Occupational History  . Not on file  Tobacco Use  . Smoking status: Never Smoker  . Smokeless tobacco: Never Used  Vaping Use  . Vaping Use: Never used  Substance and Sexual Activity  . Alcohol use: No    Alcohol/week: 0.0 standard drinks  . Drug use: No  . Sexual activity: Not Currently  Other Topics Concern  . Not on file  Social History Narrative   Widowed.  College-educated for years plus.  Self-employed/retired.   Takes a daily vitamin.   Drinks caffeine.   Smoke alarm in the home.   Wears a seatbelt.   Social Determinants of Health   Financial Resource Strain: Not on file  Food Insecurity: Not on file  Transportation Needs: Not on file  Physical Activity: Not on file  Stress: Not on file  Social Connections: Not on file     Family History: The patient's family history includes Arthritis in an other family member; Heart disease in her father; Hypertension in an other family member; Stroke in an other family member.  ROS:   Please see the history of present illness.    All other systems reviewed and are negative.  EKGs/Labs/Other Studies Reviewed:    The following studies were reviewed today: IMPRESSIONS    1. Left ventricular ejection fraction, by estimation, is 55 to 60%. The  left ventricle has normal function. The left ventricle has no regional  wall motion abnormalities. There is moderate asymmetric left ventricular  hypertrophy of the basal and septal  segments. Left ventricular diastolic parameters are consistent with Grade  I diastolic dysfunction (impaired relaxation).  2. Right ventricular systolic function is normal. The right ventricular  size is normal.  3. The mitral valve is abnormal. Trivial mitral valve regurgitation. No  evidence of mitral stenosis.  4. The aortic valve is tricuspid. There is moderate calcification of  the  aortic valve. There is moderate thickening of the aortic valve. Aortic  valve regurgitation is not visualized. Mild aortic valve stenosis.  5. The inferior vena cava is normal in size with greater than 50%  respiratory variability, suggesting right atrial pressure of 3 mmHg.     Recent Labs: 11/27/2020: ALT 14; BUN 39; Creatinine, Ser 1.66; Hemoglobin 11.5; Platelets 213.0; Potassium 4.1; Sodium 136; TSH 6.59  Recent Lipid Panel    Component Value Date/Time   CHOL 177 11/15/2020 0154   TRIG 51 11/15/2020 0154   HDL 64 11/15/2020 0154   CHOLHDL 2.8 11/15/2020 0154   VLDL 10 11/15/2020 0154   LDLCALC 103 (H) 11/15/2020 0154   LDLDIRECT 108 (H) 09/26/2020 1431    Physical Exam:    VS:  BP  140/80 (BP Location: Right Arm, Patient Position: Sitting, Cuff Size: Small)   Pulse 96   Ht '5\' 2"'  (1.575 m)   Wt 123 lb (55.8 kg)   SpO2 98%   BMI 22.50 kg/m     Wt Readings from Last 3 Encounters:  11/30/20 123 lb (55.8 kg)  11/27/20 123 lb (55.8 kg)  11/15/20 130 lb 11.7 oz (59.3 kg)     GEN: Patient is in no acute distress HEENT: Normal NECK: No JVD; No carotid bruits LYMPHATICS: No lymphadenopathy CARDIAC: S1 S2 regular, 2/6 systolic murmur at the apex. RESPIRATORY:  Clear to auscultation without rales, wheezing or rhonchi  ABDOMEN: Soft, non-tender, non-distended MUSCULOSKELETAL:  No edema; No deformity  SKIN: Warm and dry NEUROLOGIC:  Alert and oriented x 3 PSYCHIATRIC:  Normal affect    Signed, Jenean Lindau, MD  11/30/2020 10:17 AM    [facility]

## 2020-11-30 NOTE — Patient Instructions (Signed)

## 2020-11-30 NOTE — Telephone Encounter (Signed)
Spoke to the patient just now and let her know Dr. Julien Nordmann recommendations to   1. STOP AMLODIPINE  2. START TOPROL XL 50 MG ONCE DAILY.  3. TAKE YOUR BLOOD PRESSURE ONCE DAILY AND SEND [place] A MESSAGE WITH THESE READINGS IN ONE WEEK.

## 2020-11-30 NOTE — Telephone Encounter (Signed)
Spoke to [place] with [employer]. She was calling to report an event monitor report. She states that the patient had an event of SVT with rates reaching 160 bpm this morning.   I will route to Dr. Geraldo Pitter to make him aware.

## 2020-11-30 NOTE — Addendum Note (Signed)
Addended by: Resa Miner I on: 11/30/2020 03:38 PM   Modules accepted: Orders

## 2020-12-22 ENCOUNTER — Emergency Department (HOSPITAL_COMMUNITY): Payer: PPO

## 2020-12-22 ENCOUNTER — Inpatient Hospital Stay (HOSPITAL_COMMUNITY)
Admission: EM | Admit: 2020-12-22 | Discharge: 2020-12-26 | DRG: 071 | Disposition: A | Discharge status: Home Health | Payer: PPO | Attending: Internal Medicine | Admitting: Internal Medicine

## 2020-12-22 ENCOUNTER — Encounter (HOSPITAL_COMMUNITY): Payer: Self-pay | Admitting: Internal Medicine

## 2020-12-22 ENCOUNTER — Inpatient Hospital Stay (HOSPITAL_COMMUNITY): Payer: PPO

## 2020-12-22 DIAGNOSIS — I10 Essential (primary) hypertension: Secondary | ICD-10-CM

## 2020-12-22 DIAGNOSIS — Z20822 Contact with and (suspected) exposure to covid-19: Secondary | ICD-10-CM | POA: Diagnosis not present

## 2020-12-22 DIAGNOSIS — M25551 Pain in right hip: Secondary | ICD-10-CM

## 2020-12-22 DIAGNOSIS — S199XXA Unspecified injury of neck, initial encounter: Secondary | ICD-10-CM | POA: Diagnosis not present

## 2020-12-22 DIAGNOSIS — N183 Chronic kidney disease, stage 3 unspecified: Secondary | ICD-10-CM | POA: Diagnosis present

## 2020-12-22 DIAGNOSIS — T796XXA Traumatic ischemia of muscle, initial encounter: Secondary | ICD-10-CM | POA: Diagnosis not present

## 2020-12-22 DIAGNOSIS — G9340 Encephalopathy, unspecified: Secondary | ICD-10-CM | POA: Diagnosis not present

## 2020-12-22 DIAGNOSIS — Z79890 Hormone replacement therapy (postmenopausal): Secondary | ICD-10-CM | POA: Diagnosis not present

## 2020-12-22 DIAGNOSIS — I5032 Chronic diastolic (congestive) heart failure: Secondary | ICD-10-CM

## 2020-12-22 DIAGNOSIS — Z8673 Personal history of transient ischemic attack (TIA), and cerebral infarction without residual deficits: Secondary | ICD-10-CM | POA: Diagnosis not present

## 2020-12-22 DIAGNOSIS — D696 Thrombocytopenia, unspecified: Secondary | ICD-10-CM | POA: Diagnosis present

## 2020-12-22 DIAGNOSIS — Z823 Family history of stroke: Secondary | ICD-10-CM | POA: Diagnosis not present

## 2020-12-22 DIAGNOSIS — G9341 Metabolic encephalopathy: Principal | ICD-10-CM | POA: Diagnosis present

## 2020-12-22 DIAGNOSIS — R0902 Hypoxemia: Secondary | ICD-10-CM | POA: Diagnosis not present

## 2020-12-22 DIAGNOSIS — R410 Disorientation, unspecified: Secondary | ICD-10-CM | POA: Diagnosis not present

## 2020-12-22 DIAGNOSIS — E785 Hyperlipidemia, unspecified: Secondary | ICD-10-CM | POA: Diagnosis present

## 2020-12-22 DIAGNOSIS — M6282 Rhabdomyolysis: Secondary | ICD-10-CM | POA: Diagnosis present

## 2020-12-22 DIAGNOSIS — E876 Hypokalemia: Secondary | ICD-10-CM | POA: Diagnosis present

## 2020-12-22 DIAGNOSIS — R531 Weakness: Secondary | ICD-10-CM | POA: Diagnosis not present

## 2020-12-22 DIAGNOSIS — Z7982 Long term (current) use of aspirin: Secondary | ICD-10-CM | POA: Diagnosis not present

## 2020-12-22 DIAGNOSIS — N390 Urinary tract infection, site not specified: Secondary | ICD-10-CM | POA: Diagnosis not present

## 2020-12-22 DIAGNOSIS — R079 Chest pain, unspecified: Secondary | ICD-10-CM

## 2020-12-22 DIAGNOSIS — E871 Hypo-osmolality and hyponatremia: Secondary | ICD-10-CM

## 2020-12-22 DIAGNOSIS — Z7902 Long term (current) use of antithrombotics/antiplatelets: Secondary | ICD-10-CM | POA: Diagnosis not present

## 2020-12-22 DIAGNOSIS — I130 Hypertensive heart and chronic kidney disease with heart failure and stage 1 through stage 4 chronic kidney disease, or unspecified chronic kidney disease: Secondary | ICD-10-CM | POA: Diagnosis not present

## 2020-12-22 DIAGNOSIS — E039 Hypothyroidism, unspecified: Secondary | ICD-10-CM | POA: Diagnosis not present

## 2020-12-22 DIAGNOSIS — N1832 Chronic kidney disease, stage 3b: Secondary | ICD-10-CM

## 2020-12-22 DIAGNOSIS — Z79899 Other long term (current) drug therapy: Secondary | ICD-10-CM

## 2020-12-22 DIAGNOSIS — S3993XA Unspecified injury of pelvis, initial encounter: Secondary | ICD-10-CM | POA: Diagnosis not present

## 2020-12-22 DIAGNOSIS — R21 Rash and other nonspecific skin eruption: Secondary | ICD-10-CM | POA: Diagnosis not present

## 2020-12-22 DIAGNOSIS — R000 Tachycardia, unspecified: Secondary | ICD-10-CM | POA: Diagnosis not present

## 2020-12-22 DIAGNOSIS — R4182 Altered mental status, unspecified: Secondary | ICD-10-CM | POA: Diagnosis not present

## 2020-12-22 DIAGNOSIS — Z8249 Family history of ischemic heart disease and other diseases of the circulatory system: Secondary | ICD-10-CM

## 2020-12-22 DIAGNOSIS — R404 Transient alteration of awareness: Secondary | ICD-10-CM | POA: Diagnosis not present

## 2020-12-22 HISTORY — DX: Chronic kidney disease, stage 3 unspecified: N18.30

## 2020-12-22 HISTORY — DX: Cerebral infarction, unspecified: I63.9

## 2020-12-22 LAB — CBC WITH DIFFERENTIAL/PLATELET
Abs Immature Granulocytes: 0.01 10*3/uL (ref 0.00–0.07)
Basophils Absolute: 0 10*3/uL (ref 0.0–0.1)
Basophils Relative: 0 %
Eosinophils Absolute: 0 10*3/uL (ref 0.0–0.5)
Eosinophils Relative: 0 %
HCT: 37.2 % (ref 36.0–46.0)
Hemoglobin: 12.2 g/dL (ref 12.0–15.0)
Immature Granulocytes: 0 %
Lymphocytes Relative: 14 %
Lymphs Abs: 1 10*3/uL (ref 0.7–4.0)
MCH: 30.3 pg (ref 26.0–34.0)
MCHC: 32.8 g/dL (ref 30.0–36.0)
MCV: 92.3 fL (ref 80.0–100.0)
Monocytes Absolute: 0.6 10*3/uL (ref 0.1–1.0)
Monocytes Relative: 8 %
Neutro Abs: 5.5 10*3/uL (ref 1.7–7.7)
Neutrophils Relative %: 78 %
Platelets: 129 10*3/uL — ABNORMAL LOW (ref 150–400)
RBC: 4.03 MIL/uL (ref 3.87–5.11)
RDW: 12.5 % (ref 11.5–15.5)
WBC: 7.1 10*3/uL (ref 4.0–10.5)
nRBC: 0 % (ref 0.0–0.2)

## 2020-12-22 LAB — COMPREHENSIVE METABOLIC PANEL
ALT: 23 U/L (ref 0–44)
AST: 55 U/L — ABNORMAL HIGH (ref 15–41)
Albumin: 3.7 g/dL (ref 3.5–5.0)
Alkaline Phosphatase: 42 U/L (ref 38–126)
Anion gap: 16 — ABNORMAL HIGH (ref 5–15)
BUN: 31 mg/dL — ABNORMAL HIGH (ref 8–23)
CO2: 20 mmol/L — ABNORMAL LOW (ref 22–32)
Calcium: 9 mg/dL (ref 8.9–10.3)
Chloride: 94 mmol/L — ABNORMAL LOW (ref 98–111)
Creatinine, Ser: 1.52 mg/dL — ABNORMAL HIGH (ref 0.44–1.00)
GFR, Estimated: 34 mL/min — ABNORMAL LOW (ref >60)
Glucose, Bld: 113 mg/dL — ABNORMAL HIGH (ref 70–99)
Potassium: 4.5 mmol/L (ref 3.5–5.1)
Sodium: 130 mmol/L — ABNORMAL LOW (ref 135–145)
Total Bilirubin: 1.5 mg/dL — ABNORMAL HIGH (ref 0.3–1.2)
Total Protein: 7.3 g/dL (ref 6.5–8.1)

## 2020-12-22 LAB — CK: Total CK: 1045 U/L — ABNORMAL HIGH (ref 38–234)

## 2020-12-22 LAB — CBG MONITORING, ED: Glucose-Capillary: 115 mg/dL — ABNORMAL HIGH (ref 70–99)

## 2020-12-22 LAB — AMMONIA: Ammonia: 35 umol/L (ref 9–35)

## 2020-12-22 LAB — RESP PANEL BY RT-PCR (FLU A&B, COVID) ARPGX2
Influenza A by PCR: NEGATIVE
Influenza B by PCR: NEGATIVE
SARS Coronavirus 2 by RT PCR: NEGATIVE

## 2020-12-22 MED ORDER — ACETAMINOPHEN 325 MG PO TABS
650.0000 mg | ORAL_TABLET | Freq: Four times a day (QID) | ORAL | Status: DC | PRN
Start: 2020-12-22 — End: 2020-12-26

## 2020-12-22 MED ORDER — SODIUM CHLORIDE 0.9 % IV BOLUS
1000.0000 mL | Freq: Once | INTRAVENOUS | Status: AC
  Administered 2020-12-22: 1000 mL via INTRAVENOUS

## 2020-12-22 MED ORDER — ACETAMINOPHEN 650 MG RE SUPP: 650.0000 mg | Freq: Four times a day (QID) | RECTAL | Status: DC | PRN

## 2020-12-22 MED ORDER — LABETALOL HCL 5 MG/ML IV SOLN
10.0000 mg | INTRAVENOUS | Status: DC | PRN
  Administered 2020-12-23 – 2020-12-25 (×6): 10 mg via INTRAVENOUS
  Filled 2020-12-22 (×9): qty 4

## 2020-12-22 MED ORDER — LACTATED RINGERS IV SOLN: INTRAVENOUS | Status: DC

## 2020-12-22 NOTE — ED Notes (Signed)
LVM for POA [place] (grand daughter) advising that patient was headed up to her room and where she would be located.

## 2020-12-22 NOTE — ED Triage Notes (Signed)
Pt arrives via EMS from home. Family called for wellfare check. LKW 5/26 unknown time. EMS found pt on floor with medication scattered on floor. Pt baseline A/OX4 and ambulatory. Pt alert to self only. Hx of stroke. Notable bruising of right hip.

## 2020-12-22 NOTE — ED Notes (Signed)
Paula Haynes [phone] would like an update

## 2020-12-22 NOTE — H&P (Signed)
History and Physical    PLEASE NOTE THAT DRAGON DICTATION SOFTWARE WAS USED IN THE CONSTRUCTION OF THIS NOTE.   Paula Haynes YBW:[id number] DOB: 11-May-1936 DOA: 12/22/2020  PCP: Ma Hillock, DO Patient coming from: home   I have personally briefly reviewed patient's old medical records in [place]  Chief Complaint: Confusion  HPI: Paula Haynes is a 85 y.o. female with medical history significant for chronic diastolic heart failure, acquired hypothyroidism, essential hypertension, acute ischemic CVA in April 2022, stage IIIb chronic kidney disease with baseline creatinine 1.3-1.7, hyperlipidemia, who is admitted to [facility] on 12/22/2020 with suspected acute metabolic encephalopathy after presenting from home to [facility] ED via EMS for evaluation of confusion.   In the setting of the patient's presenting altered mental status, the following history was provided by the patient's son, in addition to my discussions with the emergency department physician and via chart review.  Son conveys that he last spoke with the patient on Thursday, 12/20/2020, at which time patient was at her baseline level of health, and without acute complaint at that time.  He did not subsequently speak or see the patient in detail unsuccessfully attempting to reach her via phone calls earlier today.  When he was unable to contact her via this method, he contacted emergency services, who were able to enter the patient's residence, at which time they found the patient to be on the floor, conscious, but confused, without overt evidence of trauma.  She was subsequently brought by EMS to [facility] emergency department for further evaluation of the above.  The patient is unconfirmed that the patient is altered relative to her baseline mental status.  No known prior recent trauma.   Per chart review, patient was recently hospitalized at [facility] from 11/15/2018 to 11/15/2020 for acute ischemic CVA, with no  reported residual acute focal deficit, following which she was discharged back to home.  Medical history notable for chronic diastolic heart failure, with most recent echocardiogram on 11/15/2020 showing LVEF 55 to 60%, no focal wall motion normalities, and moderate LVH, grade 1 diastolic dysfunction, trivial MR, mild aortic stenosis.  She also has a history of stage IIIb chronic kidney disease with baseline creatinine 1.3-1.7.      ED Course:  Vital signs in the ED were notable for the following: Temperature max 98.5, heart rate 7823; blood pressure 165/108 -186/88; respiratory rate 14-17, oxygen saturation 97 to 100% on room air.  Labs were notable for the following: CMP was notable for the following: Sodium 130 relative to most recent prior value 136 on 11/27/2020, chloride 94, bicarbonate 20, anion gap 16, creatinine 1.52 relative to most recent prior value of 1.66 on 11/27/2020, glucose 113,.  CPK 1045.  Urinalysis has been ordered, with result currently pending.  CBC notable for white blood cell count of 7100.  Nasopharyngeal COVID-19/influenza PCR were performed in the emergency department today and found to be negative.  Presenting EKG was associated with motion artifact limiting interpretation thereof, but it appears, within this confines, to demonstrate sinus rhythm with PACs, heart rate 92, nonspecific T wave inversion limited to lead III, no evidence of ST changes, including no evidence of ST elevation.  Chest x-ray showed no evidence of acute cardiopulmonary process.  CT head showed no evidence of acute intracranial process, including no evidence of intracranial hemorrhage or acute ischemic infarct.  CT cervical spine showed no acute cervical spinous abnormality.  On the basis of physical exam revealing  tenderness over the right hip, plain films of the right hip showed subtle cortical irregularity within the right superior pubic ramus near the junction with the symphysis, which could reflect a  minimally displaced fracture, with associated recommendation for follow-up CT scan is a potentially useful means of confirming this finding.  Ensuing CT pelvis without contrast, was associate with extensive patient motion, limiting associated evaluation, but within the limitations, showed no evidence of definitive displaced fracture.   While in the ED, the following were administered: Normal saline x1 L bolus.  Socially, the patient was admitted to the med telemetry floor for further evaluation management of suspected acute metabolic encephalopathy in the setting of suspected rhabdomyolysis.     Review of Systems: As per HPI otherwise 10 point review of systems negative.   Past Medical History:  Diagnosis Date  . Arthritis   . Chronic right-sided lumbar radiculopathy 10/17/2020  . History of blood product transfusion   . Hyperproteinemia 02/28/2019  . Hypertension   . Thyroid disease     Past Surgical History:  Procedure Laterality Date  . FINGER SURGERY     right middle finger/due to glass window cutting finger  . REFRACTIVE SURGERY     bil    Social History:  reports that she has never smoked. She has never used smokeless tobacco. She reports that she does not drink alcohol and does not use drugs.   No Known Allergies  Family History  Problem Relation Age of Onset  . Heart disease Father   . Arthritis Other        parent  . Hypertension Other        parent  . Stroke Other        parent    Family history reviewed and not pertinent    Prior to Admission medications   Medication Sig Start Date End Date Taking? Authorizing Provider  aspirin 81 MG tablet Take 1 tablet (81 mg total) by mouth at bedtime. Stop taking after 21days 11/15/20  Yes Florencia Reasons, MD  atorvastatin (LIPITOR) 40 MG tablet Take 1 tablet (40 mg total) by mouth daily. 11/27/20  Yes Kuneff, Renee A, DO  Cholecalciferol (VITAMIN D3) 5000 units CAPS Take 5,000 Units by mouth at bedtime.   Yes [provider]  clopidogrel (PLAVIX) 75 MG tablet Take 1 tablet (75 mg total) by mouth daily. 11/27/20  Yes Kuneff, Renee A, DO  denosumab (PROLIA) 60 MG/ML SOSY injection Inject into the skin every 6 (six) months. 11/06/17  Yes [provider]  lisinopril (ZESTRIL) 40 MG tablet Take 40 mg by mouth daily.   Yes [provider]  metoprolol succinate (TOPROL-XL) 50 MG 24 hr tablet Take 1 tablet (50 mg total) by mouth daily. Take with or immediately following a meal. 11/30/20 02/28/21 Yes Revankar, Reita Cliche, MD  Naphazoline-Pheniramine (OPCON-A) 0.027-0.315 % SOLN Place 1-2 drops into both eyes 4 (four) times daily as needed (for seasonal allergies).   Yes [provider]  Omega-3 1000 MG CAPS Take 1,000 mg by mouth daily.   Yes [provider]  sodium bicarbonate 650 MG tablet Take 1,300 mg by mouth 2 (two) times daily. 07/27/20  Yes [provider]  SYNTHROID 100 MCG tablet Take 100 mcg by mouth daily before breakfast. 08/28/20  Yes [provider]  Blood Pressure Monitoring (BLOOD PRESSURE KIT) DEVI 1 Device by Does not apply route daily. Large cuff 01/08/18   Kuneff, Renee A, DO     Objective  Physical Exam: Vitals:   12/22/20 1800 12/22/20 1900 12/22/20 2015 12/22/20 2145  BP: (!) 162/117 (!) 146/100 (!) 182/99 (!) 186/88  Pulse: 85 85 (!) 118 78  Resp: 17 15 (!) 21 14  Temp:      TempSrc:      SpO2: 95% 97% 99% 98%    General: appears to be stated age; alert, but not oriented to person, place, time, or self.  Specifically, she does not know in which hospital she currently resides, and only knows that her birthday occurs in [birth year], but unable to identify the specific date or year in which she was born.  Does not recall the name of the current [place] president.  Skin: warm, dry, no rash Head:  AT/[place] Mouth:  Oral mucosa membranes appear dry, normal dentition Neck: supple; trachea midline Heart:  RRR; did not appreciate any M/R/G Lungs: CTAB,  did not appreciate any wheezes, rales, or rhonchi Abdomen: + BS; soft, ND, NT Vascular: 2+ pedal pulses b/l; 2+ radial pulses b/l Extremities: no peripheral edema, no muscle wasting Neuro:  In the setting of the patient's current mental status and associated inability to follow instructions, unable to perform full neurologic exam at this time.  As such, assessment of strength, sensation, and cranial nerves is limited at this time. Patient noted to spontaneously move all 4 extremities. No tremors.     Labs on Admission: I have personally reviewed following labs and imaging studies  CBC: Recent Labs  Lab 12/22/20 1749  WBC 7.1  NEUTROABS 5.5  HGB 12.2  HCT 37.2  MCV 92.3  PLT 935*   Basic Metabolic Panel: Recent Labs  Lab 12/22/20 1749  NA 130*  K 4.5  CL 94*  CO2 20*  GLUCOSE 113*  BUN 31*  CREATININE 1.52*  CALCIUM 9.0   GFR: CrCl cannot be calculated (Unknown ideal weight.). Liver Function Tests: Recent Labs  Lab 12/22/20 1749  AST 55*  ALT 23  ALKPHOS 42  BILITOT 1.5*  PROT 7.3  ALBUMIN 3.7   No results for input(s): LIPASE, AMYLASE in the last 168 hours. Recent Labs  Lab 12/22/20 1749  AMMONIA 35   Coagulation Profile: No results for input(s): INR, PROTIME in the last 168 hours. Cardiac Enzymes: Recent Labs  Lab 12/22/20 1749  CKTOTAL 1,045*   BNP (last 3 results) No results for input(s): PROBNP in the last 8760 hours. HbA1C: No results for input(s): HGBA1C in the last 72 hours. CBG: Recent Labs  Lab 12/22/20 1814  GLUCAP 115*   Lipid Profile: No results for input(s): CHOL, HDL, LDLCALC, TRIG, CHOLHDL, LDLDIRECT in the last 72 hours. Thyroid Function Tests: No results for input(s): TSH, T4TOTAL, FREET4, T3FREE, THYROIDAB in the last 72 hours. Anemia Panel: No results for input(s): VITAMINB12, FOLATE, FERRITIN, TIBC, IRON, RETICCTPCT in the last 72 hours. Urine analysis:    Component Value Date/Time   COLORURINE YELLOW 11/14/2020 1254    APPEARANCEUR HAZY (A) 11/14/2020 1254   LABSPEC 1.009 11/14/2020 1254   PHURINE 6.0 11/14/2020 1254   GLUCOSEU NEGATIVE 11/14/2020 1254   HGBUR NEGATIVE 11/14/2020 1254   BILIRUBINUR NEGATIVE 11/14/2020 1254   KETONESUR NEGATIVE 11/14/2020 1254   PROTEINUR 30 (A) 11/14/2020 1254   NITRITE NEGATIVE 11/14/2020 1254   LEUKOCYTESUR TRACE (A) 11/14/2020 1254    Radiological Exams on Admission: CT Head Wo Contrast  Result Date: 12/22/2020 CLINICAL DATA:  Mental status change. EXAM: CT HEAD WITHOUT CONTRAST TECHNIQUE: Contiguous axial images were obtained from the base of  the skull through the vertex without intravenous contrast. COMPARISON:  November 15, 2020 FINDINGS: Brain: No evidence of acute infarction, hemorrhage, hydrocephalus, extra-axial collection or mass lesion/mass effect. Extensive small vessel ischemic changes throughout the deep white matter. This small right thalamic infarct seen by MRI dated April 2022 is not clearly visualized, likely obscured by microvascular changes. Vascular: Calcific atherosclerotic disease of the intra cavernous carotid arteries. Skull: Normal. Negative for fracture or focal lesion. Sinuses/Orbits: No acute finding. Other: None. IMPRESSION: 1. No acute intracranial abnormality. 2. Extensive small vessel ischemic changes throughout the deep white matter. 3. The small right thalamic infarct seen by MRI dated April 2022 is not clearly visualized, likely obscured by microvascular changes. Electronically Signed   By: Fidela Salisbury M.D.   On: 12/22/2020 19:24   CT Cervical Spine Wo Contrast  Result Date: 12/22/2020 CLINICAL DATA:  85 year old female with neck trauma. EXAM: CT CERVICAL SPINE WITHOUT CONTRAST TECHNIQUE: Multidetector CT imaging of the cervical spine was performed without intravenous contrast. Multiplanar CT image reconstructions were also generated. COMPARISON:  CT angiography dated 11/15/2020. FINDINGS: Alignment: No acute subluxation Skull base  and vertebrae: No acute fracture. Osteopenia. Soft tissues and spinal canal: No prevertebral fluid or swelling. No visible canal hematoma. Disc levels: Multilevel degenerative changes with disc space narrowing and endplate irregularity and spurring. Upper chest: Negative. Other: Bilateral carotid bulb calcified plaques. IMPRESSION: 1. No acute/traumatic cervical spine pathology. 2. Multilevel degenerative changes. Electronically Signed   By: Anner Crete M.D.   On: 12/22/2020 20:48   CT PELVIS WO CONTRAST  Result Date: 12/22/2020 CLINICAL DATA:  Pelvic trauma EXAM: CT PELVIS WITHOUT CONTRAST TECHNIQUE: Multidetector CT imaging of the pelvis was performed following the standard protocol without intravenous contrast. COMPARISON:  12/22/2020 FINDINGS: Evaluation is limited by patient motion throughout the exam. Urinary Tract:  No urinary tract calculi.  Bladder is unremarkable. Bowel: Diverticulosis of the distal colon without diverticulitis. No evidence of obstruction or ileus. Vascular/Lymphatic: Diffuse atherosclerosis. No pathologic adenopathy. Reproductive: Calcified uterine fibroid. The uterus is atrophic. No adnexal masses. Other:  No free fluid or free gas.  No abdominal wall hernia. Musculoskeletal: Patient motion results in significant artifact through the lower pelvis, particularly through the level of the right superior pubic ramus or possible fracture was seen on radiograph. No definite fractures are identified on the provided images. Prominent degenerative changes are seen within the lower lumbar spine. Reconstructed images demonstrate no additional findings. IMPRESSION: 1. Extensive patient motion through the lower pelvis limits evaluation. Specifically, there is extensive motion artifact through the level of the right superior pubic ramus where a possible abnormality was seen on radiograph. 2. No definite displaced fractures are identified. However, if fracture remains a concern the patient  could be brought back for repeat imaging through the lower pelvis and addendum to this report provided. 3. Distal colonic diverticulosis without diverticulitis. 4.  Aortic Atherosclerosis (ICD10-I70.0). Electronically Signed   By: Randa Ngo M.D.   On: 12/22/2020 20:50   DG Hip Unilat With Pelvis 2-3 Views Right  Result Date: 12/22/2020 CLINICAL DATA:  Weakness, altered level of consciousness EXAM: DG HIP (WITH OR WITHOUT PELVIS) 2-3V RIGHT COMPARISON:  None. FINDINGS: Frontal view of the pelvis as well as frontal and frogleg lateral views of the right hip are obtained. There is subtle cortical irregularity within the right superior pubic ramus near the junction with the symphysis, which could reflect a minimally displaced fracture. If pain is referable to this region, CT scan may be useful  for confirmation. No other acute bony abnormalities are identified. Alignment is anatomic. Mild symmetrical bilateral hip osteoarthritis. Prominent lower lumbar spondylosis and facet hypertrophy. Calcification left hemipelvis most consistent with degenerating fibroid. IMPRESSION: 1. Findings suspicious for a minimally displaced right superior pubic ramus fracture. Follow-up CT scan may be useful for confirmation. 2. Mild symmetrical bilateral hip osteoarthritis. Electronically Signed   By: Randa Ngo M.D.   On: 12/22/2020 19:06     EKG: Independently reviewed, with result as described above.    Assessment/Plan   Paula Haynes is a 85 y.o. female with medical history significant for chronic diastolic heart failure, acquired hypothyroidism, essential hypertension, acute ischemic CVA in April 2022, stage IIIb chronic kidney disease with baseline creatinine 1.3-1.7, hyperlipidemia, who is admitted to [facility] on 12/22/2020 with suspected acute metabolic encephalopathy after presenting from home to [facility] ED via EMS for evaluation of confusion.    Principal Problem:   Acute encephalopathy Active  Problems:   Essential hypertension   CKD (chronic kidney disease) stage 3, GFR 30-59 ml/min (HCC)   Rhabdomyolysis   Acute hyponatremia   Right hip pain   Chronic diastolic CHF (congestive heart failure) ([place])    #) Acute encephalopathy: After being found down on the floor of her house, for unknown duration and associated with last known normal: Thursday, 12/20/2020, equating to the improvement 48 hours ago, the patient is noted to be confused relative to her baseline status as confirmed by the patient's son.  Suspect at least a portion of her acute encephalopathy is metabolic in etiology in the setting of suspected rhabdomyolysis as a result of prolonged downtime, as further described below.  No evidence of underlying infectious process at this time, including negative COVID-19/influenza PCR, while presenting chest x-ray shows no evidence of acute cardiopulmonary process, including no evidence of pneumonia.  Of note, urinalysis has been ordered, with result currently pending.  While presentation appears consistent with acute metabolic encephalopathy in the setting of rhabdomyolysis, differential also includes acute ischemic stroke, which at this time appears slightly less likely given that the patient is noted to be spontaneously moving all 4 extremities.  However, should patient's mental status not improve with measures directed rhabdomyolysis, could consider pursuing MRI of the brain, while noting that the patient is already out of the window for tPA administration or thrombectomy.  Seizures felt to be less likely at this time.  Of note, the patient has been kept n.p.o. until she passed her nursing bedside swallow screen, which has now been confirmed.    Plan: Further evaluation and management of suspected rhabdomyolysis, including IV fluids, as further described below.  Follow-up result urinalysis.  CMP in the morning. Repeat CBC in the morning. check VBG to evaluate for any contribution from  hypercapnic encephalopathy. Check TSH. check B12, urinary drug screen, ammonia level, MMA.  Consider pursuing MRI of the brain, as further detailed above.  Fall precautions ordered.      #) Rhabdomyolysis: Suspected diagnosis on the basis of elevated CPK level in the context of prolonged downtime for up to the last 48 hours leading up to presentation in the ED today, as further detailed above.  Unable to assess patient's strength in the setting of concomitant acute encephalopathy preventing her from being able to follow instructions at this time.  Additionally, urinalysis been ordered, but with result currently pending, including for evaluation of suggestion of myoglobinuria. The patient is also on high intensity atorvastatin as an outpatient, which will be held  for now in the setting of suspected rhabdomyolysis.  Will reduce rate of IV fluids relative to that which would be more typical for treatment of rhabdomyolysis given the patient's documented history of chronic diastolic heart failure.  Plan: Lactated Ringer's at 125 cc/h, representing a reduction relative to more aggressive IV fluid process, with rationale detailed above.  Check serum magnesium level as well as her phosphorus level.  Repeat CMP in the morning.  Repeat CPK ordered in the morning as well.  Follow for result urinalysis, including pretension for the presence of myoglobinuria.  Hold home statin.      #) Acute hypo-osmolar hyponatremia:  Presenting serum sodium noted to be 130 relative to most recent prior value 136 on 11/28/2018. Suspect an element of hypovolumeia, with suspected contribution from dehydration in the setting of the patient experiencing very little oral intake over the last 1 to 2 days, as she was found down at her residence for prolonged period of time, as further detailed above, with clinical evidence to suggest dry oral mucous membranes.  Differential also includes possibility of contribution from SIADH.  In general,  will provide IV fluids due to suspected contributory dehydration as well as for management of suspected presenting acute rhabdomyolysis, with further evaluation for any additional contributing factors, including SIADH, as further detailed below.  We will also check TSH, particularly in the context of a documented history of acquired hypothyroidism.  Otherwise, no overt pharmacologic contributions.    Plan: monitor strict I's and O's and daily weights.  check UA, random urine sodium, urine osmolality.  Check serum osmolality to confirm suspected hypoosmolar etiology.  Repeat BMP in the morning. Check TSH. Check serum uric acid level, as SIADH can be associated with hypouricemia due to hyperuricuria.  Continuous IV fluids, as further described above.        #) Increased anion gap metabolic acidosis: Suspected be on the basis of rhabdomyolysis, as further detailed above.  We will also check salicylate level as well as INR to evaluate hepatic synthetic function.  No evidence to suggest underlying infectious process or sepsis.  Normal  Plan: Further work-up and management of suspected presenting rhabdomyolysis, including continuous IV fluids, as further described above.  Check INR as well as salicylate level.  Repeat CMP in the morning.       #) Right hip pain: Physical exam reveals right hip pain.  Insinuating additional evaluation included CT pelvis without contrast, which demonstrated no definitive displaced fracture, but with the caveat that evaluation of the study was limited by the presence of patient motion artifact.   Plan: As needed acetaminophen.  Could consider pursuing repeat CT pelvis depending upon subsequent clinical course of physical exam current.      #) Acquired hypothyroidism: Documented history of such, on Synthroid as an outpatient.  Plan: Continue on Synthroid.  Check TSH, particular in setting of presenting with acute hyponatremia, as further detailed above.     #)  Stage IIIb chronic kidney disease: Associated with baseline creatinine range 1.3-1.7, with presenting serum creatinine noted to be within this baseline range.  Plan: Strict I's and O's Daily weights.  Repeat BMP in the morning.      #) Hyperlipidemia: On high intensity atorvastatin as an outpatient.  In the setting of presenting elevated CPK, will hold home statin for now.  Plan: Hold home statin for now, as above.     #) Chronic diastolic heart failure: Documented history of such, most recent echocardiogram occurring in April 2022,  with results as further described above, including evidence of grade 1 diastolic dysfunction.  No clinical or radiographic evidence at this time to suggest acutely decompensated heart failure.  She is not on any scheduled diuretic medications at home.  Plan: Monitor strict I's and O's and daily weights.  Add on serum magnesium level.  Repeat BMP in the morning.      DVT prophylaxis: scd  Code Status: Full code Disposition Plan: Per Rounding Team Consults called: none  Admission status: Inpatient; med telemetry     Of note, this patient was added by me to the following Admit List/Treatment Team: [email or web address].      PLEASE NOTE THAT DRAGON DICTATION SOFTWARE WAS USED IN THE CONSTRUCTION OF THIS NOTE.   [facility] Pager [phone] From [place]  Otherwise, please contact night-coverage  [email or web address] Password [facility]   12/22/2020, 9:57 PM

## 2020-12-22 NOTE — ED Notes (Signed)
Contact Son Sunday Corn at contact in chart with status changes, admission etc.

## 2020-12-22 NOTE — ED Provider Notes (Signed)
[facility] Provider Note   CSN: [id number] Arrival date & time: 12/22/20  1733     History Chief Complaint  Patient presents with  . Altered Mental Status    Paula Haynes is a 85 y.o. female.  Level 5 caveat secondary to altered mental status.  She was admitted last month with left facial numbness and found to have a stroke by MRI.  She has been living at home independently since then.  Last known well was 2 days ago.  Family called EMS for a wellness check.  EMS found her on the floor awake but unable to carry on conversation or state what happened.  She denies any pain.  Normally alert and oriented x4 and can manage her self independently  The history is provided by the patient and the EMS personnel. The history is limited by the condition of the patient.  Altered Mental Status Presenting symptoms: confusion and disorientation   Severity:  Unable to specify Most recent episode:  2 days ago Episode history:  Unable to specify Progression:  Unchanged Chronicity:  New Associated symptoms: abdominal pain, difficulty breathing, headaches and vomiting        Past Medical History:  Diagnosis Date  . Arthritis   . Chronic right-sided lumbar radiculopathy 10/17/2020  . History of blood product transfusion   . Hyperproteinemia 02/28/2019  . Hypertension   . Thyroid disease     Patient Active Problem List   Diagnosis Date Noted  . History of stroke 11/30/2020  . Mild aortic stenosis 11/30/2020  . Hyperlipidemia LDL goal <70 11/29/2020  . Acute CVA (cerebrovascular accident) ([place]) 11/14/2020  . Vitamin D deficiency 09/26/2020  . Postoperative hypothyroidism 09/26/2020  . Elevated immunoglobulin A & G 03/29/2019  . Thrombocytopenia ([place]) 07/26/2018  . Lymphocytopenia 07/26/2018  . Anemia, chronic disease 07/23/2018  . Osteoporosis 11/16/2017  . CKD (chronic kidney disease) stage 3, GFR 30-59 ml/min ([facility]) 03/19/2016  . Varicose veins of  lower extremities with other complications 97/41/6384  . Essential hypertension 10/09/2008    Past Surgical History:  Procedure Laterality Date  . FINGER SURGERY     right middle finger/due to glass window cutting finger  . REFRACTIVE SURGERY     bil     OB History    Gravida  3   Para  3   Term  3   Preterm      AB      Living  3     SAB      IAB      Ectopic      Multiple      Live Births              Family History  Problem Relation Age of Onset  . Heart disease Father   . Arthritis Other        parent  . Hypertension Other        parent  . Stroke Other        parent    Social History   Tobacco Use  . Smoking status: Never Smoker  . Smokeless tobacco: Never Used  Vaping Use  . Vaping Use: Never used  Substance Use Topics  . Alcohol use: No    Alcohol/week: 0.0 standard drinks  . Drug use: No    Home Medications Prior to Admission medications   Medication Sig Start Date End Date Taking? Authorizing Provider  aspirin 81 MG tablet Take 1 tablet (81 mg  total) by mouth at bedtime. Stop taking after 21days 11/15/20   Florencia Reasons, MD  atorvastatin (LIPITOR) 40 MG tablet Take 1 tablet (40 mg total) by mouth daily. 11/27/20   Kuneff, Renee A, DO  Blood Pressure Monitoring (BLOOD PRESSURE KIT) DEVI 1 Device by Does not apply route daily. Large cuff 01/08/18   Kuneff, Renee A, DO  Cholecalciferol (VITAMIN D3) 5000 units CAPS Take 5,000 Units by mouth at bedtime.    [provider]  clopidogrel (PLAVIX) 75 MG tablet Take 1 tablet (75 mg total) by mouth daily. 11/27/20   Kuneff, Renee A, DO  denosumab (PROLIA) 60 MG/ML SOSY injection Inject into the skin every 6 (six) months. 11/06/17   [provider]  lisinopril (ZESTRIL) 40 MG tablet Take 40 mg by mouth daily.    [provider]  metoprolol succinate (TOPROL-XL) 50 MG 24 hr tablet Take 1 tablet (50 mg total) by mouth daily. Take with or immediately following a meal. 11/30/20 02/28/21   Revankar, Reita Cliche, MD  Naphazoline-Pheniramine (OPCON-A) 0.027-0.315 % SOLN Place 1-2 drops into both eyes 4 (four) times daily as needed (for seasonal allergies).    [provider]  Omega-3 1000 MG CAPS Take 1,000 mg by mouth daily.    [provider]  sodium bicarbonate 650 MG tablet Take 1,300 mg by mouth 2 (two) times daily. 07/27/20   [provider]  SYNTHROID 100 MCG tablet Take 100 mcg by mouth daily before breakfast. 08/28/20   [provider]    Allergies    Patient has no known allergies.  Review of Systems   Review of Systems  Unable to perform ROS: Mental status change  Gastrointestinal: Positive for abdominal pain and vomiting.  Neurological: Positive for headaches.  Psychiatric/Behavioral: Positive for confusion.    Physical Exam Updated Vital Signs BP (!) 165/108 (BP Location: Right Arm)   Pulse (!) 52   Temp 98.3 F (36.8 C) (Oral)   Resp 16   SpO2 100%   Physical Exam Vitals and nursing note reviewed.  Constitutional:      General: She is not in acute distress.    Appearance: Normal appearance. She is well-developed.  HENT:     Head: Normocephalic and atraumatic.     Mouth/Throat:     Mouth: Mucous membranes are dry.  Eyes:     Conjunctiva/sclera: Conjunctivae normal.  Cardiovascular:     Rate and Rhythm: Normal rate and regular rhythm.     Heart sounds: No murmur heard.   Pulmonary:     Effort: Pulmonary effort is normal. No respiratory distress.     Breath sounds: Normal breath sounds.  Abdominal:     Palpations: Abdomen is soft.     Tenderness: There is no abdominal tenderness. There is no guarding or rebound.  Musculoskeletal:        General: Signs of injury present. No tenderness. Normal range of motion.     Cervical back: Neck supple.     Comments: She is some bruising over her right lateral hip.  No shortening or rotation.  Skin:    General: Skin is warm and dry.  Neurological:     General: No focal  deficit present.     Mental Status: She is alert. She is disoriented.     Comments: She is awake and alert.  She will follow commands.  No obvious facial droop.  She is oriented to self.  She is somewhat repetitive in some of her speaking.  ED Results / Procedures / Treatments   Labs (all labs ordered are listed, but only abnormal results are displayed) Labs Reviewed  CBC WITH DIFFERENTIAL/PLATELET - Abnormal; Notable for the following components:      Result Value   Platelets 129 (*)    All other components within normal limits  COMPREHENSIVE METABOLIC PANEL - Abnormal; Notable for the following components:   Sodium 130 (*)    Chloride 94 (*)    CO2 20 (*)    Glucose, Bld 113 (*)    BUN 31 (*)    Creatinine, Ser 1.52 (*)    AST 55 (*)    Total Bilirubin 1.5 (*)    GFR, Estimated 34 (*)    Anion gap 16 (*)    All other components within normal limits  CK - Abnormal; Notable for the following components:   Total CK 1,045 (*)    All other components within normal limits  CBG MONITORING, ED - Abnormal; Notable for the following components:   Glucose-Capillary 115 (*)    All other components within normal limits  RESP PANEL BY RT-PCR (FLU A&B, COVID) ARPGX2  AMMONIA  URINALYSIS, ROUTINE W REFLEX MICROSCOPIC  MAGNESIUM    EKG EKG Interpretation  Date/Time:  Saturday Dec 22 2020 17:47:21 EDT Ventricular Rate:  92 PR Interval:  170 QRS Duration: 90 QT Interval:  389 QTC Calculation: 482 R Axis:   -3 Text Interpretation: Sinus tachycardia Atrial premature complexes Left ventricular hypertrophy Borderline T abnormalities, inferior leads No significant change since 4/22 Confirmed by Aletta Edouard [phone]) on 12/22/2020 5:56:29 PM   Radiology CT Head Wo Contrast  Result Date: 12/22/2020 CLINICAL DATA:  Mental status change. EXAM: CT HEAD WITHOUT CONTRAST TECHNIQUE: Contiguous axial images were obtained from the base of the skull through the vertex without intravenous  contrast. COMPARISON:  November 15, 2020 FINDINGS: Brain: No evidence of acute infarction, hemorrhage, hydrocephalus, extra-axial collection or mass lesion/mass effect. Extensive small vessel ischemic changes throughout the deep white matter. This small right thalamic infarct seen by MRI dated April 2022 is not clearly visualized, likely obscured by microvascular changes. Vascular: Calcific atherosclerotic disease of the intra cavernous carotid arteries. Skull: Normal. Negative for fracture or focal lesion. Sinuses/Orbits: No acute finding. Other: None. IMPRESSION: 1. No acute intracranial abnormality. 2. Extensive small vessel ischemic changes throughout the deep white matter. 3. The small right thalamic infarct seen by MRI dated April 2022 is not clearly visualized, likely obscured by microvascular changes. Electronically Signed   By: Fidela Salisbury M.D.   On: 12/22/2020 19:24   CT Cervical Spine Wo Contrast  Result Date: 12/22/2020 CLINICAL DATA:  85 year old female with neck trauma. EXAM: CT CERVICAL SPINE WITHOUT CONTRAST TECHNIQUE: Multidetector CT imaging of the cervical spine was performed without intravenous contrast. Multiplanar CT image reconstructions were also generated. COMPARISON:  CT angiography dated 11/15/2020. FINDINGS: Alignment: No acute subluxation Skull base and vertebrae: No acute fracture. Osteopenia. Soft tissues and spinal canal: No prevertebral fluid or swelling. No visible canal hematoma. Disc levels: Multilevel degenerative changes with disc space narrowing and endplate irregularity and spurring. Upper chest: Negative. Other: Bilateral carotid bulb calcified plaques. IMPRESSION: 1. No acute/traumatic cervical spine pathology. 2. Multilevel degenerative changes. Electronically Signed   By: Anner Crete M.D.   On: 12/22/2020 20:48   CT PELVIS WO CONTRAST  Result Date: 12/22/2020 CLINICAL DATA:  Pelvic trauma EXAM: CT PELVIS WITHOUT CONTRAST TECHNIQUE: Multidetector CT  imaging of the pelvis was performed following the standard protocol  without intravenous contrast. COMPARISON:  12/22/2020 FINDINGS: Evaluation is limited by patient motion throughout the exam. Urinary Tract:  No urinary tract calculi.  Bladder is unremarkable. Bowel: Diverticulosis of the distal colon without diverticulitis. No evidence of obstruction or ileus. Vascular/Lymphatic: Diffuse atherosclerosis. No pathologic adenopathy. Reproductive: Calcified uterine fibroid. The uterus is atrophic. No adnexal masses. Other:  No free fluid or free gas.  No abdominal wall hernia. Musculoskeletal: Patient motion results in significant artifact through the lower pelvis, particularly through the level of the right superior pubic ramus or possible fracture was seen on radiograph. No definite fractures are identified on the provided images. Prominent degenerative changes are seen within the lower lumbar spine. Reconstructed images demonstrate no additional findings. IMPRESSION: 1. Extensive patient motion through the lower pelvis limits evaluation. Specifically, there is extensive motion artifact through the level of the right superior pubic ramus where a possible abnormality was seen on radiograph. 2. No definite displaced fractures are identified. However, if fracture remains a concern the patient could be brought back for repeat imaging through the lower pelvis and addendum to this report provided. 3. Distal colonic diverticulosis without diverticulitis. 4.  Aortic Atherosclerosis (ICD10-I70.0). Electronically Signed   By: Randa Ngo M.D.   On: 12/22/2020 20:50   DG Chest Port 1 View  Result Date: 12/22/2020 CLINICAL DATA:  85 year old female with chest pain. EXAM: PORTABLE CHEST 1 VIEW COMPARISON:  None. FINDINGS: No focal consolidation, pleural effusion or pneumothorax. The cardiac silhouette is within limits. Atherosclerotic calcification of the aorta. No acute osseous pathology. Degenerative changes of the  spine. IMPRESSION: No active disease. Electronically Signed   By: Anner Crete M.D.   On: 12/22/2020 22:20   DG Hip Unilat With Pelvis 2-3 Views Right  Result Date: 12/22/2020 CLINICAL DATA:  Weakness, altered level of consciousness EXAM: DG HIP (WITH OR WITHOUT PELVIS) 2-3V RIGHT COMPARISON:  None. FINDINGS: Frontal view of the pelvis as well as frontal and frogleg lateral views of the right hip are obtained. There is subtle cortical irregularity within the right superior pubic ramus near the junction with the symphysis, which could reflect a minimally displaced fracture. If pain is referable to this region, CT scan may be useful for confirmation. No other acute bony abnormalities are identified. Alignment is anatomic. Mild symmetrical bilateral hip osteoarthritis. Prominent lower lumbar spondylosis and facet hypertrophy. Calcification left hemipelvis most consistent with degenerating fibroid. IMPRESSION: 1. Findings suspicious for a minimally displaced right superior pubic ramus fracture. Follow-up CT scan may be useful for confirmation. 2. Mild symmetrical bilateral hip osteoarthritis. Electronically Signed   By: Randa Ngo M.D.   On: 12/22/2020 19:06    Procedures Procedures   Medications Ordered in ED Medications  labetalol (NORMODYNE) injection 10 mg (has no administration in time range)  lactated ringers infusion ( Intravenous New Bag/Given 12/22/20 2134)  sodium chloride 0.9 % bolus 1,000 mL (1,000 mLs Intravenous New Bag/Given 12/22/20 2135)    ED Course  I have reviewed the triage vital signs and the nursing notes.  Pertinent labs & imaging results that were available during my care of the patient were reviewed by me and considered in my medical decision making (see chart for details).  Clinical Course as of 12/22/20 2223  Sat Dec 22, 2020  2003 Patient's son is here and he confirms that she is not at her baseline.  He said she seems more confused. [MB]  2033 Patient's care  is signed out to [place] to follow-up on results of  CT imaging of cervical spine and pelvis.  Will need admission to the hospital for further evaluation of her altered mental status and treatment of her rhabdomyolysis. [MB]  2111 Discussed with [facility] hospitalist who will evaluate the patient for admission. [MB]    Clinical Course User Index [MB] Hayden Rasmussen, MD   MDM Rules/Calculators/A&P                         This patient complains of altered mental status confusion fall possible right hip injury; this involves an extensive number of treatment Options and is a complaint that carries with it a high risk of complications and Morbidity. The differential includes dehydration, rhabdo, bleed, stroke, pneumonia, COVID, metabolic derangement  I ordered, reviewed and interpreted labs, which included CBC with normal white count normal hemoglobin, platelets slightly low seen before, chemistries mildly deranged with low sodium low bicarb low chloride elevated BUN and creatinine, LFTs also mildly elevated with AST of 55 and T bili 1.5, CK elevated at 1045, ammonia normal, urinalysis pending I ordered medication IV fluids I ordered imaging studies which included chest x-ray and right hip and pelvis x-ray, CT head and cervical spine, CT pelvis and I independently    visualized and interpreted imaging which showed no acute findings.  Suspicion for right superior pelvic rami fracture not seen on CT although poor quality.  May need further imaging. Additional history obtained from EMS and patient's son Previous records obtained and reviewed in epic including recent admission for stroke I consulted [facility] hospitalist Dr. Velia Meyer and discussed lab and imaging findings  Critical Interventions: None  After the interventions stated above, I reevaluated the patient and found patient still not to be back to baseline mental status.  She will need a bit of the hospital for further work-up.   Final  Clinical Impression(s) / ED Diagnoses Final diagnoses:  Confusion  Traumatic rhabdomyolysis, initial encounter [facility])    Rx / DC Orders ED Discharge Orders    None       Hayden Rasmussen, MD 12/22/20 2227

## 2020-12-23 DIAGNOSIS — E871 Hypo-osmolality and hyponatremia: Secondary | ICD-10-CM | POA: Diagnosis present

## 2020-12-23 DIAGNOSIS — I5032 Chronic diastolic (congestive) heart failure: Secondary | ICD-10-CM | POA: Diagnosis present

## 2020-12-23 DIAGNOSIS — M25551 Pain in right hip: Secondary | ICD-10-CM | POA: Diagnosis present

## 2020-12-23 DIAGNOSIS — M6282 Rhabdomyolysis: Secondary | ICD-10-CM | POA: Diagnosis present

## 2020-12-23 LAB — COMPREHENSIVE METABOLIC PANEL
ALT: 21 U/L (ref 0–44)
AST: 50 U/L — ABNORMAL HIGH (ref 15–41)
Albumin: 3.5 g/dL (ref 3.5–5.0)
Alkaline Phosphatase: 38 U/L (ref 38–126)
Anion gap: 13 (ref 5–15)
BUN: 28 mg/dL — ABNORMAL HIGH (ref 8–23)
CO2: 21 mmol/L — ABNORMAL LOW (ref 22–32)
Calcium: 8.5 mg/dL — ABNORMAL LOW (ref 8.9–10.3)
Chloride: 98 mmol/L (ref 98–111)
Creatinine, Ser: 1.38 mg/dL — ABNORMAL HIGH (ref 0.44–1.00)
GFR, Estimated: 38 mL/min — ABNORMAL LOW (ref >60)
Glucose, Bld: 111 mg/dL — ABNORMAL HIGH (ref 70–99)
Potassium: 3.4 mmol/L — ABNORMAL LOW (ref 3.5–5.1)
Sodium: 132 mmol/L — ABNORMAL LOW (ref 135–145)
Total Bilirubin: 1.1 mg/dL (ref 0.3–1.2)
Total Protein: 7.1 g/dL (ref 6.5–8.1)

## 2020-12-23 LAB — URINALYSIS, ROUTINE W REFLEX MICROSCOPIC
Bilirubin Urine: NEGATIVE
Glucose, UA: NEGATIVE mg/dL
Ketones, ur: NEGATIVE mg/dL
Nitrite: NEGATIVE
Protein, ur: >=300 mg/dL — AB
Specific Gravity, Urine: 1.012 (ref 1.005–1.030)
WBC, UA: >50 WBC/hpf — ABNORMAL HIGH (ref 0–5)
pH: 6 (ref 5.0–8.0)

## 2020-12-23 LAB — BLOOD GAS, VENOUS
Acid-base deficit: 0.7 mmol/L (ref 0.0–2.0)
Bicarbonate: 23.5 mmol/L (ref 20.0–28.0)
Drawn by: 1447
O2 Saturation: 57.4 %
Patient temperature: 37
pCO2, Ven: 38.9 mmHg — ABNORMAL LOW (ref 44.0–60.0)
pH, Ven: 7.398 (ref 7.250–7.430)
pO2, Ven: 31.7 mmHg — CL (ref 32.0–45.0)

## 2020-12-23 LAB — CK: Total CK: 931 U/L — ABNORMAL HIGH (ref 38–234)

## 2020-12-23 LAB — CBC
HCT: 34.6 % — ABNORMAL LOW (ref 36.0–46.0)
Hemoglobin: 11.6 g/dL — ABNORMAL LOW (ref 12.0–15.0)
MCH: 30.1 pg (ref 26.0–34.0)
MCHC: 33.5 g/dL (ref 30.0–36.0)
MCV: 89.9 fL (ref 80.0–100.0)
Platelets: 304 10*3/uL (ref 150–400)
RBC: 3.85 MIL/uL — ABNORMAL LOW (ref 3.87–5.11)
RDW: 12.6 % (ref 11.5–15.5)
WBC: 7.6 10*3/uL (ref 4.0–10.5)
nRBC: 0 % (ref 0.0–0.2)

## 2020-12-23 LAB — URIC ACID: Uric Acid, Serum: 7.4 mg/dL — ABNORMAL HIGH (ref 2.5–7.1)

## 2020-12-23 LAB — RAPID URINE DRUG SCREEN, HOSP PERFORMED
Amphetamines: NOT DETECTED
Barbiturates: NOT DETECTED
Benzodiazepines: NOT DETECTED
Cocaine: NOT DETECTED
Opiates: NOT DETECTED
Tetrahydrocannabinol: NOT DETECTED

## 2020-12-23 LAB — PROTIME-INR
INR: 1.2 (ref 0.8–1.2)
Prothrombin Time: 15.1 seconds (ref 11.4–15.2)

## 2020-12-23 LAB — OSMOLALITY: Osmolality: 290 mOsm/kg (ref 275–295)

## 2020-12-23 LAB — SALICYLATE LEVEL: Salicylate Lvl: <7 mg/dL — ABNORMAL LOW (ref 7.0–30.0)

## 2020-12-23 LAB — OSMOLALITY, URINE: Osmolality, Ur: 427 mOsm/kg (ref 300–900)

## 2020-12-23 LAB — TSH: TSH: 11.79 u[IU]/mL — ABNORMAL HIGH (ref 0.350–4.500)

## 2020-12-23 LAB — PHOSPHORUS: Phosphorus: 3.5 mg/dL (ref 2.5–4.6)

## 2020-12-23 LAB — SODIUM, URINE, RANDOM: Sodium, Ur: 65 mmol/L

## 2020-12-23 LAB — AMMONIA: Ammonia: 20 umol/L (ref 9–35)

## 2020-12-23 LAB — MAGNESIUM: Magnesium: 1.7 mg/dL (ref 1.7–2.4)

## 2020-12-23 MED ORDER — LISINOPRIL 40 MG PO TABS
40.0000 mg | ORAL_TABLET | Freq: Every day | ORAL | Status: DC
  Administered 2020-12-23 – 2020-12-26 (×4): 40 mg via ORAL
  Filled 2020-12-23 (×4): qty 1

## 2020-12-23 MED ORDER — METOPROLOL SUCCINATE ER 50 MG PO TB24
50.0000 mg | ORAL_TABLET | Freq: Every day | ORAL | Status: DC
  Administered 2020-12-23 – 2020-12-26 (×4): 50 mg via ORAL
  Filled 2020-12-23 (×4): qty 1

## 2020-12-23 MED ORDER — LEVOTHYROXINE SODIUM 100 MCG PO TABS
100.0000 ug | ORAL_TABLET | Freq: Every day | ORAL | Status: DC
  Administered 2020-12-24 – 2020-12-26 (×3): 100 ug via ORAL
  Filled 2020-12-23 (×3): qty 1

## 2020-12-23 MED ORDER — SODIUM CHLORIDE 0.9 % IV SOLN
1.0000 g | INTRAVENOUS | Status: DC
  Administered 2020-12-23 – 2020-12-26 (×4): 1 g via INTRAVENOUS
  Filled 2020-12-23 (×3): qty 10
  Filled 2020-12-23 (×3): qty 1

## 2020-12-23 MED ORDER — CLOPIDOGREL BISULFATE 75 MG PO TABS
75.0000 mg | ORAL_TABLET | Freq: Every day | ORAL | Status: DC
  Administered 2020-12-23 – 2020-12-26 (×4): 75 mg via ORAL
  Filled 2020-12-23 (×4): qty 1

## 2020-12-23 MED ORDER — ATORVASTATIN CALCIUM 40 MG PO TABS
40.0000 mg | ORAL_TABLET | Freq: Every day | ORAL | Status: DC
  Administered 2020-12-23 – 2020-12-26 (×4): 40 mg via ORAL
  Filled 2020-12-23 (×4): qty 1

## 2020-12-23 MED ORDER — HEPARIN SODIUM (PORCINE) 5000 UNIT/ML IJ SOLN
5000.0000 [IU] | Freq: Three times a day (TID) | INTRAMUSCULAR | Status: DC
  Administered 2020-12-23 – 2020-12-26 (×9): 5000 [IU] via SUBCUTANEOUS
  Filled 2020-12-23 (×9): qty 1

## 2020-12-23 NOTE — Progress Notes (Signed)
PROGRESS NOTE  Paula Haynes  PZW:[id number] DOB: 1936/02/13 DOA: 12/22/2020 PCP: Ma Hillock, DO   Brief Narrative: Paula Haynes is an 85 y.o. female with a history of CVA April 2022, HFpEF, HTN, stage IIIb CKD, HLD, hypothyroidism who presented to the ED 5/28 after patient was found down, confused, at home for uncertain period of time. In the ED she was confused, confirmed to be off mental baseline, afebrile with CT head and cervical spine negative for acute abnormalities, hyponatremia (Na 130), CK 1,045, UA strongly suggestive of UTI for which ceftriaxone has been started along with IV fluids for rhabdomyolysis. PT evaluation is pending.    Assessment & Plan: Principal Problem:   Acute encephalopathy Active Problems:   Essential hypertension   CKD (chronic kidney disease) stage 3, GFR 30-59 ml/min (HCC)   Rhabdomyolysis   Acute hyponatremia   Right hip pain   Chronic diastolic CHF (congestive heart failure) (HCC)  UTI:  - Ceftriaxone 1g IV q24h - Urine culture pending  Rhabdomyolysis:  - Will continue IVF for now. Recheck CK in AM.  - Found down: PT and OT evaluation requested. To facilitate mobility, will DC cardiac monitoring. Telemetry personally reviewed today showing only NSR.    History of CVA: April 2022.  - Has complete 3 weeks of DAPT, will be restarted on plavix.  - Statin  Chronic HFpEF, HTN: Echo 11/15/2020 LVEF 55-60%, G1DD, no WMA, mild AS.   - Continue lisinopril, metoprolol  Stage IIIb CKD: Stable, may be progressing to stage IV.  - Avoid nephrotoxins  Right superior pubic ramus cortical irregularity: On XR, not seen on motion-degraded CT. Pt denies pain currently.  - Defer further work up to after patient is recovered and more able to contribute to history and exam.   Hypothyroidism: TSH 11.790. Pt's granddaughter/POA suspects the patient takes all prescribed medications reliably prior to this episode.  - Check free T4, though will likely continue  current dose of synthroid as there is not alternative evidence of hypothyroidism.   Hypokalemia:  - Supplement and monitor  Thrombocytopenia:  - Resolved spontaneously. Will monitor again in AM.   AST elevation: Due to rhabdo most likely.  - Continue monitoring.   HLD:  - Continue statin as above  DVT prophylaxis: Heparin Code Status: Full Family Communication: Granddaughter who is HCPOA by phone. Disposition Plan:  Status is: Inpatient  Remains inpatient appropriate because:Altered mental status and IV treatments appropriate due to intensity of illness or inability to take PO   Dispo: The patient is from: Home              Anticipated d/c is to: SNF vs. home              Patient currently is not medically stable to d/c.   Difficult to place patient No  Consultants:   None  Procedures:   None  Antimicrobials:  Ceftriaxone   Subjective: Recursively stating room 15  and 2 [address] (reading from board in front of bed that lists where she is), also says "pain p.a.i.n." when asked for her name (this is not the correct spelling of her last name). States she's wet. Ate all of breakfast. Granddaughter says she is normally completely oriented, having hours long conversations without impairment at recent baseline.  Objective: Vitals:   12/23/20 0400 12/23/20 0952 12/23/20 1200 12/23/20 1350  BP: (!) 181/81 (!) 160/98 (!) 186/76 (!) 160/90  Pulse:  77 81 77  Resp: 14 18 16  20  Temp: (!) 97.5 F (36.4 C)     TempSrc: Oral     SpO2: 98% 98% 98% 100%  Weight:        Intake/Output Summary (Last 24 hours) at 12/23/2020 1428 Last data filed at 12/23/2020 0545 Gross per 24 hour  Intake 612.5 ml  Output 400 ml  Net 212.5 ml   Filed Weights   12/23/20 0005  Weight: 52.5 kg    Gen: Elderly female in no distress Pulm: Non-labored breathing. Clear to auscultation bilaterally.  CV: Regular rate and rhythm. No murmur, rub, or gallop. No JVD, no pedal edema. GI: Abdomen soft,  non-tender, non-distended, with normoactive bowel sounds. No organomegaly or masses felt. Ext: Warm, no deformities Skin: No rashes, lesions or ulcers Neuro: Alert and seemingly oriented. Moves all extremities but not cooperative with full exam. Psych: Judgement and insight impaired.  Data Reviewed: I have personally reviewed following labs and imaging studies  CBC: Recent Labs  Lab 12/22/20 1749 12/23/20 0022  WBC 7.1 7.6  NEUTROABS 5.5  --   HGB 12.2 11.6*  HCT 37.2 34.6*  MCV 92.3 89.9  PLT 129* 828   Basic Metabolic Panel: Recent Labs  Lab 12/22/20 1749 12/23/20 0022  NA 130* 132*  K 4.5 3.4*  CL 94* 98  CO2 20* 21*  GLUCOSE 113* 111*  BUN 31* 28*  CREATININE 1.52* 1.38*  CALCIUM 9.0 8.5*  MG  --  1.7  PHOS  --  3.5   GFR: Estimated Creatinine Clearance: 24.4 mL/min (A) (by C-G formula based on SCr of 1.38 mg/dL (H)). Liver Function Tests: Recent Labs  Lab 12/22/20 1749 12/23/20 0022  AST 55* 50*  ALT 23 21  ALKPHOS 42 38  BILITOT 1.5* 1.1  PROT 7.3 7.1  ALBUMIN 3.7 3.5   No results for input(s): LIPASE, AMYLASE in the last 168 hours. Recent Labs  Lab 12/22/20 1749 12/23/20 0022  AMMONIA 35 20   Coagulation Profile: Recent Labs  Lab 12/23/20 0022  INR 1.2   Cardiac Enzymes: Recent Labs  Lab 12/22/20 1749 12/23/20 0022  CKTOTAL 1,045* 931*   BNP (last 3 results) No results for input(s): PROBNP in the last 8760 hours. HbA1C: No results for input(s): HGBA1C in the last 72 hours. CBG: Recent Labs  Lab 12/22/20 1814  GLUCAP 115*   Lipid Profile: No results for input(s): CHOL, HDL, LDLCALC, TRIG, CHOLHDL, LDLDIRECT in the last 72 hours. Thyroid Function Tests: Recent Labs    12/23/20 0022  TSH 11.790*   Anemia Panel: No results for input(s): VITAMINB12, FOLATE, FERRITIN, TIBC, IRON, RETICCTPCT in the last 72 hours. Urine analysis:    Component Value Date/Time   COLORURINE YELLOW 12/23/2020 0552   APPEARANCEUR CLOUDY (A)  12/23/2020 0552   LABSPEC 1.012 12/23/2020 0552   PHURINE 6.0 12/23/2020 0552   GLUCOSEU NEGATIVE 12/23/2020 0552   HGBUR SMALL (A) 12/23/2020 0552   BILIRUBINUR NEGATIVE 12/23/2020 0552   [place] 12/23/2020 0552   PROTEINUR >=300 (A) 12/23/2020 0552   NITRITE NEGATIVE 12/23/2020 0552   LEUKOCYTESUR LARGE (A) 12/23/2020 0552   Recent Results (from the past 240 hour(s))  Resp Panel by RT-PCR (Flu A&B, Covid) Nasopharyngeal Swab     Status: None   Collection Time: 12/22/20  6:41 PM   Specimen: Nasopharyngeal Swab; Nasopharyngeal(NP) swabs in vial transport medium  Result Value Ref Range Status   SARS Coronavirus 2 by RT PCR NEGATIVE NEGATIVE Final    Comment: (NOTE) SARS-CoV-2 target nucleic acids are NOT  DETECTED.  The SARS-CoV-2 RNA is generally detectable in upper respiratory specimens during the acute phase of infection. The lowest concentration of SARS-CoV-2 viral copies this assay can detect is 138 copies/mL. A negative result does not preclude SARS-Cov-2 infection and should not be used as the sole basis for treatment or other patient management decisions. A negative result may occur with  improper specimen collection/handling, submission of specimen other than nasopharyngeal swab, presence of viral mutation(s) within the areas targeted by this assay, and inadequate number of viral copies(<138 copies/mL). A negative result must be combined with clinical observations, patient history, and epidemiological information. The expected result is Negative.  Fact Sheet for Patients:  EntrepreneurPulse.com.au  Fact Sheet for Healthcare Providers:  IncredibleEmployment.be  This test is no t yet approved or cleared by the [place] FDA and  has been authorized for detection and/or diagnosis of SARS-CoV-2 by FDA under an Emergency Use Authorization (EUA). This EUA will remain  in effect (meaning this test can be used) for the  duration of the COVID-19 declaration under Section 564(b)(1) of the Act, 21 U.S.C.section 360bbb-3(b)(1), unless the authorization is terminated  or revoked sooner.       Influenza A by PCR NEGATIVE NEGATIVE Final   Influenza B by PCR NEGATIVE NEGATIVE Final    Comment: (NOTE) The Xpert Xpress SARS-CoV-2/FLU/RSV plus assay is intended as an aid in the diagnosis of influenza from Nasopharyngeal swab specimens and should not be used as a sole basis for treatment. Nasal washings and aspirates are unacceptable for Xpert Xpress SARS-CoV-2/FLU/RSV testing.  Fact Sheet for Patients: EntrepreneurPulse.com.au  Fact Sheet for Healthcare Providers: IncredibleEmployment.be  This test is not yet approved or cleared by the [place] FDA and has been authorized for detection and/or diagnosis of SARS-CoV-2 by FDA under an Emergency Use Authorization (EUA). This EUA will remain in effect (meaning this test can be used) for the duration of the COVID-19 declaration under Section 564(b)(1) of the Act, 21 U.S.C. section 360bbb-3(b)(1), unless the authorization is terminated or revoked.  Performed at [facility], [place] [address], [place], [place] [address]       Radiology Studies: CT Head Wo Contrast  Result Date: 12/22/2020 CLINICAL DATA:  Mental status change. EXAM: CT HEAD WITHOUT CONTRAST TECHNIQUE: Contiguous axial images were obtained from the base of the skull through the vertex without intravenous contrast. COMPARISON:  November 15, 2020 FINDINGS: Brain: No evidence of acute infarction, hemorrhage, hydrocephalus, extra-axial collection or mass lesion/mass effect. Extensive small vessel ischemic changes throughout the deep white matter. This small right thalamic infarct seen by MRI dated April 2022 is not clearly visualized, likely obscured by microvascular changes. Vascular: Calcific atherosclerotic disease of the intra cavernous carotid arteries.  Skull: Normal. Negative for fracture or focal lesion. Sinuses/Orbits: No acute finding. Other: None. IMPRESSION: 1. No acute intracranial abnormality. 2. Extensive small vessel ischemic changes throughout the deep white matter. 3. The small right thalamic infarct seen by MRI dated April 2022 is not clearly visualized, likely obscured by microvascular changes. Electronically Signed   By: Fidela Salisbury M.D.   On: 12/22/2020 19:24   CT Cervical Spine Wo Contrast  Result Date: 12/22/2020 CLINICAL DATA:  85 year old female with neck trauma. EXAM: CT CERVICAL SPINE WITHOUT CONTRAST TECHNIQUE: Multidetector CT imaging of the cervical spine was performed without intravenous contrast. Multiplanar CT image reconstructions were also generated. COMPARISON:  CT angiography dated 11/15/2020. FINDINGS: Alignment: No acute subluxation Skull base and vertebrae: No acute fracture. Osteopenia. Soft tissues  and spinal canal: No prevertebral fluid or swelling. No visible canal hematoma. Disc levels: Multilevel degenerative changes with disc space narrowing and endplate irregularity and spurring. Upper chest: Negative. Other: Bilateral carotid bulb calcified plaques. IMPRESSION: 1. No acute/traumatic cervical spine pathology. 2. Multilevel degenerative changes. Electronically Signed   By: Anner Crete M.D.   On: 12/22/2020 20:48   CT PELVIS WO CONTRAST  Result Date: 12/22/2020 CLINICAL DATA:  Pelvic trauma EXAM: CT PELVIS WITHOUT CONTRAST TECHNIQUE: Multidetector CT imaging of the pelvis was performed following the standard protocol without intravenous contrast. COMPARISON:  12/22/2020 FINDINGS: Evaluation is limited by patient motion throughout the exam. Urinary Tract:  No urinary tract calculi.  Bladder is unremarkable. Bowel: Diverticulosis of the distal colon without diverticulitis. No evidence of obstruction or ileus. Vascular/Lymphatic: Diffuse atherosclerosis. No pathologic adenopathy. Reproductive: Calcified  uterine fibroid. The uterus is atrophic. No adnexal masses. Other:  No free fluid or free gas.  No abdominal wall hernia. Musculoskeletal: Patient motion results in significant artifact through the lower pelvis, particularly through the level of the right superior pubic ramus or possible fracture was seen on radiograph. No definite fractures are identified on the provided images. Prominent degenerative changes are seen within the lower lumbar spine. Reconstructed images demonstrate no additional findings. IMPRESSION: 1. Extensive patient motion through the lower pelvis limits evaluation. Specifically, there is extensive motion artifact through the level of the right superior pubic ramus where a possible abnormality was seen on radiograph. 2. No definite displaced fractures are identified. However, if fracture remains a concern the patient could be brought back for repeat imaging through the lower pelvis and addendum to this report provided. 3. Distal colonic diverticulosis without diverticulitis. 4.  Aortic Atherosclerosis (ICD10-I70.0). Electronically Signed   By: Randa Ngo M.D.   On: 12/22/2020 20:50   DG Chest Port 1 View  Result Date: 12/22/2020 CLINICAL DATA:  85 year old female with chest pain. EXAM: PORTABLE CHEST 1 VIEW COMPARISON:  None. FINDINGS: No focal consolidation, pleural effusion or pneumothorax. The cardiac silhouette is within limits. Atherosclerotic calcification of the aorta. No acute osseous pathology. Degenerative changes of the spine. IMPRESSION: No active disease. Electronically Signed   By: Anner Crete M.D.   On: 12/22/2020 22:20   DG Hip Unilat With Pelvis 2-3 Views Right  Result Date: 12/22/2020 CLINICAL DATA:  Weakness, altered level of consciousness EXAM: DG HIP (WITH OR WITHOUT PELVIS) 2-3V RIGHT COMPARISON:  None. FINDINGS: Frontal view of the pelvis as well as frontal and frogleg lateral views of the right hip are obtained. There is subtle cortical irregularity  within the right superior pubic ramus near the junction with the symphysis, which could reflect a minimally displaced fracture. If pain is referable to this region, CT scan may be useful for confirmation. No other acute bony abnormalities are identified. Alignment is anatomic. Mild symmetrical bilateral hip osteoarthritis. Prominent lower lumbar spondylosis and facet hypertrophy. Calcification left hemipelvis most consistent with degenerating fibroid. IMPRESSION: 1. Findings suspicious for a minimally displaced right superior pubic ramus fracture. Follow-up CT scan may be useful for confirmation. 2. Mild symmetrical bilateral hip osteoarthritis. Electronically Signed   By: Randa Ngo M.D.   On: 12/22/2020 19:06    Scheduled Meds: Continuous Infusions: . cefTRIAXone (ROCEPHIN)  IV 1 g (12/23/20 0944)  . lactated ringers 125 mL/hr at 12/23/20 1159     LOS: 1 day   Time spent: 35 minutes.  Patrecia Pour, MD [facility] [email or web address] 12/23/2020, 2:28 PM

## 2020-12-24 LAB — COMPREHENSIVE METABOLIC PANEL
ALT: 21 U/L (ref 0–44)
AST: 40 U/L (ref 15–41)
Albumin: 2.9 g/dL — ABNORMAL LOW (ref 3.5–5.0)
Alkaline Phosphatase: 38 U/L (ref 38–126)
Anion gap: 9 (ref 5–15)
BUN: 26 mg/dL — ABNORMAL HIGH (ref 8–23)
CO2: 22 mmol/L (ref 22–32)
Calcium: 8.6 mg/dL — ABNORMAL LOW (ref 8.9–10.3)
Chloride: 105 mmol/L (ref 98–111)
Creatinine, Ser: 1.32 mg/dL — ABNORMAL HIGH (ref 0.44–1.00)
GFR, Estimated: 40 mL/min — ABNORMAL LOW (ref >60)
Glucose, Bld: 112 mg/dL — ABNORMAL HIGH (ref 70–99)
Potassium: 3.7 mmol/L (ref 3.5–5.1)
Sodium: 136 mmol/L (ref 135–145)
Total Bilirubin: 0.6 mg/dL (ref 0.3–1.2)
Total Protein: 6 g/dL — ABNORMAL LOW (ref 6.5–8.1)

## 2020-12-24 LAB — CBC
HCT: 31.4 % — ABNORMAL LOW (ref 36.0–46.0)
Hemoglobin: 10.4 g/dL — ABNORMAL LOW (ref 12.0–15.0)
MCH: 30.2 pg (ref 26.0–34.0)
MCHC: 33.1 g/dL (ref 30.0–36.0)
MCV: 91.3 fL (ref 80.0–100.0)
Platelets: 254 10*3/uL (ref 150–400)
RBC: 3.44 MIL/uL — ABNORMAL LOW (ref 3.87–5.11)
RDW: 13 % (ref 11.5–15.5)
WBC: 6.5 10*3/uL (ref 4.0–10.5)
nRBC: 0 % (ref 0.0–0.2)

## 2020-12-24 LAB — T4, FREE: Free T4: 1.06 ng/dL (ref 0.61–1.12)

## 2020-12-24 NOTE — Progress Notes (Signed)
PROGRESS NOTE  AROUSH Haynes  UJW:[id number] DOB: 1935/12/24 DOA: 12/22/2020 PCP: Ma Hillock, DO   Brief Narrative: Paula Haynes is an 85 y.o. female with a history of CVA April 2022, HFpEF, HTN, stage IIIb CKD, HLD, hypothyroidism who presented to the ED 5/28 after patient was found down, confused, at home for uncertain period of time. In the ED she was confused, confirmed to be off mental baseline, afebrile with CT head and cervical spine negative for acute abnormalities, hyponatremia (Na 130), CK 1,045, UA strongly suggestive of UTI for which ceftriaxone has been started along with IV fluids for rhabdomyolysis. PT evaluation is ordered.  Assessment & Plan: Principal Problem:   Acute encephalopathy Active Problems:   Essential hypertension   CKD (chronic kidney disease) stage 3, GFR 30-59 ml/min (HCC)   Rhabdomyolysis   Acute hyponatremia   Right hip pain   Chronic diastolic CHF (congestive heart failure) (HCC)  UTI:  - Ceftriaxone 1g IV q24h - Urine culture pending (>100k GNRs currently known)  Rhabdomyolysis:  - Will continue IVF but decrease rate. Recheck CK in AM.  - Found down: PT and OT evaluation requested. No dysrhythmias on cardiac monitoring or ECG.   History of CVA: April 2022.  - Has completed 3 weeks of DAPT, continue plavix and statin - Note pt has 30-day cardiac event monitor in place, a note in EMR reports a run of SVT up to 160bpm on 5/6. I am unable to access the actual strip, though she was changed from norvasc to metoprolol by Dr. Geraldo Pitter for this.   Chronic HFpEF, HTN: Echo 11/15/2020 LVEF 55-60%, G1DD, no WMA, mild AS.   - Continue lisinopril, metoprolol - Deescalate IVF as above  Stage IIIb CKD: Stable, may be progressing to stage IV.  - Avoid nephrotoxins, Continue IVF as above.  Right superior pubic ramus cortical irregularity: On XR, not seen on motion-degraded CT. Pt denies pain currently.  - Defer further work up to after patient is  recovered and more able to contribute to history and exam.   Hypothyroidism: TSH 11.790, though free T4 wnl at 1.06. Pt's granddaughter/POA suspects the patient takes all prescribed medications reliably prior to this episode.  - Recheck TFTs in 4-6 weeks recommended.   Hypokalemia:  - Resolved with supplementation.  Thrombocytopenia:  - Resolved spontaneously.  AST elevation: Due to rhabdo most likely. Resolved.  HLD:  - Continue statin as above  DVT prophylaxis: Heparin Code Status: Full Family Communication: Granddaughter who is HCPOA by phone on 5/29 and will call again today. Disposition Plan:  Status is: Inpatient  Remains inpatient appropriate because:Altered mental status and IV treatments appropriate due to intensity of illness or inability to take PO   Dispo: The patient is from: Home              Anticipated d/c is to: SNF vs. home              Patient currently is not medically stable to d/c.   Difficult to place patient No  Consultants:   None  Procedures:   None  Antimicrobials:  Ceftriaxone   Subjective: Resting quietly. No overnight events noted. Ate < 50% breakfast thus far at time of encounter. No pain reported, no dyspnea, but she's "stunned I can't move." Says "I should be in church today." Worried about not having her car anymore. She evades orientation questions.   Objective: Vitals:   12/24/20 0300 12/24/20 0500 12/24/20 0523 12/24/20 0814  BP: Marland Kitchen)  194/98  (!) 199/83 (!) 170/90  Pulse:   71   Resp:   18   Temp:   97.8 F (36.6 C)   TempSrc:   Oral   SpO2:   97%   Weight:  52.5 kg     No intake or output data in the 24 hours ending 12/24/20 1004 Filed Weights   12/23/20 0005 12/24/20 0500  Weight: 52.5 kg 52.5 kg   Gen: Elderly, pleasant female in no distress Pulm: Nonlabored breathing room air. Clear. CV: Regular rate and rhythm. No murmur, rub, or gallop. No JVD, no dependent edema. GI: Abdomen soft, non-tender, non-distended,  with normoactive bowel sounds.  Ext: Warm, no deformities Skin: No rashes, lesions or ulcers on visualized skin. Neuro: Alert and disoriented. No focal neurological deficits. Psych: Judgement and insight appear impaired. Mood euthymic & affect congruent. Behavior is appropriate.    Data Reviewed: I have personally reviewed following labs and imaging studies  CBC: Recent Labs  Lab 12/22/20 1749 12/23/20 0022 12/24/20 0107  WBC 7.1 7.6 6.5  NEUTROABS 5.5  --   --   HGB 12.2 11.6* 10.4*  HCT 37.2 34.6* 31.4*  MCV 92.3 89.9 91.3  PLT 129* 304 341   Basic Metabolic Panel: Recent Labs  Lab 12/22/20 1749 12/23/20 0022 12/24/20 0107  NA 130* 132* 136  K 4.5 3.4* 3.7  CL 94* 98 105  CO2 20* 21* 22  GLUCOSE 113* 111* 112*  BUN 31* 28* 26*  CREATININE 1.52* 1.38* 1.32*  CALCIUM 9.0 8.5* 8.6*  MG  --  1.7  --   PHOS  --  3.5  --    GFR: Estimated Creatinine Clearance: 25.5 mL/min (A) (by C-G formula based on SCr of 1.32 mg/dL (H)). Liver Function Tests: Recent Labs  Lab 12/22/20 1749 12/23/20 0022 12/24/20 0107  AST 55* 50* 40  ALT 23 21 21   ALKPHOS 42 38 38  BILITOT 1.5* 1.1 0.6  PROT 7.3 7.1 6.0*  ALBUMIN 3.7 3.5 2.9*   No results for input(s): LIPASE, AMYLASE in the last 168 hours. Recent Labs  Lab 12/22/20 1749 12/23/20 0022  AMMONIA 35 20   Coagulation Profile: Recent Labs  Lab 12/23/20 0022  INR 1.2   Cardiac Enzymes: Recent Labs  Lab 12/22/20 1749 12/23/20 0022  CKTOTAL 1,045* 931*   BNP (last 3 results) No results for input(s): PROBNP in the last 8760 hours. HbA1C: No results for input(s): HGBA1C in the last 72 hours. CBG: Recent Labs  Lab 12/22/20 1814  GLUCAP 115*   Lipid Profile: No results for input(s): CHOL, HDL, LDLCALC, TRIG, CHOLHDL, LDLDIRECT in the last 72 hours. Thyroid Function Tests: Recent Labs    12/23/20 0022 12/24/20 0107  TSH 11.790*  --   FREET4  --  1.06   Anemia Panel: No results for input(s): VITAMINB12,  FOLATE, FERRITIN, TIBC, IRON, RETICCTPCT in the last 72 hours. Urine analysis:    Component Value Date/Time   COLORURINE YELLOW 12/23/2020 0552   APPEARANCEUR CLOUDY (A) 12/23/2020 0552   LABSPEC 1.012 12/23/2020 0552   PHURINE 6.0 12/23/2020 0552   GLUCOSEU NEGATIVE 12/23/2020 0552   HGBUR SMALL (A) 12/23/2020 0552   BILIRUBINUR NEGATIVE 12/23/2020 0552   KETONESUR NEGATIVE 12/23/2020 0552   PROTEINUR >=300 (A) 12/23/2020 0552   NITRITE NEGATIVE 12/23/2020 0552   LEUKOCYTESUR LARGE (A) 12/23/2020 0552   Recent Results (from the past 240 hour(s))  Resp Panel by RT-PCR (Flu A&B, Covid) Nasopharyngeal Swab  Status: None   Collection Time: 12/22/20  6:41 PM   Specimen: Nasopharyngeal Swab; Nasopharyngeal(NP) swabs in vial transport medium  Result Value Ref Range Status   SARS Coronavirus 2 by RT PCR NEGATIVE NEGATIVE Final    Comment: (NOTE) SARS-CoV-2 target nucleic acids are NOT DETECTED.  The SARS-CoV-2 RNA is generally detectable in upper respiratory specimens during the acute phase of infection. The lowest concentration of SARS-CoV-2 viral copies this assay can detect is 138 copies/mL. A negative result does not preclude SARS-Cov-2 infection and should not be used as the sole basis for treatment or other patient management decisions. A negative result may occur with  improper specimen collection/handling, submission of specimen other than nasopharyngeal swab, presence of viral mutation(s) within the areas targeted by this assay, and inadequate number of viral copies(<138 copies/mL). A negative result must be combined with clinical observations, patient history, and epidemiological information. The expected result is Negative.  Fact Sheet for Patients:  EntrepreneurPulse.com.au  Fact Sheet for Healthcare Providers:  IncredibleEmployment.be  This test is no t yet approved or cleared by the [place] FDA and  has been authorized  for detection and/or diagnosis of SARS-CoV-2 by FDA under an Emergency Use Authorization (EUA). This EUA will remain  in effect (meaning this test can be used) for the duration of the COVID-19 declaration under Section 564(b)(1) of the Act, 21 U.S.C.section 360bbb-3(b)(1), unless the authorization is terminated  or revoked sooner.       Influenza A by PCR NEGATIVE NEGATIVE Final   Influenza B by PCR NEGATIVE NEGATIVE Final    Comment: (NOTE) The Xpert Xpress SARS-CoV-2/FLU/RSV plus assay is intended as an aid in the diagnosis of influenza from Nasopharyngeal swab specimens and should not be used as a sole basis for treatment. Nasal washings and aspirates are unacceptable for Xpert Xpress SARS-CoV-2/FLU/RSV testing.  Fact Sheet for Patients: EntrepreneurPulse.com.au  Fact Sheet for Healthcare Providers: IncredibleEmployment.be  This test is not yet approved or cleared by the [place] FDA and has been authorized for detection and/or diagnosis of SARS-CoV-2 by FDA under an Emergency Use Authorization (EUA). This EUA will remain in effect (meaning this test can be used) for the duration of the COVID-19 declaration under Section 564(b)(1) of the Act, 21 U.S.C. section 360bbb-3(b)(1), unless the authorization is terminated or revoked.  Performed at [facility], [place] [address] [place], [place] [address]   Culture, Urine     Status: Abnormal (Preliminary result)   Collection Time: 12/23/20  7:26 AM   Specimen: Urine, Random  Result Value Ref Range Status   Specimen Description URINE, RANDOM  Final   Special Requests   Final    NONE Performed at [facility], [place] [address], [place], [place] [address]    Culture >=100,000 COLONIES/mL GRAM NEGATIVE RODS (A)  Final   Report Status PENDING  Incomplete      Radiology Studies: CT Head Wo Contrast  Result Date: 12/22/2020 CLINICAL DATA:  Mental status change. EXAM: CT HEAD  WITHOUT CONTRAST TECHNIQUE: Contiguous axial images were obtained from the base of the skull through the vertex without intravenous contrast. COMPARISON:  November 15, 2020 FINDINGS: Brain: No evidence of acute infarction, hemorrhage, hydrocephalus, extra-axial collection or mass lesion/mass effect. Extensive small vessel ischemic changes throughout the deep white matter. This small right thalamic infarct seen by MRI dated April 2022 is not clearly visualized, likely obscured by microvascular changes. Vascular: Calcific atherosclerotic disease of the intra cavernous carotid arteries. Skull: Normal.  Negative for fracture or focal lesion. Sinuses/Orbits: No acute finding. Other: None. IMPRESSION: 1. No acute intracranial abnormality. 2. Extensive small vessel ischemic changes throughout the deep white matter. 3. The small right thalamic infarct seen by MRI dated April 2022 is not clearly visualized, likely obscured by microvascular changes. Electronically Signed   By: Fidela Salisbury M.D.   On: 12/22/2020 19:24   CT Cervical Spine Wo Contrast  Result Date: 12/22/2020 CLINICAL DATA:  85 year old female with neck trauma. EXAM: CT CERVICAL SPINE WITHOUT CONTRAST TECHNIQUE: Multidetector CT imaging of the cervical spine was performed without intravenous contrast. Multiplanar CT image reconstructions were also generated. COMPARISON:  CT angiography dated 11/15/2020. FINDINGS: Alignment: No acute subluxation Skull base and vertebrae: No acute fracture. Osteopenia. Soft tissues and spinal canal: No prevertebral fluid or swelling. No visible canal hematoma. Disc levels: Multilevel degenerative changes with disc space narrowing and endplate irregularity and spurring. Upper chest: Negative. Other: Bilateral carotid bulb calcified plaques. IMPRESSION: 1. No acute/traumatic cervical spine pathology. 2. Multilevel degenerative changes. Electronically Signed   By: Anner Crete M.D.   On: 12/22/2020 20:48   CT PELVIS WO  CONTRAST  Result Date: 12/22/2020 CLINICAL DATA:  Pelvic trauma EXAM: CT PELVIS WITHOUT CONTRAST TECHNIQUE: Multidetector CT imaging of the pelvis was performed following the standard protocol without intravenous contrast. COMPARISON:  12/22/2020 FINDINGS: Evaluation is limited by patient motion throughout the exam. Urinary Tract:  No urinary tract calculi.  Bladder is unremarkable. Bowel: Diverticulosis of the distal colon without diverticulitis. No evidence of obstruction or ileus. Vascular/Lymphatic: Diffuse atherosclerosis. No pathologic adenopathy. Reproductive: Calcified uterine fibroid. The uterus is atrophic. No adnexal masses. Other:  No free fluid or free gas.  No abdominal wall hernia. Musculoskeletal: Patient motion results in significant artifact through the lower pelvis, particularly through the level of the right superior pubic ramus or possible fracture was seen on radiograph. No definite fractures are identified on the provided images. Prominent degenerative changes are seen within the lower lumbar spine. Reconstructed images demonstrate no additional findings. IMPRESSION: 1. Extensive patient motion through the lower pelvis limits evaluation. Specifically, there is extensive motion artifact through the level of the right superior pubic ramus where a possible abnormality was seen on radiograph. 2. No definite displaced fractures are identified. However, if fracture remains a concern the patient could be brought back for repeat imaging through the lower pelvis and addendum to this report provided. 3. Distal colonic diverticulosis without diverticulitis. 4.  Aortic Atherosclerosis (ICD10-I70.0). Electronically Signed   By: Randa Ngo M.D.   On: 12/22/2020 20:50   DG Chest Port 1 View  Result Date: 12/22/2020 CLINICAL DATA:  85 year old female with chest pain. EXAM: PORTABLE CHEST 1 VIEW COMPARISON:  None. FINDINGS: No focal consolidation, pleural effusion or pneumothorax. The cardiac  silhouette is within limits. Atherosclerotic calcification of the aorta. No acute osseous pathology. Degenerative changes of the spine. IMPRESSION: No active disease. Electronically Signed   By: Anner Crete M.D.   On: 12/22/2020 22:20   DG Hip Unilat With Pelvis 2-3 Views Right  Result Date: 12/22/2020 CLINICAL DATA:  Weakness, altered level of consciousness EXAM: DG HIP (WITH OR WITHOUT PELVIS) 2-3V RIGHT COMPARISON:  None. FINDINGS: Frontal view of the pelvis as well as frontal and frogleg lateral views of the right hip are obtained. There is subtle cortical irregularity within the right superior pubic ramus near the junction with the symphysis, which could reflect a minimally displaced fracture. If pain is referable to this region, CT scan  may be useful for confirmation. No other acute bony abnormalities are identified. Alignment is anatomic. Mild symmetrical bilateral hip osteoarthritis. Prominent lower lumbar spondylosis and facet hypertrophy. Calcification left hemipelvis most consistent with degenerating fibroid. IMPRESSION: 1. Findings suspicious for a minimally displaced right superior pubic ramus fracture. Follow-up CT scan may be useful for confirmation. 2. Mild symmetrical bilateral hip osteoarthritis. Electronically Signed   By: Randa Ngo M.D.   On: 12/22/2020 19:06    Scheduled Meds: . atorvastatin  40 mg Oral Daily  . clopidogrel  75 mg Oral Daily  . heparin injection (subcutaneous)  5,000 Units Subcutaneous Q8H  . levothyroxine  100 mcg Oral QAC breakfast  . lisinopril  40 mg Oral Daily  . metoprolol succinate  50 mg Oral Daily   Continuous Infusions: . cefTRIAXone (ROCEPHIN)  IV 1 g (12/24/20 0810)  . lactated ringers 125 mL/hr at 12/24/20 0928     LOS: 2 days   Time spent: 35 minutes.  Patrecia Pour, MD [facility] [email or web address] 12/24/2020, 10:04 AM

## 2020-12-24 NOTE — Evaluation (Signed)
Physical Therapy Evaluation Patient Details Name: Paula Haynes MRN: [id number] DOB: [birth year] Today's Date: 12/24/2020   History of Present Illness  85 y.o. female presenting to ED on 5/28 with AMS after being found down at home for at least 48 hours. Admitted with acute metabolic encephalopathy, rhabdo and UTI. XR R hip suggestive of R superior pubic ramus irregularity. PMHx significant for acute punctate CVA in R lateral thalamus in 10/2020 without residual deficits, HTN, thyroid disease, CKD IIIb and anemia.  Clinical Impression  Pt admitted with/for AMS, rhabdo, UTI.  Pt still confused, not safety aware and needing minimal assist for safe mobility. .  Pt currently limited functionally due to the problems listed below.  (see problems list.)  Pt will benefit from PT to maximize function and safety to be able to get home safely with available assist .      Follow Up Recommendations Home health PT;Other (comment);Supervision/Assistance - 24 hour (initially up to 24 hours as needed for safety)    Equipment Recommendations  None recommended by PT    Recommendations for Other Services       Precautions / Restrictions Precautions Precautions: Fall      Mobility  Bed Mobility Overal bed mobility: Needs Assistance Bed Mobility: Rolling;Sidelying to Sit Rolling: Supervision Sidelying to sit: Supervision;HOB elevated       General bed mobility comments: Supervision A for safety.    Transfers Overall transfer level: Needs assistance Equipment used: 1 person hand held assist Transfers: Sit to/from Stand Sit to Stand: Min assist         General transfer comment: min stability assist  Ambulation/Gait Ambulation/Gait assistance: Min assist Gait Distance (Feet): 180 Feet Assistive device: None Gait Pattern/deviations: Step-through pattern   Gait velocity interpretation: <1.8 ft/sec, indicate of risk for recurrent falls General Gait Details: unsteady and tentative, short  step length, grabbing for the available rail  Stairs            Wheelchair Mobility    Modified Rankin (Stroke Patients Only)       Balance Overall balance assessment: Needs assistance Sitting-balance support: Single extremity supported;Bilateral upper extremity supported;Feet supported Sitting balance-Leahy Scale: Fair     Standing balance support: Single extremity supported Standing balance-Leahy Scale: Poor Standing balance comment: Reliant on external assist.                             Pertinent Vitals/Pain Pain Assessment: No/denies pain    Home Living Family/patient expects to be discharged to:: Private residence Living Arrangements: Alone Available Help at Discharge: Family;Available 24 hours/day Type of Home: House Home Access: Stairs to enter Entrance Stairs-Rails: Right Entrance Stairs-Number of Steps: 2 flights Home Layout: Multi-level Home Equipment: Grab bars - toilet;Grab bars - tub/shower;Shower seat;Cane - single point Additional Comments: Patient currently in the process of moving to a home beside her son Remo Lipps.    Prior Function Level of Independence: Independent;Independent with assistive device(s)         Comments: Uses shower seat for bathing; independent with IADLs; drives     Hand Dominance   Dominant Hand: Right    Extremity/Trunk Assessment   Upper Extremity Assessment Upper Extremity Assessment: Defer to OT evaluation    Lower Extremity Assessment Lower Extremity Assessment: Generalized weakness       Communication   Communication: Expressive difficulties (Patient with mild word finding difficulties)  Cognition Arousal/Alertness: Awake/alert Behavior During Therapy: Restless Overall Cognitive Status: Impaired/Different from  baseline Area of Impairment: Orientation;Memory;Safety/judgement;Awareness;Problem solving                 Orientation Level: Place;Time;Situation   Memory: Decreased  short-term memory   Safety/Judgement: Decreased awareness of safety Awareness: Intellectual Problem Solving: Slow processing;Difficulty sequencing General Comments: Patient with increased time needed to process verbal information. Difficulty sequencing familiar tasks including opening straw and placing in cup before taking sips.      General Comments General comments (skin integrity, edema, etc.): vss    Exercises     Assessment/Plan    PT Assessment Patient needs continued PT services  PT Problem List Decreased strength;Decreased activity tolerance;Decreased mobility;Decreased balance;Decreased safety awareness;Decreased cognition       PT Treatment Interventions Gait training;Stair training;Functional mobility training;Therapeutic activities;Patient/family education    PT Goals (Current goals can be found in the Care Plan section)  Acute Rehab PT Goals Patient Stated Goal: No goals stated PT Goal Formulation: Patient unable to participate in goal setting Time For Goal Achievement: 12/31/20 Potential to Achieve Goals: Good    Frequency Min 3X/week   Barriers to discharge        Co-evaluation               AM-PAC PT "6 Clicks" Mobility  Outcome Measure Help needed turning from your back to your side while in a flat bed without using bedrails?: A Little Help needed moving from lying on your back to sitting on the side of a flat bed without using bedrails?: A Little Help needed moving to and from a bed to a chair (including a wheelchair)?: A Little Help needed standing up from a chair using your arms (e.g., wheelchair or bedside chair)?: A Little Help needed to walk in hospital room?: A Little Help needed climbing 3-5 steps with a railing? : A Little 6 Click Score: 18    End of Session   Activity Tolerance: Patient tolerated treatment well Patient left: in chair;with call bell/phone within reach;with chair alarm set;with family/visitor present Nurse  Communication: Mobility status PT Visit Diagnosis: Unsteadiness on feet (R26.81);Difficulty in walking, not elsewhere classified (R26.2)    Time: 2725-3664 PT Time Calculation (min) (ACUTE ONLY): 21 min   Charges:   PT Evaluation $PT Eval Moderate Complexity: 1 Mod          12/24/2020  Ginger Carne., PT Acute Rehabilitation Services [phone]  (pager) [phone]  (office)  Tessie Fass Raneem [place] 12/24/2020, 5:03 PM

## 2020-12-24 NOTE — Evaluation (Signed)
Occupational Therapy Evaluation Patient Details Name: Paula Haynes MRN: [id number] DOB: Aug 21, 1935 Today's Date: 12/24/2020    History of Present Illness 85 y.o. female presenting to ED on 5/28 with AMS after being found down at home for at least 48 hours. Admitted with acute metabolic encephalopathy, rhabdo and UTI. XR R hip suggestive of R superior pubic ramus irregularity. PMHx significant for acute punctate CVA in R lateral thalamus in 10/2020 without residual deficits, HTN, thyroid disease, CKD IIIb and anemia.   Clinical Impression   PTA patient was living alone in a multi-level private residence and was grossly I with ADLs/IADLs without AD. Patient currently functioning below baseline demonstrating observed ADLs including LB dressing and ADL trasnfers with Min  A. Patient also limited by deficits listed below including decreased balance, decreased cognition, and generalized weakness and would benefit from continued acute OT services in prep for safe d/c home with 24hr supervision/assist from family and Harold.    Follow Up Recommendations  Home health OT;Supervision/Assistance - 24 hour    Equipment Recommendations  None recommended by OT    Recommendations for Other Services       Precautions / Restrictions Precautions Precautions: Fall Restrictions Weight Bearing Restrictions: No      Mobility Bed Mobility Overal bed mobility: Needs Assistance Bed Mobility: Rolling;Sidelying to Sit Rolling: Supervision Sidelying to sit: Supervision;HOB elevated       General bed mobility comments: Supervision A for safety.    Transfers Overall transfer level: Needs assistance Equipment used: 1 person hand held assist Transfers: Sit to/from Stand Sit to Stand: Min assist         General transfer comment: Min A for transfers with HHA +1. Patient very unsteady.    Balance Overall balance assessment: Needs assistance Sitting-balance support: Single extremity  supported;Bilateral upper extremity supported;Feet supported Sitting balance-Leahy Scale: Fair     Standing balance support: Single extremity supported Standing balance-Leahy Scale: Poor Standing balance comment: Reliant on external assist.                           ADL either performed or assessed with clinical judgement   ADL Overall ADL's : Needs assistance/impaired Eating/Feeding: Set up;Sitting   Grooming: Set up;Sitting               Lower Body Dressing: Minimal assistance;Sit to/from stand Lower Body Dressing Details (indicate cue type and reason): Able to doff/don footwear seated EOB. Would likely require Min A to don LB clothing in standing secondary to balance deficits. Toilet Transfer: Minimal Print production planner Details (indicate cue type and reason): Simulated with transfer to recliner with Min A and HHA +1.                 Vision Patient Visual Report: No change from baseline       Perception     Praxis      Pertinent Vitals/Pain Pain Assessment: No/denies pain     Hand Dominance Right   Extremity/Trunk Assessment Upper Extremity Assessment Upper Extremity Assessment: Generalized weakness (Symmetrical bilaterally)   Lower Extremity Assessment Lower Extremity Assessment: Defer to PT evaluation       Communication Communication Communication: Expressive difficulties (Patient with mild word finding difficulties)   Cognition Arousal/Alertness: Awake/alert Behavior During Therapy: Restless Overall Cognitive Status: Impaired/Different from baseline Area of Impairment: Orientation;Memory;Safety/judgement;Awareness;Problem solving                 Orientation Level: Place;Time;Situation  Memory: Decreased short-term memory   Safety/Judgement: Decreased awareness of safety Awareness: Intellectual Problem Solving: Slow processing;Difficulty sequencing General Comments: Patient with increased time needed to process  verbal information. Difficulty sequencing familiar tasks including opening straw and placing in cup before taking sips.   General Comments  VSS on RA.    Exercises     Shoulder Instructions      Home Living Family/patient expects to be discharged to:: Private residence Living Arrangements: Alone Available Help at Discharge: Family;Available 24 hours/day Type of Home: House Home Access: Stairs to enter [employer] of Steps: 2 flights Entrance Stairs-Rails: Right Home Layout: Multi-level     Bathroom Shower/Tub: Walk-in shower;Tub/shower unit   Bathroom Toilet: Standard     Home Equipment: Grab bars - toilet;Grab bars - tub/shower;Shower seat;Cane - single point   Additional Comments: Patient currently in the process of moving to a home beside her son Remo Lipps.      Prior Functioning/Environment Level of Independence: Independent;Independent with assistive device(s)        Comments: Uses shower seat for bathing; independent with IADLs; drives        OT Problem List: Decreased strength;Impaired balance (sitting and/or standing);Decreased cognition;Decreased safety awareness;Decreased knowledge of use of DME or AE      OT Treatment/Interventions: Self-care/ADL training;Therapeutic exercise;DME and/or AE instruction;Therapeutic activities;Cognitive remediation/compensation;Patient/family education;Balance training    OT Goals(Current goals can be found in the care plan section) Acute Rehab OT Goals Patient Stated Goal: No goals stated OT Goal Formulation: With patient/family Time For Goal Achievement: 01/07/21 Potential to Achieve Goals: Good ADL Goals Pt Will Perform Grooming: with supervision;standing Pt Will Perform Upper Body Dressing: with supervision;standing Pt Will Perform Lower Body Dressing: with supervision;sit to/from stand Pt Will Transfer to Toilet: with supervision;ambulating Pt Will Perform Toileting - Clothing Manipulation and hygiene: with  supervision;sit to/from stand Pt Will Perform Tub/Shower Transfer: with supervision;ambulating  OT Frequency: Min 2X/week   Barriers to D/C:            Co-evaluation              AM-PAC OT "6 Clicks" Daily Activity     Outcome Measure Help from another person eating meals?: None Help from another person taking care of personal grooming?: A Little Help from another person toileting, which includes using toliet, bedpan, or urinal?: A Little Help from another person bathing (including washing, rinsing, drying)?: A Lot Help from another person to put on and taking off regular upper body clothing?: A Little Help from another person to put on and taking off regular lower body clothing?: A Little 6 Click Score: 18   End of Session Equipment Utilized During Treatment: Gait belt Nurse Communication: Mobility status  Activity Tolerance: Patient tolerated treatment well Patient left: in chair;with call bell/phone within reach;with chair alarm set  OT Visit Diagnosis: Unsteadiness on feet (R26.81);Muscle weakness (generalized) (M62.81);History of falling (Z91.81)                Time: 3419-6222 OT Time Calculation (min): 21 min Charges:  OT General Charges $OT Visit: 1 Visit OT Evaluation $OT Eval Low Complexity: 1 Low  Taevin Mcferran H. OTR/L Supplemental OT, Department of rehab services [phone]  Katherina Wimer R H. 12/24/2020, 11:01 AM

## 2020-12-25 LAB — BASIC METABOLIC PANEL
Anion gap: 10 (ref 5–15)
BUN: 24 mg/dL — ABNORMAL HIGH (ref 8–23)
CO2: 23 mmol/L (ref 22–32)
Calcium: 8.3 mg/dL — ABNORMAL LOW (ref 8.9–10.3)
Chloride: 104 mmol/L (ref 98–111)
Creatinine, Ser: 1.17 mg/dL — ABNORMAL HIGH (ref 0.44–1.00)
GFR, Estimated: 46 mL/min — ABNORMAL LOW (ref >60)
Glucose, Bld: 127 mg/dL — ABNORMAL HIGH (ref 70–99)
Potassium: 3.5 mmol/L (ref 3.5–5.1)
Sodium: 137 mmol/L (ref 135–145)

## 2020-12-25 LAB — URINE CULTURE: Culture: >=100000 — AB

## 2020-12-25 LAB — CK: Total CK: 245 U/L — ABNORMAL HIGH (ref 38–234)

## 2020-12-25 MED ORDER — AMLODIPINE BESYLATE 10 MG PO TABS
10.0000 mg | ORAL_TABLET | Freq: Every day | ORAL | Status: DC
  Administered 2020-12-25 – 2020-12-26 (×2): 10 mg via ORAL
  Filled 2020-12-25: qty 1

## 2020-12-25 NOTE — Progress Notes (Addendum)
PROGRESS NOTE  Paula Haynes  WRU:[id number] DOB: 07-05-1936 DOA: 12/22/2020 PCP: Ma Hillock, DO   Brief Narrative: Paula Haynes is an 85 y.o. female with a history of CVA April 2022, HFpEF, HTN, stage IIIb CKD, HLD, hypothyroidism who presented to the ED 5/28 after patient was found down, confused, at home for uncertain period of time. In the ED she was confused, confirmed to be off mental baseline, afebrile with CT head and cervical spine negative for acute abnormalities, hyponatremia (Na 130), CK 1,045, UA strongly suggestive of UTI for which ceftriaxone has been started along with IV fluids for rhabdomyolysis. PT evaluation is ordered.  Assessment & Plan: Principal Problem:   Acute encephalopathy Active Problems:   Essential hypertension   CKD (chronic kidney disease) stage 3, GFR 30-59 ml/min (HCC)   Rhabdomyolysis   Acute hyponatremia   Right hip pain   Chronic diastolic CHF (congestive heart failure) (HCC)  Acute metabolic encephalopathy: Pt's mentation has improved, though still does not appear to be at her baseline and having labile mood.  - Continue Tx UTI, delirium precautions.   UTI:  - Ceftriaxone 1g IV q24h - Urine culture pending (>100k Klebsiella)  Rhabdomyolysis:  - CK has sustained improvement.  - Found down: PT and OT evaluations performed, recommending 24 hour supervision at home.  History of CVA: April 2022.  - Has completed 3 weeks of DAPT, continue plavix and statin - Note pt has 30-day cardiac event monitor in place, a note in EMR reports a run of SVT up to 160bpm on 5/6. I am unable to access the actual strip, though she was changed from norvasc to metoprolol by Dr. Geraldo Pitter for this.   Chronic HFpEF, HTN: Echo 11/15/2020 LVEF 55-60%, G1DD, no WMA, mild AS.   - Continue lisinopril, metoprolol. BP severely elevated. Will add norvasc which the patient took in the past.   Stage IIIb CKD: Stable, may be progressing to stage IV.  - Avoid nephrotoxins.  SCr has continued improvement on IVF. Pt taking adequate po, so will stop IVF.  Right superior pubic ramus cortical irregularity: On XR, not seen on motion-degraded CT. Pt denies pain currently.  - Defer further work up. Pt does not report pain in this region now.  Hypothyroidism: TSH 11.790, though free T4 wnl at 1.06. Pt's granddaughter/POA suspects the patient takes all prescribed medications reliably prior to this episode.  - Recheck TFTs in 4-6 weeks recommended.   Hypokalemia:  - Resolved with supplementation.  Thrombocytopenia:  - Resolved spontaneously.  AST elevation: Due to rhabdo most likely. Resolved.  HLD:  - Continue statin as above  DVT prophylaxis: Heparin Code Status: Full Family Communication: Granddaughter who is HCPOA by phone on 5/29, 5/30, not able to reach her by phone 5/31 x3. On last I left HIPAA-compliant voicemail. Disposition Plan:  Status is: Inpatient  Remains inpatient appropriate because:Altered mental status and Inpatient level of care appropriate due to severity of illness   Dispo: The patient is from: Home              Anticipated d/c is to: Home 6/1              Patient currently is not medically stable to d/c.   Difficult to place patient No  Consultants:   None  Procedures:   None  Antimicrobials:  Ceftriaxone   Subjective: Up to chair today, having some low back pain. Has started eating and drinking more. Has unclear understanding of the past  few days. Her baseline is to be very sharp and converse with full orientation.  Objective: Vitals:   12/25/20 0100 12/25/20 0154 12/25/20 0600 12/25/20 1023  BP: (!) 210/99  (!) 198/98 (!) 169/88  Pulse:    72  Resp:      Temp:    98.1 F (36.7 C)  TempSrc:    Oral  SpO2:    100%  Weight:  52.5 kg      Intake/Output Summary (Last 24 hours) at 12/25/2020 1253 Last data filed at 12/25/2020 0730 Gross per 24 hour  Intake 3837.83 ml  Output 500 ml  Net 3337.83 ml   Filed Weights    12/23/20 0005 12/24/20 0500 12/25/20 0154  Weight: 52.5 kg 52.5 kg 52.5 kg   Gen: 85 y.o. female in no distress Pulm: Nonlabored breathing room air. Clear. CV: Regular rate and rhythm. No murmur, rub, or gallop. No JVD, no dependent edema. GI: Abdomen soft, non-tender, non-distended, with normoactive bowel sounds.  Ext: Warm, no deformities Skin: No rashes, lesions or ulcers on visualized skin. Neuro: Alert and incompletely oriented. No focal neurological deficits. Psych: Judgement and insight appear marginal/impaired. Mood anxious & affect labile.   Data Reviewed: I have personally reviewed following labs and imaging studies  CBC: Recent Labs  Lab 12/22/20 1749 12/23/20 0022 12/24/20 0107  WBC 7.1 7.6 6.5  NEUTROABS 5.5  --   --   HGB 12.2 11.6* 10.4*  HCT 37.2 34.6* 31.4*  MCV 92.3 89.9 91.3  PLT 129* 304 237   Basic Metabolic Panel: Recent Labs  Lab 12/22/20 1749 12/23/20 0022 12/24/20 0107 12/25/20 0151  NA 130* 132* 136 137  K 4.5 3.4* 3.7 3.5  CL 94* 98 105 104  CO2 20* 21* 22 23  GLUCOSE 113* 111* 112* 127*  BUN 31* 28* 26* 24*  CREATININE 1.52* 1.38* 1.32* 1.17*  CALCIUM 9.0 8.5* 8.6* 8.3*  MG  --  1.7  --   --   PHOS  --  3.5  --   --    GFR: Estimated Creatinine Clearance: 28.8 mL/min (A) (by C-G formula based on SCr of 1.17 mg/dL (H)). Liver Function Tests: Recent Labs  Lab 12/22/20 1749 12/23/20 0022 12/24/20 0107  AST 55* 50* 40  ALT 23 21 21   ALKPHOS 42 38 38  BILITOT 1.5* 1.1 0.6  PROT 7.3 7.1 6.0*  ALBUMIN 3.7 3.5 2.9*   No results for input(s): LIPASE, AMYLASE in the last 168 hours. Recent Labs  Lab 12/22/20 1749 12/23/20 0022  AMMONIA 35 20   Coagulation Profile: Recent Labs  Lab 12/23/20 0022  INR 1.2   Cardiac Enzymes: Recent Labs  Lab 12/22/20 1749 12/23/20 0022 12/25/20 0151  CKTOTAL 1,045* 931* 245*   BNP (last 3 results) No results for input(s): PROBNP in the last 8760 hours. HbA1C: No results for input(s):  HGBA1C in the last 72 hours. CBG: Recent Labs  Lab 12/22/20 1814  GLUCAP 115*   Lipid Profile: No results for input(s): CHOL, HDL, LDLCALC, TRIG, CHOLHDL, LDLDIRECT in the last 72 hours. Thyroid Function Tests: Recent Labs    12/23/20 0022 12/24/20 0107  TSH 11.790*  --   FREET4  --  1.06   Anemia Panel: No results for input(s): VITAMINB12, FOLATE, FERRITIN, TIBC, IRON, RETICCTPCT in the last 72 hours. Urine analysis:    Component Value Date/Time   COLORURINE YELLOW 12/23/2020 0552   APPEARANCEUR CLOUDY (A) 12/23/2020 0552   LABSPEC 1.012 12/23/2020 [place]  6.0 12/23/2020 0552   GLUCOSEU NEGATIVE 12/23/2020 0552   HGBUR SMALL (A) 12/23/2020 0552   BILIRUBINUR NEGATIVE 12/23/2020 [place] 12/23/2020 0552   PROTEINUR >=300 (A) 12/23/2020 0552   NITRITE NEGATIVE 12/23/2020 0552   LEUKOCYTESUR LARGE (A) 12/23/2020 0552   Recent Results (from the past 240 hour(s))  Resp Panel by RT-PCR (Flu A&B, Covid) Nasopharyngeal Swab     Status: None   Collection Time: 12/22/20  6:41 PM   Specimen: Nasopharyngeal Swab; Nasopharyngeal(NP) swabs in vial transport medium  Result Value Ref Range Status   SARS Coronavirus 2 by RT PCR NEGATIVE NEGATIVE Final    Comment: (NOTE) SARS-CoV-2 target nucleic acids are NOT DETECTED.  The SARS-CoV-2 RNA is generally detectable in upper respiratory specimens during the acute phase of infection. The lowest concentration of SARS-CoV-2 viral copies this assay can detect is 138 copies/mL. A negative result does not preclude SARS-Cov-2 infection and should not be used as the sole basis for treatment or other patient management decisions. A negative result may occur with  improper specimen collection/handling, submission of specimen other than nasopharyngeal swab, presence of viral mutation(s) within the areas targeted by this assay, and inadequate number of viral copies(<138 copies/mL). A negative result must be combined  with clinical observations, patient history, and epidemiological information. The expected result is Negative.  Fact Sheet for Patients:  EntrepreneurPulse.com.au  Fact Sheet for Healthcare Providers:  IncredibleEmployment.be  This test is no t yet approved or cleared by the [place] FDA and  has been authorized for detection and/or diagnosis of SARS-CoV-2 by FDA under an Emergency Use Authorization (EUA). This EUA will remain  in effect (meaning this test can be used) for the duration of the COVID-19 declaration under Section 564(b)(1) of the Act, 21 U.S.C.section 360bbb-3(b)(1), unless the authorization is terminated  or revoked sooner.       Influenza A by PCR NEGATIVE NEGATIVE Final   Influenza B by PCR NEGATIVE NEGATIVE Final    Comment: (NOTE) The Xpert Xpress SARS-CoV-2/FLU/RSV plus assay is intended as an aid in the diagnosis of influenza from Nasopharyngeal swab specimens and should not be used as a sole basis for treatment. Nasal washings and aspirates are unacceptable for Xpert Xpress SARS-CoV-2/FLU/RSV testing.  Fact Sheet for Patients: EntrepreneurPulse.com.au  Fact Sheet for Healthcare Providers: IncredibleEmployment.be  This test is not yet approved or cleared by the [place] FDA and has been authorized for detection and/or diagnosis of SARS-CoV-2 by FDA under an Emergency Use Authorization (EUA). This EUA will remain in effect (meaning this test can be used) for the duration of the COVID-19 declaration under Section 564(b)(1) of the Act, 21 U.S.C. section 360bbb-3(b)(1), unless the authorization is terminated or revoked.  Performed at [facility], [place] [address], [place], [place] [address]   Culture, Urine     Status: Abnormal   Collection Time: 12/23/20  7:26 AM   Specimen: Urine, Random  Result Value Ref Range Status   Specimen Description URINE, RANDOM  Final    Special Requests   Final    NONE Performed at [facility], [place] [address], [place], [place] [address]    Culture >=100,000 COLONIES/mL KLEBSIELLA PNEUMONIAE (A)  Final   Report Status 12/25/2020 FINAL  Final   Organism ID, Bacteria KLEBSIELLA PNEUMONIAE (A)  Final      Susceptibility   Klebsiella pneumoniae - MIC*    AMPICILLIN >=32 RESISTANT Resistant     CEFAZOLIN <=4 SENSITIVE Sensitive  CEFEPIME <=0.12 SENSITIVE Sensitive     CEFTRIAXONE <=0.25 SENSITIVE Sensitive     CIPROFLOXACIN <=0.25 SENSITIVE Sensitive     GENTAMICIN <=1 SENSITIVE Sensitive     IMIPENEM <=0.25 SENSITIVE Sensitive     NITROFURANTOIN 64 INTERMEDIATE Intermediate     TRIMETH/SULFA <=20 SENSITIVE Sensitive     AMPICILLIN/SULBACTAM 4 SENSITIVE Sensitive     PIP/TAZO <=4 SENSITIVE Sensitive     * >=100,000 COLONIES/mL KLEBSIELLA PNEUMONIAE      Radiology Studies: No results found.  Scheduled Meds: . atorvastatin  40 mg Oral Daily  . clopidogrel  75 mg Oral Daily  . heparin injection (subcutaneous)  5,000 Units Subcutaneous Q8H  . levothyroxine  100 mcg Oral QAC breakfast  . lisinopril  40 mg Oral Daily  . metoprolol succinate  50 mg Oral Daily   Continuous Infusions: . cefTRIAXone (ROCEPHIN)  IV 1 g (12/25/20 0831)  . lactated ringers 75 mL/hr at 12/25/20 0150     LOS: 3 days   Time spent: 25 minutes.  Patrecia Pour, MD [facility] [email or web address] 12/25/2020, 12:53 PM

## 2020-12-25 NOTE — Care Management Important Message (Signed)
Important Message  Patient Details  Name: Paula Haynes MRN: [id number] Date of Birth: 1936-01-08   Medicare Important Message Given:  Yes     Jalal Rauch [address] 12/25/2020, 2:53 PM

## 2020-12-26 ENCOUNTER — Inpatient Hospital Stay: Payer: PPO | Admitting: Adult Health

## 2020-12-26 ENCOUNTER — Other Ambulatory Visit (HOSPITAL_COMMUNITY): Payer: Self-pay

## 2020-12-26 MED ORDER — AMLODIPINE BESYLATE 10 MG PO TABS
10.0000 mg | ORAL_TABLET | Freq: Every day | ORAL | 0 refills | Status: DC
  Filled 2020-12-26: qty 30, 30d supply, fill #0

## 2020-12-26 MED ORDER — CEFDINIR 300 MG PO CAPS
300.0000 mg | ORAL_CAPSULE | Freq: Two times a day (BID) | ORAL | 0 refills | Status: AC
  Filled 2020-12-26: qty 6, 3d supply, fill #0

## 2020-12-26 MED ORDER — HYDRALAZINE HCL 20 MG/ML IJ SOLN
10.0000 mg | INTRAMUSCULAR | Status: DC | PRN
  Administered 2020-12-26: 10 mg via INTRAVENOUS
  Filled 2020-12-26: qty 1

## 2020-12-26 MED ORDER — LABETALOL HCL 5 MG/ML IV SOLN
20.0000 mg | INTRAVENOUS | Status: DC | PRN
  Administered 2020-12-26: 20 mg via INTRAVENOUS
  Filled 2020-12-26: qty 4

## 2020-12-26 NOTE — Progress Notes (Signed)
D/c instructions provide. Awaiting family.

## 2020-12-26 NOTE — Progress Notes (Signed)
Physical Therapy Treatment Patient Details Name: Paula Haynes MRN: [id number] DOB: [birth year] Today's Date: 12/26/2020    History of Present Illness 85 y.o. female presenting to ED on 5/28 with AMS after being found down at home for at least 48 hours. Admitted with acute metabolic encephalopathy, rhabdo and UTI. XR R hip suggestive of R superior pubic ramus irregularity. PMHx significant for acute punctate CVA in R lateral thalamus in 10/2020 without residual deficits, HTN, thyroid disease, CKD IIIb and anemia.    PT Comments    Pt has made good progress toward goals.  Still not fully independent and family should provide needed help/supervision for a short time until pt can make safe decisions.   Follow Up Recommendations  Home health PT;Other (comment);Supervision/Assistance - 24 hour (initially up to 24 hours as needed for safety)     Equipment Recommendations  None recommended by PT;Other (comment) (have family get a SPC from [place] or drug store for use in the short term.)    Recommendations for Other Services       Precautions / Restrictions Precautions Precautions: Fall    Mobility  Bed Mobility Overal bed mobility: Needs Assistance Bed Mobility: Rolling;Sidelying to Sit Rolling: Modified independent (Device/Increase time) Sidelying to sit: Supervision       General bed mobility comments: Supervision A for safety.    Transfers Overall transfer level: Needs assistance Equipment used: 1 person hand held assist Transfers: Sit to/from Stand Sit to Stand: Min assist         General transfer comment: supervision for safety  Ambulation/Gait Ambulation/Gait assistance: Min guard Gait Distance (Feet): 300 Feet Assistive device: None;Straight cane;Rolling walker (2 wheeled) Gait Pattern/deviations: Step-through pattern   Gait velocity interpretation: <1.8 ft/sec, indicate of risk for recurrent falls General Gait Details: still mildly unsteady. Most steady with  RW, but likely wouldn't use it.  Used the [facility] safely, but could use more reinforcement.  Pt realized in the moment, that it was helpful in increasing stability.  Without an AD, pt able to ambulate with min guard, mildly unsteady and guarded.   Stairs Stairs: Yes Stairs assistance: Min guard Stair Management: One rail Right;With cane Number of Stairs: 12 General stair comments: safe with rail and guard assist, steady with both ascent and descent.   Wheelchair Mobility    Modified Rankin (Stroke Patients Only)       Balance Overall balance assessment: Needs assistance Sitting-balance support: Single extremity supported;Bilateral upper extremity supported;Feet supported Sitting balance-Leahy Scale: Fair     Standing balance support: Single extremity supported Standing balance-Leahy Scale: Fair Standing balance comment: able to balance tentatively                            Cognition Arousal/Alertness: Awake/alert Behavior During Therapy: Restless Overall Cognitive Status: Impaired/Different from baseline Area of Impairment: Orientation;Memory;Safety/judgement;Awareness;Problem solving                 Orientation Level: Place;Time;Situation   Memory: Decreased short-term memory   Safety/Judgement: Decreased awareness of safety Awareness: Emergent Problem Solving: Slow processing General Comments: pt's mentation has improved, but not likely at baseline, not able to make fully sound/safe decisions without some monitoring, in my opinion.      Exercises      General Comments        Pertinent Vitals/Pain Pain Assessment: No/denies pain    Home Living Family/patient expects to be discharged to:: Private residence Living Arrangements: Alone Available Help  at Discharge: Family;Available 24 hours/day Type of Home: House Home Access: Stairs to enter Entrance Stairs-Rails: Right Home Layout: Multi-level Home Equipment: Grab bars - toilet;Grab bars -  tub/shower;Shower seat;Cane - single point Additional Comments: Patient currently in the process of moving to a home beside her son Remo Lipps.    Prior Function Level of Independence: Independent;Independent with assistive device(s)      Comments: Uses shower seat for bathing; independent with IADLs; drives   PT Goals (current goals can now be found in the care plan section) Acute Rehab PT Goals Patient Stated Goal: No goals stated PT Goal Formulation: Patient unable to participate in goal setting Time For Goal Achievement: 12/31/20 Potential to Achieve Goals: Good    Frequency    Min 3X/week      PT Plan Current plan remains appropriate    Co-evaluation              AM-PAC PT "6 Clicks" Mobility   Outcome Measure  Help needed turning from your back to your side while in a flat bed without using bedrails?: A Little Help needed moving from lying on your back to sitting on the side of a flat bed without using bedrails?: A Little Help needed moving to and from a bed to a chair (including a wheelchair)?: A Little Help needed standing up from a chair using your arms (e.g., wheelchair or bedside chair)?: A Little Help needed to walk in hospital room?: A Little Help needed climbing 3-5 steps with a railing? : A Little 6 Click Score: 18    End of Session   Activity Tolerance: Patient tolerated treatment well Patient left: in chair;with call bell/phone within reach;with chair alarm set Nurse Communication: Mobility status PT Visit Diagnosis: Unsteadiness on feet (R26.81);Difficulty in walking, not elsewhere classified (R26.2)     Time: 1100-1145 PT Time Calculation (min) (ACUTE ONLY): 45 min  Charges:  $Gait Training: 8-22 mins $Therapeutic Activity: 23-37 mins                     12/26/2020  Ginger Carne., PT Acute Rehabilitation Services [phone]  (pager) [phone]  (office)   Paula Haynes 12/26/2020, 12:06 PM

## 2020-12-26 NOTE — TOC Initial Note (Signed)
Transition of Care [facility]) - Initial/Assessment Note    Patient Details  Name: Paula Haynes MRN: [id number] Date of Birth: 07-11-1936  Transition of Care [facility]) CM/SW Contact:    Joanne Chars, LCSW Phone Number: 12/26/2020, 10:39 AM  Clinical Narrative:   CSW met with pt regarding recommendation for [facility].  Pt engaged in conversation appropriately.  Pt agreeable to this, choice document given, no preference indicated.  Permission given to speak with granddaughter [place] (healthcare POA) and son Annie Main.  Pt will not be returning to the address listed in epic, will be going to a different home in [place].  Current equipment in home: shower bench only.  PCP in place. Pt is vaccinated for covid but not boosted.  CSW spoke with granddaughter [place].  She confirms that she and Annie Main are working on arranging for 24/7 supervision, she had questions about how much [place] would be able to assist with this, discussed that [facility] will be there for short periods of time, not available for staying in home for long stretches.  She provided address pt will be going to: [address] [place], [place] [address].                  Expected Discharge Plan: [place] Barriers to Discharge: No Barriers Identified   Patient Goals and CMS Choice Patient states their goals for this hospitalization and ongoing recovery are:: "go home today" CMS Medicare.gov Compare Post Acute Care list provided to:: Patient Choice offered to / list presented to : Patient  Expected Discharge Plan and Services Expected Discharge Plan: [place] Choice: Home arrangements for the past 2 months: Single Family Home                           HH Arranged: PT,OT [place]: [place] Date [facility] Agency Contacted: 12/26/20 Time [place]: [id number] Representative spoke with at [place]: Tommi Rumps  Prior Living Arrangements/Services Living arrangements  for the past 2 months: [place] with:: Self Patient language and need for interpreter reviewed:: Yes Do you feel safe going back to the place where you live?: Yes      Need for Family Participation in Patient Care: Yes (Comment) Care giver support system in place?: Yes (comment) Current home services: Other (comment) (none) Criminal Activity/Legal Involvement Pertinent to Current Situation/Hospitalization: No - Comment as needed  Activities of Daily Living      Permission Sought/Granted Permission sought to share information with : Family Supports Permission granted to share information with : Yes, Verbal Permission Granted  Share Information with NAME: medical POA granddaughter [place], son Annie Main  Permission granted to share info w AGENCY: HH        Emotional Assessment Appearance:: Appears stated age Attitude/Demeanor/Rapport: Engaged Affect (typically observed): Appropriate,Pleasant Orientation: : Oriented to Self,Oriented to  Time,Oriented to Situation Alcohol / Substance Use: Not Applicable Psych Involvement: No (comment)  Admission diagnosis:  Confusion [R41.0] Acute encephalopathy [G93.40] Chest pain [R07.9] Traumatic rhabdomyolysis, initial encounter ([place]) [T79.6XXA] Patient Active Problem List   Diagnosis Date Noted  . Rhabdomyolysis 12/23/2020  . Acute hyponatremia 12/23/2020  . Right hip pain 12/23/2020  . Chronic diastolic CHF (congestive heart failure) ([place]) 12/23/2020  . Acute encephalopathy 12/22/2020  . History of stroke 11/30/2020  . Mild aortic stenosis 11/30/2020  . Hyperlipidemia LDL goal <70 11/29/2020  .  Acute CVA (cerebrovascular accident) ([place]) 11/14/2020  . Vitamin D deficiency 09/26/2020  . Postoperative hypothyroidism 09/26/2020  . Elevated immunoglobulin A & G 03/29/2019  . Thrombocytopenia ([place]) 07/26/2018  . Lymphocytopenia 07/26/2018  . Anemia, chronic disease 07/23/2018  . Osteoporosis 11/16/2017  . CKD (chronic  kidney disease) stage 3, GFR 30-59 ml/min (HCC) 03/19/2016  . Varicose veins of lower extremities with other complications 50/35/4656  . Essential hypertension 10/09/2008   PCP:  Ma Hillock, DO Pharmacy:   [facility] [place], [place] - [address] [place] [address] [place] [address] Phone: [phone] Fax: [phone]     Social Determinants of Health (SDOH) Interventions    Readmission Risk Interventions No flowsheet data found.

## 2020-12-26 NOTE — Progress Notes (Signed)
Labetalol x1 prn dose given, bp recheck reveals systolic bp >694. Physician notified.

## 2020-12-26 NOTE — TOC Transition Note (Signed)
Transition of Care [facility]) - CM/SW Discharge Note   Patient Details  Name: Paula Haynes MRN: [id number] Date of Birth: April 17, 1936  Transition of Care [facility]) CM/SW Contact:  Joanne Chars, LCSW Phone Number: 12/26/2020, 2:02 PM   Clinical Narrative:   Pt discharging with [facility].  Pt will be staying next door to her son and daughter in law in [place] rather than returning to her [place] home.  Granddaugher and daughter in law present in room to transport home.     Final next level of care: [place] Barriers to Discharge: No Barriers Identified   Patient Goals and CMS Choice Patient states their goals for this hospitalization and ongoing recovery are:: "go home today" CMS Medicare.gov Compare Post Acute Care list provided to:: Patient Choice offered to / list presented to : Patient  Discharge Placement                  Name of family member notified: granddaughter Paula Haynes, daughter in law Paula Haynes in room Patient and family notified of of transfer: 12/26/20  Discharge Plan and Services     Post Acute Care Choice: Home Health          DME Arranged: N/A         HH Arranged: PT,OT [place] Agency: [place] Date [facility] Agency Contacted: 12/26/20 Time [place]: 0037 Representative spoke with at [place]: [place] ([place]) Interventions     Readmission Risk Interventions No flowsheet data found.

## 2020-12-26 NOTE — Progress Notes (Signed)
Occupational Therapy Treatment Patient Details Name: Paula Haynes MRN: [id number] DOB: [birth year] Today's Date: 12/26/2020    History of present illness 85 y.o. female presenting to ED on 5/28 with AMS after being found down at home for at least 48 hours. Admitted with acute metabolic encephalopathy, rhabdo and UTI. XR R hip suggestive of R superior pubic ramus irregularity. PMHx significant for acute punctate CVA in R lateral thalamus in 10/2020 without residual deficits, HTN, thyroid disease, CKD IIIb and anemia.   OT comments  Patient progressing toward goals. Family present at time of treatment session note patient functioning (both cognitively and physically) better than at time of last visit 2 days ago. Patient A&Ox4 this session and completing ADLs in standing with Min guard to supervision A overall without AD. Patient/family education on safety with ADLs/IADLs upon return home. Recommendation for assist with money management and medication management upon return home. OT will continue to follow acutely.    Follow Up Recommendations  No OT follow up    Equipment Recommendations  None recommended by OT    Recommendations for Other Services      Precautions / Restrictions Precautions Precautions: Fall Restrictions Weight Bearing Restrictions: No       Mobility Bed Mobility Overal bed mobility: Needs Assistance Bed Mobility: Rolling;Sidelying to Sit Rolling: Modified independent (Device/Increase time) Sidelying to sit: Supervision       General bed mobility comments: Patient seated in recliner upon entry.    Transfers Overall transfer level: Needs assistance Equipment used: 1 person hand held assist Transfers: Sit to/from Stand Sit to Stand: Supervision         General transfer comment: supervision for safety    Balance Overall balance assessment: Needs assistance Sitting-balance support: Single extremity supported;Bilateral upper extremity supported;Feet  supported Sitting balance-Leahy Scale: Fair     Standing balance support: Single extremity supported Standing balance-Leahy Scale: Fair Standing balance comment: Maintains static standing balance at sink with and without unilateral UE support on sink surface.                           ADL either performed or assessed with clinical judgement   ADL Overall ADL's : Needs assistance/impaired     Grooming: Supervision/safety;Standing Grooming Details (indicate cue type and reason): 3/3 grooming tasks standing at sink level with supervision A for safety. Tolerates standing >10 min without need for rest break.                 Toilet Transfer: Magazine features editor Details (indicate cue type and reason): Min guard without AD. Patient declined use of RW. Toileting- Clothing Manipulation and Hygiene: Supervision/safety;Min guard Toileting - Clothing Manipulation Details (indicate cue type and reason): Supervision A to Min guard for safety.       General ADL Comments: Patient grossly Min guard to supervision A for ADLs grossly.     Vision       Perception     Praxis      Cognition Arousal/Alertness: Awake/alert Behavior During Therapy: WFL for tasks assessed/performed Overall Cognitive Status: Within Functional Limits for tasks assessed Area of Impairment: Orientation;Memory;Safety/judgement;Awareness;Problem solving                 Orientation Level: Place;Time;Situation   Memory: Decreased short-term memory   Safety/Judgement: Decreased awareness of safety Awareness: Emergent Problem Solving: Slow processing General Comments: Patient A&Ox4. Family present at bedside report patient closer to her cognitive baseline.  Exercises     Shoulder Instructions       General Comments VSS on RA. Mild dyspnea with long-distance mobility.    Pertinent Vitals/ Pain       Pain Assessment: No/denies pain  Home Living Family/patient expects to be  discharged to:: Private residence Living Arrangements: Alone Available Help at Discharge: Family;Available 24 hours/day Type of Home: House Home Access: Stairs to enter [employer] of Steps: 2 flights Entrance Stairs-Rails: Right Home Layout: Multi-level     Bathroom Shower/Tub: Walk-in shower;Tub/shower unit   Bathroom Toilet: Standard     Home Equipment: Grab bars - toilet;Grab bars - tub/shower;Shower seat;Cane - single point   Additional Comments: Patient currently in the process of moving to a home beside her son Remo Lipps.      Prior Functioning/Environment Level of Independence: Independent;Independent with assistive device(s)        Comments: Uses shower seat for bathing; independent with IADLs; drives   Frequency  Min 2X/week        Progress Toward Goals  OT Goals(current goals can now be found in the care plan section)  Progress towards OT goals: Progressing toward goals  Acute Rehab OT Goals Patient Stated Goal: To return home. OT Goal Formulation: With patient/family Time For Goal Achievement: 01/07/21 Potential to Achieve Goals: Good ADL Goals Pt Will Perform Grooming: with supervision;standing Pt Will Perform Upper Body Dressing: with supervision;standing Pt Will Perform Lower Body Dressing: with supervision;sit to/from stand Pt Will Transfer to Toilet: with supervision;ambulating Pt Will Perform Toileting - Clothing Manipulation and hygiene: with supervision;sit to/from stand Pt Will Perform Tub/Shower Transfer: with supervision;ambulating  Plan Discharge plan needs to be updated;Frequency remains appropriate    Co-evaluation                 AM-PAC OT "6 Clicks" Daily Activity     Outcome Measure   Help from another person eating meals?: None Help from another person taking care of personal grooming?: A Little Help from another person toileting, which includes using toliet, bedpan, or urinal?: A Little Help from another  person bathing (including washing, rinsing, drying)?: A Lot Help from another person to put on and taking off regular upper body clothing?: A Little Help from another person to put on and taking off regular lower body clothing?: A Little 6 Click Score: 18    End of Session Equipment Utilized During Treatment: Gait belt  OT Visit Diagnosis: Unsteadiness on feet (R26.81);Muscle weakness (generalized) (M62.81);History of falling (Z91.81)   Activity Tolerance Patient tolerated treatment well   Patient Left in chair;with call bell/phone within reach;with chair alarm set;with family/visitor present   Nurse Communication Mobility status        Time: 2025-4270 OT Time Calculation (min): 24 min  Charges: OT General Charges $OT Visit: 1 Visit OT Treatments $Self Care/Home Management : 23-37 mins  Moreen Piggott H. OTR/L Supplemental OT, Department of rehab services [phone]   Xiomar Crompton R H. 12/26/2020, 2:09 PM

## 2020-12-26 NOTE — Discharge Summary (Signed)
Physician Discharge Summary  Paula Haynes VFI:[id number] DOB: Jun 15, 1936 DOA: 12/22/2020  PCP: Ma Hillock, DO  Admit date: 12/22/2020 Discharge date: 12/26/2020  Admitted From: Home Disposition:  home  Recommendations for Outpatient Follow-up:  1. Follow up with PCP in 1-2 weeks  Home Health:PT, OT   Discharge Condition:Improved CODE STATUS:Full Diet recommendation: Heart healthy   Brief/Interim Summary: 85 y.o. female with a history of CVA April 2022, HFpEF, HTN, stage IIIb CKD, HLD, hypothyroidism who presented to the ED 5/28 after patient was found down, confused, at home for uncertain period of time. In the ED she was confused, confirmed to be off mental baseline, afebrile with CT head and cervical spine negative for acute abnormalities, hyponatremia (Na 130), CK 1,045, UA strongly suggestive of UTI for which ceftriaxone was initially started along with IV fluids for rhabdomyolysis  Discharge Diagnoses:  Principal Problem:   Acute encephalopathy Active Problems:   Essential hypertension   CKD (chronic kidney disease) stage 3, GFR 30-59 ml/min (HCC)   Rhabdomyolysis   Acute hyponatremia   Right hip pain   Chronic diastolic CHF (congestive heart failure) (HCC)  Acute metabolic encephalopathy: Pt's mentation has improved, appropriately conversant  UTI:  - Ceftriaxone 1g IV q24h - discharged on omnicef to complete course  Rhabdomyolysis:  - CK has sustained improvement.  - Found down: PT and OT evaluations performed, recommending 24 hour supervision at home.  History of CVA: April 2022.  - Has completed 3 weeks of DAPT, continue plavix and statin - Note pt has 30-day cardiac event monitor in place, a note in EMR reports a run of SVT up to 160bpm on 5/6. I am unable to access the actual strip, though she was changed from norvasc to metoprolol by Dr. Geraldo Pitter for this.   Chronic HFpEF, HTN: Echo 11/15/2020 LVEF 55-60%, G1DD, no WMA, mild AS.   - Continue lisinopril,  metoprolol. BP severely elevated. Will add norvasc which the patient took in the past.   Stage IIIb CKD: Stable, may be progressing to stage IV.  - Avoid nephrotoxins. SCr has continued improvement on IVF. Pt taking adequate po, so stopped IVF  Right superior pubic ramus cortical irregularity: On XR, not seen on motion-degraded CT. Pt denies pain currently.  - Defer further work up. Pt does not report pain in this region now.  Hypothyroidism: TSH 11.790, though free T4 wnl at 1.06. Pt's granddaughter/POA suspects the patient takes all prescribed medications reliably prior to this episode.  - Recheck TFTs in 4-6 weeks recommended.   Hypokalemia:  - Resolved with supplementation.  Thrombocytopenia:  - Resolved spontaneously.  AST elevation: Due to rhabdo most likely. Resolved.  HLD:  - Continue statin as above   Discharge Instructions   Allergies as of 12/26/2020   No Known Allergies     Medication List    TAKE these medications   amLODipine 10 MG tablet Commonly known as: NORVASC Take 1 tablet (10 mg total) by mouth daily. Start taking on: December 27, 2020   aspirin 81 MG tablet Take 1 tablet (81 mg total) by mouth at bedtime. Stop taking after 21days   atorvastatin 40 MG tablet Commonly known as: LIPITOR Take 1 tablet (40 mg total) by mouth daily.   Blood Pressure Kit Devi 1 Device by Does not apply route daily. Large cuff   cefdinir 300 MG capsule Commonly known as: OMNICEF Take 1 capsule (300 mg total) by mouth 2 (two) times daily for 3 days.   clopidogrel 75  MG tablet Commonly known as: PLAVIX Take 1 tablet (75 mg total) by mouth daily.   lisinopril 40 MG tablet Commonly known as: ZESTRIL Take 40 mg by mouth daily.   metoprolol succinate 50 MG 24 hr tablet Commonly known as: TOPROL-XL Take 1 tablet (50 mg total) by mouth daily. Take with or immediately following a meal.   Omega-3 1000 MG Caps Take 1,000 mg by mouth daily.   Opcon-A 0.027-0.315 %  Soln Generic drug: Naphazoline-Pheniramine Place 1-2 drops into both eyes 4 (four) times daily as needed (for seasonal allergies).   Prolia 60 MG/ML Sosy injection Generic drug: denosumab Inject into the skin every 6 (six) months.   sodium bicarbonate 650 MG tablet Take 1,300 mg by mouth 2 (two) times daily.   Synthroid 100 MCG tablet Generic drug: levothyroxine Take 100 mcg by mouth daily before breakfast.   Vitamin D3 125 MCG (5000 UT) Caps Take 5,000 Units by mouth at bedtime.       Follow-up Information    Kuneff, Renee A, DO. Schedule an appointment as soon as possible for a visit in 2 week(s).   Specialty: Family Medicine Contact information: [address] [place] [address] [phone]              No Known Allergies   Procedures/Studies: CT Head Wo Contrast  Result Date: 12/22/2020 CLINICAL DATA:  Mental status change. EXAM: CT HEAD WITHOUT CONTRAST TECHNIQUE: Contiguous axial images were obtained from the base of the skull through the vertex without intravenous contrast. COMPARISON:  November 15, 2020 FINDINGS: Brain: No evidence of acute infarction, hemorrhage, hydrocephalus, extra-axial collection or mass lesion/mass effect. Extensive small vessel ischemic changes throughout the deep white matter. This small right thalamic infarct seen by MRI dated April 2022 is not clearly visualized, likely obscured by microvascular changes. Vascular: Calcific atherosclerotic disease of the intra cavernous carotid arteries. Skull: Normal. Negative for fracture or focal lesion. Sinuses/Orbits: No acute finding. Other: None. IMPRESSION: 1. No acute intracranial abnormality. 2. Extensive small vessel ischemic changes throughout the deep white matter. 3. The small right thalamic infarct seen by MRI dated April 2022 is not clearly visualized, likely obscured by microvascular changes. Electronically Signed   By: Fidela Salisbury M.D.   On: 12/22/2020 19:24   CT Cervical Spine Wo  Contrast  Result Date: 12/22/2020 CLINICAL DATA:  85 year old female with neck trauma. EXAM: CT CERVICAL SPINE WITHOUT CONTRAST TECHNIQUE: Multidetector CT imaging of the cervical spine was performed without intravenous contrast. Multiplanar CT image reconstructions were also generated. COMPARISON:  CT angiography dated 11/15/2020. FINDINGS: Alignment: No acute subluxation Skull base and vertebrae: No acute fracture. Osteopenia. Soft tissues and spinal canal: No prevertebral fluid or swelling. No visible canal hematoma. Disc levels: Multilevel degenerative changes with disc space narrowing and endplate irregularity and spurring. Upper chest: Negative. Other: Bilateral carotid bulb calcified plaques. IMPRESSION: 1. No acute/traumatic cervical spine pathology. 2. Multilevel degenerative changes. Electronically Signed   By: Anner Crete M.D.   On: 12/22/2020 20:48   CT PELVIS WO CONTRAST  Result Date: 12/22/2020 CLINICAL DATA:  Pelvic trauma EXAM: CT PELVIS WITHOUT CONTRAST TECHNIQUE: Multidetector CT imaging of the pelvis was performed following the standard protocol without intravenous contrast. COMPARISON:  12/22/2020 FINDINGS: Evaluation is limited by patient motion throughout the exam. Urinary Tract:  No urinary tract calculi.  Bladder is unremarkable. Bowel: Diverticulosis of the distal colon without diverticulitis. No evidence of obstruction or ileus. Vascular/Lymphatic: Diffuse atherosclerosis. No pathologic adenopathy. Reproductive: Calcified uterine fibroid.  The uterus is atrophic. No adnexal masses. Other:  No free fluid or free gas.  No abdominal wall hernia. Musculoskeletal: Patient motion results in significant artifact through the lower pelvis, particularly through the level of the right superior pubic ramus or possible fracture was seen on radiograph. No definite fractures are identified on the provided images. Prominent degenerative changes are seen within the lower lumbar spine.  Reconstructed images demonstrate no additional findings. IMPRESSION: 1. Extensive patient motion through the lower pelvis limits evaluation. Specifically, there is extensive motion artifact through the level of the right superior pubic ramus where a possible abnormality was seen on radiograph. 2. No definite displaced fractures are identified. However, if fracture remains a concern the patient could be brought back for repeat imaging through the lower pelvis and addendum to this report provided. 3. Distal colonic diverticulosis without diverticulitis. 4.  Aortic Atherosclerosis (ICD10-I70.0). Electronically Signed   By: Randa Ngo M.D.   On: 12/22/2020 20:50   DG Chest Port 1 View  Result Date: 12/22/2020 CLINICAL DATA:  85 year old female with chest pain. EXAM: PORTABLE CHEST 1 VIEW COMPARISON:  None. FINDINGS: No focal consolidation, pleural effusion or pneumothorax. The cardiac silhouette is within limits. Atherosclerotic calcification of the aorta. No acute osseous pathology. Degenerative changes of the spine. IMPRESSION: No active disease. Electronically Signed   By: Anner Crete M.D.   On: 12/22/2020 22:20   DG Hip Unilat With Pelvis 2-3 Views Right  Result Date: 12/22/2020 CLINICAL DATA:  Weakness, altered level of consciousness EXAM: DG HIP (WITH OR WITHOUT PELVIS) 2-3V RIGHT COMPARISON:  None. FINDINGS: Frontal view of the pelvis as well as frontal and frogleg lateral views of the right hip are obtained. There is subtle cortical irregularity within the right superior pubic ramus near the junction with the symphysis, which could reflect a minimally displaced fracture. If pain is referable to this region, CT scan may be useful for confirmation. No other acute bony abnormalities are identified. Alignment is anatomic. Mild symmetrical bilateral hip osteoarthritis. Prominent lower lumbar spondylosis and facet hypertrophy. Calcification left hemipelvis most consistent with degenerating fibroid.  IMPRESSION: 1. Findings suspicious for a minimally displaced right superior pubic ramus fracture. Follow-up CT scan may be useful for confirmation. 2. Mild symmetrical bilateral hip osteoarthritis. Electronically Signed   By: Randa Ngo M.D.   On: 12/22/2020 19:06     Subjective: Eager to go home  Discharge Exam: Vitals:   12/26/20 1016 12/26/20 1310  BP: (!) 177/85 (!) 152/76  Pulse: 62 79  Resp:    Temp:    SpO2:     Vitals:   12/26/20 0508 12/26/20 0815 12/26/20 1016 12/26/20 1310  BP: (!) 166/83 (!) 186/84 (!) 177/85 (!) 152/76  Pulse: 70 78 62 79  Resp: 17 16    Temp: 98.2 F (36.8 C) (!) 96.5 F (35.8 C)    TempSrc:  Axillary    SpO2: 99% 100%    Weight:        General: Pt is alert, awake, not in acute distress Cardiovascular: RRR, S1/S2 + Respiratory: CTA bilaterally, no wheezing, no rhonchi Abdominal: Soft, NT, ND, bowel sounds + Extremities: no edema, no cyanosis   The results of significant diagnostics from this hospitalization (including imaging, microbiology, ancillary and laboratory) are listed below for reference.     Microbiology: Recent Results (from the past 240 hour(s))  Resp Panel by RT-PCR (Flu A&B, Covid) Nasopharyngeal Swab     Status: None   Collection Time: 12/22/20  6:41  PM   Specimen: Nasopharyngeal Swab; Nasopharyngeal(NP) swabs in vial transport medium  Result Value Ref Range Status   SARS Coronavirus 2 by RT PCR NEGATIVE NEGATIVE Final    Comment: (NOTE) SARS-CoV-2 target nucleic acids are NOT DETECTED.  The SARS-CoV-2 RNA is generally detectable in upper respiratory specimens during the acute phase of infection. The lowest concentration of SARS-CoV-2 viral copies this assay can detect is 138 copies/mL. A negative result does not preclude SARS-Cov-2 infection and should not be used as the sole basis for treatment or other patient management decisions. A negative result may occur with  improper specimen collection/handling,  submission of specimen other than nasopharyngeal swab, presence of viral mutation(s) within the areas targeted by this assay, and inadequate number of viral copies(<138 copies/mL). A negative result must be combined with clinical observations, patient history, and epidemiological information. The expected result is Negative.  Fact Sheet for Patients:  EntrepreneurPulse.com.au  Fact Sheet for Healthcare Providers:  IncredibleEmployment.be  This test is no t yet approved or cleared by the [place] FDA and  has been authorized for detection and/or diagnosis of SARS-CoV-2 by FDA under an Emergency Use Authorization (EUA). This EUA will remain  in effect (meaning this test can be used) for the duration of the COVID-19 declaration under Section 564(b)(1) of the Act, 21 U.S.C.section 360bbb-3(b)(1), unless the authorization is terminated  or revoked sooner.       Influenza A by PCR NEGATIVE NEGATIVE Final   Influenza B by PCR NEGATIVE NEGATIVE Final    Comment: (NOTE) The Xpert Xpress SARS-CoV-2/FLU/RSV plus assay is intended as an aid in the diagnosis of influenza from Nasopharyngeal swab specimens and should not be used as a sole basis for treatment. Nasal washings and aspirates are unacceptable for Xpert Xpress SARS-CoV-2/FLU/RSV testing.  Fact Sheet for Patients: EntrepreneurPulse.com.au  Fact Sheet for Healthcare Providers: IncredibleEmployment.be  This test is not yet approved or cleared by the [place] FDA and has been authorized for detection and/or diagnosis of SARS-CoV-2 by FDA under an Emergency Use Authorization (EUA). This EUA will remain in effect (meaning this test can be used) for the duration of the COVID-19 declaration under Section 564(b)(1) of the Act, 21 U.S.C. section 360bbb-3(b)(1), unless the authorization is terminated or revoked.  Performed at [facility], [place] [address], [place], [place] [address]   Culture, Urine     Status: Abnormal   Collection Time: 12/23/20  7:26 AM   Specimen: Urine, Random  Result Value Ref Range Status   Specimen Description URINE, RANDOM  Final   Special Requests   Final    NONE Performed at [facility], [place] [address] [place], [place] [address]    Culture >=100,000 COLONIES/mL KLEBSIELLA PNEUMONIAE (A)  Final   Report Status 12/25/2020 FINAL  Final   Organism ID, Bacteria KLEBSIELLA PNEUMONIAE (A)  Final      Susceptibility   Klebsiella pneumoniae - MIC*    AMPICILLIN >=32 RESISTANT Resistant     CEFAZOLIN <=4 SENSITIVE Sensitive     CEFEPIME <=0.12 SENSITIVE Sensitive     CEFTRIAXONE <=0.25 SENSITIVE Sensitive     CIPROFLOXACIN <=0.25 SENSITIVE Sensitive     GENTAMICIN <=1 SENSITIVE Sensitive     IMIPENEM <=0.25 SENSITIVE Sensitive     NITROFURANTOIN 64 INTERMEDIATE Intermediate     TRIMETH/SULFA <=20 SENSITIVE Sensitive     AMPICILLIN/SULBACTAM 4 SENSITIVE Sensitive     PIP/TAZO <=4 SENSITIVE Sensitive     * >=100,000 COLONIES/mL KLEBSIELLA  PNEUMONIAE     Labs: BNP (last 3 results) No results for input(s): BNP in the last 8760 hours. Basic Metabolic Panel: Recent Labs  Lab 12/22/20 1749 12/23/20 0022 12/24/20 0107 12/25/20 0151  NA 130* 132* 136 137  K 4.5 3.4* 3.7 3.5  CL 94* 98 105 104  CO2 20* 21* 22 23  GLUCOSE 113* 111* 112* 127*  BUN 31* 28* 26* 24*  CREATININE 1.52* 1.38* 1.32* 1.17*  CALCIUM 9.0 8.5* 8.6* 8.3*  MG  --  1.7  --   --   PHOS  --  3.5  --   --    Liver Function Tests: Recent Labs  Lab 12/22/20 1749 12/23/20 0022 12/24/20 0107  AST 55* 50* 40  ALT '23 21 21  ' ALKPHOS 42 38 38  BILITOT 1.5* 1.1 0.6  PROT 7.3 7.1 6.0*  ALBUMIN 3.7 3.5 2.9*   No results for input(s): LIPASE, AMYLASE in the last 168 hours. Recent Labs  Lab 12/22/20 1749 12/23/20 0022  AMMONIA 35 20   CBC: Recent Labs  Lab 12/22/20 1749 12/23/20 0022 12/24/20 0107  WBC 7.1 7.6 6.5   NEUTROABS 5.5  --   --   HGB 12.2 11.6* 10.4*  HCT 37.2 34.6* 31.4*  MCV 92.3 89.9 91.3  PLT 129* 304 254   Cardiac Enzymes: Recent Labs  Lab 12/22/20 1749 12/23/20 0022 12/25/20 0151  CKTOTAL 1,045* 931* 245*   BNP: Invalid input(s): POCBNP CBG: Recent Labs  Lab 12/22/20 1814  GLUCAP 115*   D-Dimer No results for input(s): DDIMER in the last 72 hours. Hgb A1c No results for input(s): HGBA1C in the last 72 hours. Lipid Profile No results for input(s): CHOL, HDL, LDLCALC, TRIG, CHOLHDL, LDLDIRECT in the last 72 hours. Thyroid function studies No results for input(s): TSH, T4TOTAL, T3FREE, THYROIDAB in the last 72 hours.  Invalid input(s): FREET3 Anemia work up No results for input(s): VITAMINB12, FOLATE, FERRITIN, TIBC, IRON, RETICCTPCT in the last 72 hours. Urinalysis    Component Value Date/Time   COLORURINE YELLOW 12/23/2020 0552   APPEARANCEUR CLOUDY (A) 12/23/2020 0552   LABSPEC 1.012 12/23/2020 0552   PHURINE 6.0 12/23/2020 0552   GLUCOSEU NEGATIVE 12/23/2020 0552   HGBUR SMALL (A) 12/23/2020 0552   BILIRUBINUR NEGATIVE 12/23/2020 0552   KETONESUR NEGATIVE 12/23/2020 0552   PROTEINUR >=300 (A) 12/23/2020 0552   NITRITE NEGATIVE 12/23/2020 0552   LEUKOCYTESUR LARGE (A) 12/23/2020 0552   Sepsis Labs Invalid input(s): PROCALCITONIN,  WBC,  LACTICIDVEN Microbiology Recent Results (from the past 240 hour(s))  Resp Panel by RT-PCR (Flu A&B, Covid) Nasopharyngeal Swab     Status: None   Collection Time: 12/22/20  6:41 PM   Specimen: Nasopharyngeal Swab; Nasopharyngeal(NP) swabs in vial transport medium  Result Value Ref Range Status   SARS Coronavirus 2 by RT PCR NEGATIVE NEGATIVE Final    Comment: (NOTE) SARS-CoV-2 target nucleic acids are NOT DETECTED.  The SARS-CoV-2 RNA is generally detectable in upper respiratory specimens during the acute phase of infection. The lowest concentration of SARS-CoV-2 viral copies this assay can detect is 138  copies/mL. A negative result does not preclude SARS-Cov-2 infection and should not be used as the sole basis for treatment or other patient management decisions. A negative result may occur with  improper specimen collection/handling, submission of specimen other than nasopharyngeal swab, presence of viral mutation(s) within the areas targeted by this assay, and inadequate number of viral copies(<138 copies/mL). A negative result must be combined with clinical observations,  patient history, and epidemiological information. The expected result is Negative.  Fact Sheet for Patients:  EntrepreneurPulse.com.au  Fact Sheet for Healthcare Providers:  IncredibleEmployment.be  This test is no t yet approved or cleared by the [place] FDA and  has been authorized for detection and/or diagnosis of SARS-CoV-2 by FDA under an Emergency Use Authorization (EUA). This EUA will remain  in effect (meaning this test can be used) for the duration of the COVID-19 declaration under Section 564(b)(1) of the Act, 21 U.S.C.section 360bbb-3(b)(1), unless the authorization is terminated  or revoked sooner.       Influenza A by PCR NEGATIVE NEGATIVE Final   Influenza B by PCR NEGATIVE NEGATIVE Final    Comment: (NOTE) The Xpert Xpress SARS-CoV-2/FLU/RSV plus assay is intended as an aid in the diagnosis of influenza from Nasopharyngeal swab specimens and should not be used as a sole basis for treatment. Nasal washings and aspirates are unacceptable for Xpert Xpress SARS-CoV-2/FLU/RSV testing.  Fact Sheet for Patients: EntrepreneurPulse.com.au  Fact Sheet for Healthcare Providers: IncredibleEmployment.be  This test is not yet approved or cleared by the [place] FDA and has been authorized for detection and/or diagnosis of SARS-CoV-2 by FDA under an Emergency Use Authorization (EUA). This EUA will remain in effect (meaning  this test can be used) for the duration of the COVID-19 declaration under Section 564(b)(1) of the Act, 21 U.S.C. section 360bbb-3(b)(1), unless the authorization is terminated or revoked.  Performed at [facility], [place] [address], [place], [place] [address]   Culture, Urine     Status: Abnormal   Collection Time: 12/23/20  7:26 AM   Specimen: Urine, Random  Result Value Ref Range Status   Specimen Description URINE, RANDOM  Final   Special Requests   Final    NONE Performed at [facility], [place] [address], [place], [place] [address]    Culture >=100,000 COLONIES/mL KLEBSIELLA PNEUMONIAE (A)  Final   Report Status 12/25/2020 FINAL  Final   Organism ID, Bacteria KLEBSIELLA PNEUMONIAE (A)  Final      Susceptibility   Klebsiella pneumoniae - MIC*    AMPICILLIN >=32 RESISTANT Resistant     CEFAZOLIN <=4 SENSITIVE Sensitive     CEFEPIME <=0.12 SENSITIVE Sensitive     CEFTRIAXONE <=0.25 SENSITIVE Sensitive     CIPROFLOXACIN <=0.25 SENSITIVE Sensitive     GENTAMICIN <=1 SENSITIVE Sensitive     IMIPENEM <=0.25 SENSITIVE Sensitive     NITROFURANTOIN 64 INTERMEDIATE Intermediate     TRIMETH/SULFA <=20 SENSITIVE Sensitive     AMPICILLIN/SULBACTAM 4 SENSITIVE Sensitive     PIP/TAZO <=4 SENSITIVE Sensitive     * >=100,000 COLONIES/mL KLEBSIELLA PNEUMONIAE   Time spent: 30 min  SIGNED:   Marylu Lund, MD  [facility] 12/26/2020, 1:43 PM  If 7PM-7AM, please contact night-coverage

## 2020-12-27 ENCOUNTER — Encounter: Payer: Self-pay | Admitting: Family Medicine

## 2020-12-27 ENCOUNTER — Telehealth: Payer: Self-pay

## 2020-12-27 NOTE — Telephone Encounter (Signed)
Transition Care Management Unsuccessful Follow-up Telephone Call  Date of discharge and from where:  12/26/20 from [facility]  Attempts:  1st Attempt  Reason for unsuccessful TCM follow-up call:  Unable to reach patient

## 2020-12-29 DIAGNOSIS — Z9181 History of falling: Secondary | ICD-10-CM | POA: Diagnosis not present

## 2020-12-29 DIAGNOSIS — Z8673 Personal history of transient ischemic attack (TIA), and cerebral infarction without residual deficits: Secondary | ICD-10-CM | POA: Diagnosis not present

## 2020-12-29 DIAGNOSIS — I130 Hypertensive heart and chronic kidney disease with heart failure and stage 1 through stage 4 chronic kidney disease, or unspecified chronic kidney disease: Secondary | ICD-10-CM | POA: Diagnosis not present

## 2020-12-29 DIAGNOSIS — I5032 Chronic diastolic (congestive) heart failure: Secondary | ICD-10-CM | POA: Diagnosis not present

## 2020-12-29 DIAGNOSIS — M5416 Radiculopathy, lumbar region: Secondary | ICD-10-CM | POA: Diagnosis not present

## 2020-12-29 DIAGNOSIS — Z7902 Long term (current) use of antithrombotics/antiplatelets: Secondary | ICD-10-CM | POA: Diagnosis not present

## 2020-12-29 DIAGNOSIS — M160 Bilateral primary osteoarthritis of hip: Secondary | ICD-10-CM | POA: Diagnosis not present

## 2020-12-29 DIAGNOSIS — M4802 Spinal stenosis, cervical region: Secondary | ICD-10-CM | POA: Diagnosis not present

## 2020-12-29 DIAGNOSIS — G9340 Encephalopathy, unspecified: Secondary | ICD-10-CM | POA: Diagnosis not present

## 2020-12-29 DIAGNOSIS — I700 Atherosclerosis of aorta: Secondary | ICD-10-CM | POA: Diagnosis not present

## 2020-12-29 DIAGNOSIS — E871 Hypo-osmolality and hyponatremia: Secondary | ICD-10-CM | POA: Diagnosis not present

## 2020-12-29 DIAGNOSIS — Z87440 Personal history of urinary (tract) infections: Secondary | ICD-10-CM | POA: Diagnosis not present

## 2020-12-29 DIAGNOSIS — E039 Hypothyroidism, unspecified: Secondary | ICD-10-CM | POA: Diagnosis not present

## 2020-12-29 DIAGNOSIS — I080 Rheumatic disorders of both mitral and aortic valves: Secondary | ICD-10-CM | POA: Diagnosis not present

## 2020-12-29 DIAGNOSIS — I6782 Cerebral ischemia: Secondary | ICD-10-CM | POA: Diagnosis not present

## 2020-12-29 DIAGNOSIS — E785 Hyperlipidemia, unspecified: Secondary | ICD-10-CM | POA: Diagnosis not present

## 2020-12-29 DIAGNOSIS — E872 Acidosis: Secondary | ICD-10-CM | POA: Diagnosis not present

## 2020-12-29 DIAGNOSIS — M6282 Rhabdomyolysis: Secondary | ICD-10-CM | POA: Diagnosis not present

## 2020-12-29 DIAGNOSIS — N1832 Chronic kidney disease, stage 3b: Secondary | ICD-10-CM | POA: Diagnosis not present

## 2020-12-29 DIAGNOSIS — M47812 Spondylosis without myelopathy or radiculopathy, cervical region: Secondary | ICD-10-CM | POA: Diagnosis not present

## 2020-12-29 DIAGNOSIS — K5790 Diverticulosis of intestine, part unspecified, without perforation or abscess without bleeding: Secondary | ICD-10-CM | POA: Diagnosis not present

## 2020-12-29 LAB — METHYLMALONIC ACID, SERUM: Methylmalonic Acid, Quantitative: 859 nmol/L — ABNORMAL HIGH (ref 0–378)

## 2020-12-31 ENCOUNTER — Other Ambulatory Visit: Payer: Self-pay | Admitting: Physician Assistant

## 2020-12-31 ENCOUNTER — Telehealth: Payer: Self-pay

## 2020-12-31 DIAGNOSIS — I639 Cerebral infarction, unspecified: Secondary | ICD-10-CM

## 2020-12-31 DIAGNOSIS — I4891 Unspecified atrial fibrillation: Secondary | ICD-10-CM

## 2020-12-31 NOTE — Telephone Encounter (Signed)
Attempted to contact Paula Haynes. Phone disconnected.  Note: Need to know what are the exact orders for PT.

## 2020-12-31 NOTE — Telephone Encounter (Signed)
Transition Care Management Unsuccessful Follow-up Telephone Call  Date of discharge and from where:  12/26/20 from [facility]  Attempts:  2nd Attempt  Reason for unsuccessful TCM follow-up call:  Unable to reach patient. Phone disconnected.

## 2020-12-31 NOTE — Telephone Encounter (Signed)
Paula Haynes with Paula Haynes calling for PT orders for patient.  Please call [name] at [phone]

## 2021-01-01 NOTE — Telephone Encounter (Signed)
V/o given 

## 2021-01-01 NOTE — Telephone Encounter (Signed)
Called and spoke with Paula Haynes who is asking for v/o for exercise, gait, and balance 1x1 wk  2x1 wk  2x2  wk   Please advise

## 2021-01-01 NOTE — Telephone Encounter (Signed)
approved

## 2021-01-02 ENCOUNTER — Ambulatory Visit: Payer: PPO

## 2021-01-03 ENCOUNTER — Telehealth: Payer: Self-pay

## 2021-01-03 NOTE — Telephone Encounter (Signed)
Signed and returned to CMA work basket 

## 2021-01-03 NOTE — Telephone Encounter (Signed)
faxed

## 2021-01-03 NOTE — Telephone Encounter (Signed)
Home health orders received 01/02/21 for [place] [facility] health initiation orders: Yes.  Home health re-certification orders: No. Patient last seen by ordering physician for this condition: 11/27/20. Must be less than 90 days for re-certification and less than 30 days prior for initiation. Visit must have been for the condition the orders are being placed.  Patient meets criteria for Physician to sign orders: Yes.        Orders placed on physicians desk for signature: 01/03/21 (date) If patient does not meet criteria for orders to be signed: pt was called to schedule appt. Appt is scheduled for n/a.   [place]

## 2021-01-08 ENCOUNTER — Ambulatory Visit (INDEPENDENT_AMBULATORY_CARE_PROVIDER_SITE_OTHER): Payer: PPO | Admitting: Family Medicine

## 2021-01-08 ENCOUNTER — Other Ambulatory Visit: Payer: Self-pay

## 2021-01-08 VITALS — BP 156/70 | HR 77 | Temp 98.1°F | Ht 62.0 in | Wt 123.0 lb

## 2021-01-08 DIAGNOSIS — Z9289 Personal history of other medical treatment: Secondary | ICD-10-CM | POA: Diagnosis not present

## 2021-01-08 DIAGNOSIS — N3000 Acute cystitis without hematuria: Secondary | ICD-10-CM | POA: Diagnosis not present

## 2021-01-08 DIAGNOSIS — Z8673 Personal history of transient ischemic attack (TIA), and cerebral infarction without residual deficits: Secondary | ICD-10-CM

## 2021-01-08 DIAGNOSIS — N10 Acute tubulo-interstitial nephritis: Secondary | ICD-10-CM | POA: Diagnosis not present

## 2021-01-08 DIAGNOSIS — E785 Hyperlipidemia, unspecified: Secondary | ICD-10-CM | POA: Diagnosis not present

## 2021-01-08 DIAGNOSIS — T796XXA Traumatic ischemia of muscle, initial encounter: Secondary | ICD-10-CM

## 2021-01-08 DIAGNOSIS — I350 Nonrheumatic aortic (valve) stenosis: Secondary | ICD-10-CM

## 2021-01-08 DIAGNOSIS — N1832 Chronic kidney disease, stage 3b: Secondary | ICD-10-CM | POA: Diagnosis not present

## 2021-01-08 MED ORDER — AMLODIPINE BESYLATE 10 MG PO TABS: 10.0000 mg | ORAL_TABLET | Freq: Every day | ORAL | 1 refills | Status: AC

## 2021-01-08 MED ORDER — LISINOPRIL 40 MG PO TABS: 40.0000 mg | ORAL_TABLET | Freq: Every day | ORAL | 1 refills | Status: DC

## 2021-01-08 NOTE — Progress Notes (Deleted)
Having medical records scan 10 min sections during the SVT episodes also had the Daily summery page scanned in.

## 2021-01-08 NOTE — Patient Instructions (Signed)
Please make sure you have a follow up with Dr. Chalmers Cater within next 2-4 weeks.

## 2021-01-08 NOTE — Progress Notes (Signed)
Paula Haynes , [birth year], 85 y.o., female MRN: [id number] Patient Care Team    Relationship Specialty Notifications Start End  Ma Hillock, DO PCP - General Family Medicine  12/28/17   Jacelyn Pi, MD Consulting Physician Endocrinology  11/16/17   Murlean Iba, MD  Nephrology  08/30/19   Brendolyn Patty, MD  Dermatology  08/30/19   Gardiner Barefoot, DPM Consulting Physician Podiatry  08/30/19   Susa Day, MD Consulting Physician Orthopedic Surgery  08/30/19   Leandrew Koyanagi, MD Referring Physician Ophthalmology  08/30/19   Revankar, Reita Cliche, MD Consulting Physician Cardiology  01/08/21     Chief Complaint  Patient presents with   Hospitalization Follow-up     Subjective:  Paula Haynes  is a 85 y.o. female presents for hospital follow up after recent admission on 528/2022 for primary diagnosis acute encephalopathy. Patient was discharged on 12/26/2020 to home. Patients discharge summary has been reviewed, as well as all labs/image studies obtained during hospitalization.   Patients hospital course: Patient presented to the hospital after being found down at home for an unknown length of time.  She was found to be in rhabdomyolysis with an elevated CK to 1045.  She also had mild hyponatremia and urinalysis consistent with a UTI.  She was started on ceftriaxone and IV fluids.  Her condition steadily improved and she was discharged home on oral antibiotics of Omnicef. Since hospital discharge patient reports patient reports she has been doing well.  Tolerating p.o. and eating and drinking well.  She denies any symptoms of a UTI, but she admits she did not have any that she really recalls prior.  She has finished the Omnicef antibiotics.  She states she feels greatly improved.  She was recently admitted for prior CVA just a few weeks prior to this admission.  She reports she has continued the Plavix.  She does have an appointment with her cardiologist coming up.  She is taking the  amlodipine, lisinopril and metoprolol.  The amlodipine had been added back on during this last hospital admission. She understands she needs to make an appointment with her endocrinologist Dr. Chalmers Cater to follow-up on her thyroid disorder.  She had had a mildly elevated TSH prior to this current admission.  During this current admission they repeated her TSH and it was higher at around 11.  No results for input(s): HGB, HCT, WBC, PLT in the last 168 hours. CMP Latest Ref Rng & Units 12/25/2020 12/24/2020 12/23/2020  Glucose 70 - 99 mg/dL 127(H) 112(H) 111(H)  BUN 8 - 23 mg/dL 24(H) 26(H) 28(H)  Creatinine 0.44 - 1.00 mg/dL 1.17(H) 1.32(H) 1.38(H)  Sodium 135 - 145 mmol/L 137 136 132(L)  Potassium 3.5 - 5.1 mmol/L 3.5 3.7 3.4(L)  Chloride 98 - 111 mmol/L 104 105 98  CO2 22 - 32 mmol/L 23 22 21(L)  Calcium 8.9 - 10.3 mg/dL 8.3(L) 8.6(L) 8.5(L)  Total Protein 6.5 - 8.1 g/dL - 6.0(L) 7.1  Total Bilirubin 0.3 - 1.2 mg/dL - 0.6 1.1  Alkaline Phos 38 - 126 U/L - 38 38  AST 15 - 41 U/L - 40 50(H)  ALT 0 - 44 U/L - 21 21      CT Head Wo Contrast  Result Date: 12/22/2020 CLINICAL DATA:  Mental status change. EXAM: CT HEAD WITHOUT CONTRAST TECHNIQUE: Contiguous axial images were obtained from the base of the skull through the vertex without intravenous contrast. COMPARISON:  November 15, 2020 FINDINGS: Brain: No evidence  of acute infarction, hemorrhage, hydrocephalus, extra-axial collection or mass lesion/mass effect. Extensive small vessel ischemic changes throughout the deep white matter. This small right thalamic infarct seen by MRI dated April 2022 is not clearly visualized, likely obscured by microvascular changes. Vascular: Calcific atherosclerotic disease of the intra cavernous carotid arteries. Skull: Normal. Negative for fracture or focal lesion. Sinuses/Orbits: No acute finding. Other: None. IMPRESSION: 1. No acute intracranial abnormality. 2. Extensive small vessel ischemic changes throughout the deep  white matter. 3. The small right thalamic infarct seen by MRI dated April 2022 is not clearly visualized, likely obscured by microvascular changes. Electronically Signed   By: Fidela Salisbury M.D.   On: 12/22/2020 19:24   CT Cervical Spine Wo Contrast  Result Date: 12/22/2020 CLINICAL DATA:  85 year old female with neck trauma. EXAM: CT CERVICAL SPINE WITHOUT CONTRAST TECHNIQUE: Multidetector CT imaging of the cervical spine was performed without intravenous contrast. Multiplanar CT image reconstructions were also generated. COMPARISON:  CT angiography dated 11/15/2020. FINDINGS: Alignment: No acute subluxation Skull base and vertebrae: No acute fracture. Osteopenia. Soft tissues and spinal canal: No prevertebral fluid or swelling. No visible canal hematoma. Disc levels: Multilevel degenerative changes with disc space narrowing and endplate irregularity and spurring. Upper chest: Negative. Other: Bilateral carotid bulb calcified plaques. IMPRESSION: 1. No acute/traumatic cervical spine pathology. 2. Multilevel degenerative changes. Electronically Signed   By: Anner Crete M.D.   On: 12/22/2020 20:48   CT PELVIS WO CONTRAST  Result Date: 12/22/2020 CLINICAL DATA:  Pelvic trauma EXAM: CT PELVIS WITHOUT CONTRAST TECHNIQUE: Multidetector CT imaging of the pelvis was performed following the standard protocol without intravenous contrast. COMPARISON:  12/22/2020 FINDINGS: Evaluation is limited by patient motion throughout the exam. Urinary Tract:  No urinary tract calculi.  Bladder is unremarkable. Bowel: Diverticulosis of the distal colon without diverticulitis. No evidence of obstruction or ileus. Vascular/Lymphatic: Diffuse atherosclerosis. No pathologic adenopathy. Reproductive: Calcified uterine fibroid. The uterus is atrophic. No adnexal masses. Other:  No free fluid or free gas.  No abdominal wall hernia. Musculoskeletal: Patient motion results in significant artifact through the lower pelvis,  particularly through the level of the right superior pubic ramus or possible fracture was seen on radiograph. No definite fractures are identified on the provided images. Prominent degenerative changes are seen within the lower lumbar spine. Reconstructed images demonstrate no additional findings. IMPRESSION: 1. Extensive patient motion through the lower pelvis limits evaluation. Specifically, there is extensive motion artifact through the level of the right superior pubic ramus where a possible abnormality was seen on radiograph. 2. No definite displaced fractures are identified. However, if fracture remains a concern the patient could be brought back for repeat imaging through the lower pelvis and addendum to this report provided. 3. Distal colonic diverticulosis without diverticulitis. 4.  Aortic Atherosclerosis (ICD10-I70.0). Electronically Signed   By: Randa Ngo M.D.   On: 12/22/2020 20:50   CARDIAC EVENT MONITOR  Result Date: 01/03/2021 Predominent rhythm is sinus rhythm   Rates 46 to 163 bpm   Average HR 72 bpm Short bursts of SVT, max HR 160 bpm   Not detected No atrial fibrillation No triggered events    DG Chest Port 1 View  Result Date: 12/22/2020 CLINICAL DATA:  85 year old female with chest pain. EXAM: PORTABLE CHEST 1 VIEW COMPARISON:  None. FINDINGS: No focal consolidation, pleural effusion or pneumothorax. The cardiac silhouette is within limits. Atherosclerotic calcification of the aorta. No acute osseous pathology. Degenerative changes of the spine. IMPRESSION: No active disease. Electronically Signed  By: Anner Crete M.D.   On: 12/22/2020 22:20   DG Hip Unilat With Pelvis 2-3 Views Right  Result Date: 12/22/2020 CLINICAL DATA:  Weakness, altered level of consciousness EXAM: DG HIP (WITH OR WITHOUT PELVIS) 2-3V RIGHT COMPARISON:  None. FINDINGS: Frontal view of the pelvis as well as frontal and frogleg lateral views of the right hip are obtained. There is subtle cortical  irregularity within the right superior pubic ramus near the junction with the symphysis, which could reflect a minimally displaced fracture. If pain is referable to this region, CT scan may be useful for confirmation. No other acute bony abnormalities are identified. Alignment is anatomic. Mild symmetrical bilateral hip osteoarthritis. Prominent lower lumbar spondylosis and facet hypertrophy. Calcification left hemipelvis most consistent with degenerating fibroid. IMPRESSION: 1. Findings suspicious for a minimally displaced right superior pubic ramus fracture. Follow-up CT scan may be useful for confirmation. 2. Mild symmetrical bilateral hip osteoarthritis. Electronically Signed   By: Randa Ngo M.D.   On: 12/22/2020 19:06     Depression screen [facility] 2/9 09/26/2020 02/22/2019 12/28/2017 11/16/2017 02/15/2016  Decreased Interest 0 0 0 1 0  Down, Depressed, Hopeless 0 0 0 0 0  PHQ - 2 Score 0 0 0 1 0    No Known Allergies Social History   Tobacco Use   Smoking status: Never   Smokeless tobacco: Never  Substance Use Topics   Alcohol use: No    Alcohol/week: 0.0 standard drinks   Past Medical History:  Diagnosis Date   Arthritis    Chronic right-sided lumbar radiculopathy 10/17/2020   CKD (chronic kidney disease) stage 3, GFR 30-59 ml/min (HCC)    History of blood product transfusion    Hyperproteinemia 02/28/2019   Hypertension    Stroke ([place])    Acute ischemic CVA in 2022   Thyroid disease    Past Surgical History:  Procedure Laterality Date   FINGER SURGERY     right middle finger/due to glass window cutting finger   REFRACTIVE SURGERY     bil   Family History  Problem Relation Age of Onset   Heart disease Father    Arthritis Other        parent   Hypertension Other        parent   Stroke Other        parent   Allergies as of 01/08/2021   No Known Allergies      Medication List        Accurate as of January 08, 2021  2:03 PM. If you have any questions, ask your nurse or  doctor.          STOP taking these medications    aspirin 81 MG tablet Stopped by: Howard Pouch, DO       TAKE these medications    amLODipine 10 MG tablet Commonly known as: NORVASC Take 1 tablet (10 mg total) by mouth daily.   atorvastatin 40 MG tablet Commonly known as: LIPITOR Take 1 tablet (40 mg total) by mouth daily.   Blood Pressure Kit Devi 1 Device by Does not apply route daily. Large cuff   clopidogrel 75 MG tablet Commonly known as: PLAVIX Take 1 tablet (75 mg total) by mouth daily.   lisinopril 40 MG tablet Commonly known as: ZESTRIL Take 40 mg by mouth daily.   metoprolol succinate 50 MG 24 hr tablet Commonly known as: TOPROL-XL Take 1 tablet (50 mg total) by mouth daily. Take with or immediately following a  meal.   Omega-3 1000 MG Caps Take 1,000 mg by mouth daily.   Opcon-A 0.027-0.315 % Soln Generic drug: Naphazoline-Pheniramine Place 1-2 drops into both eyes 4 (four) times daily as needed (for seasonal allergies).   Prolia 60 MG/ML Sosy injection Generic drug: denosumab Inject into the skin every 6 (six) months.   sodium bicarbonate 650 MG tablet Take 1,300 mg by mouth 2 (two) times daily.   Synthroid 100 MCG tablet Generic drug: levothyroxine Take 100 mcg by mouth daily before breakfast.   Vitamin D3 125 MCG (5000 UT) Caps Take 5,000 Units by mouth at bedtime.        All past medical history, surgical history, allergies, family history, immunizations and medications were updated in the EMR today and reviewed under the history and medication portions of their EMR.      ROS: Negative, with the exception of above mentioned in HPI   Objective:  BP (!) 156/70   Pulse 77   Temp 98.1 F (36.7 C) (Oral)   Ht $R'5\' 2"'Kv$  (1.575 m)   Wt 123 lb (55.8 kg)   SpO2 99%   BMI 22.50 kg/m  Body mass index is 22.5 kg/m. Gen: Afebrile. No acute distress. Nontoxic in appearance, well developed, well nourished.  HENT: AT. [place].  No cough.  No  hoarseness. Eyes:Pupils Equal Round Reactive to light, Extraocular movements intact,  Conjunctiva without redness, discharge or icterus. Neck/lymp/endocrine: Supple, no lymphadenopathy, no thyromegaly CV: RRR, no edema Chest: CTAB, no wheeze or crackles. Good air movement, normal resp effort.  Skin: No rashes, purpura or petechiae.  Skin intact Neuro:  Normal gait. PERLA. EOMi. Alert. Oriented x3  Psych: Normal affect, dress and demeanor. Normal speech. Normal thought content and judgment.  Assessment/Plan: NAEEMAH JASMER is a 85 y.o. female present for OV for Hospital discharge follow up Stage 3b chronic kidney disease (HCC)/ Traumatic rhabdomyolysis, initial encounter (HCC)/hyponatremia Patient strongly encouraged to hydrate.  Will repeat labs today to ensure kidney function has remained stable and CK has returned to normal. - CK - Comp Met (CMET) - CBC w/Diff  Acute cystitis without hematuria/History of recent hospitalization Repeat urine culture today to ensure complete treatment of Klebsiella UTI.  Patient seems to be back at her baseline.  Hypertension/hyperlipidemia/acquired thrombophilia/history of stroke/chronic diastolic CHF Her blood pressure is elevated today despite increasing her blood pressure regimen.  I have asked her granddaughter to make sure she is taking all the medications.  Her granddaughter has been helping her set up her pillbox.  We went over her meds today extensively. They will make sure she is taking all of her meds and monitor at home.  If blood pressures routinely over 140/80, they understand to follow-up with either cardio cardiology or here. Continue amlodipine 10 mg daily Continue lisinopril 40 mg daily Continue metoprolol 50 mg daily Continue atorvastatin 40 mg daily Continue Plavix 75 mg daily  Hypothyroidism: Patient understands to make an appointment ASAP with her endocrinologist to follow-up on her thyroid panel which has been abnormal  x2.   Reviewed expectations re: course of current medical issues. Discussed self-management of symptoms. Outlined signs and symptoms indicating need for more acute intervention. Patient verbalized understanding and all questions were answered. Patient received an After-Visit Summary. Any changes in medications were reviewed and patient was provided with updated med list with their AVS.     No orders of the defined types were placed in this encounter.    Note is dictated utilizing voice recognition software.  Although note has been proof read prior to signing, occasional typographical errors still can be missed. If any questions arise, please do not hesitate to call for verification.   electronically signed by:  Howard Pouch, DO  [place]

## 2021-01-09 ENCOUNTER — Telehealth: Payer: Self-pay

## 2021-01-09 LAB — CBC WITH DIFFERENTIAL/PLATELET
Absolute Monocytes: 728 cells/uL (ref 200–950)
Basophils Absolute: 63 cells/uL (ref 0–200)
Basophils Relative: 0.9 %
Eosinophils Absolute: 147 cells/uL (ref 15–500)
Eosinophils Relative: 2.1 %
HCT: 33.9 % — ABNORMAL LOW (ref 35.0–45.0)
Hemoglobin: 11.4 g/dL — ABNORMAL LOW (ref 11.7–15.5)
Lymphs Abs: 1792 cells/uL (ref 850–3900)
MCH: 31.1 pg (ref 27.0–33.0)
MCHC: 33.6 g/dL (ref 32.0–36.0)
MCV: 92.4 fL (ref 80.0–100.0)
MPV: 11 fL (ref 7.5–12.5)
Monocytes Relative: 10.4 %
Neutro Abs: 4270 cells/uL (ref 1500–7800)
Neutrophils Relative %: 61 %
Platelets: 310 10*3/uL (ref 140–400)
RBC: 3.67 10*6/uL — ABNORMAL LOW (ref 3.80–5.10)
RDW: 12.9 % (ref 11.0–15.0)
Total Lymphocyte: 25.6 %
WBC: 7 10*3/uL (ref 3.8–10.8)

## 2021-01-09 LAB — COMPREHENSIVE METABOLIC PANEL
AG Ratio: 1.2 (calc) (ref 1.0–2.5)
ALT: 32 U/L — ABNORMAL HIGH (ref 6–29)
AST: 35 U/L (ref 10–35)
Albumin: 4.3 g/dL (ref 3.6–5.1)
Alkaline phosphatase (APISO): 54 U/L (ref 37–153)
BUN/Creatinine Ratio: 19 (calc) (ref 6–22)
BUN: 34 mg/dL — ABNORMAL HIGH (ref 7–25)
CO2: 24 mmol/L (ref 20–32)
Calcium: 9.8 mg/dL (ref 8.6–10.4)
Chloride: 98 mmol/L (ref 98–110)
Creat: 1.78 mg/dL — ABNORMAL HIGH (ref 0.60–0.88)
Globulin: 3.7 g/dL (calc) (ref 1.9–3.7)
Glucose, Bld: 86 mg/dL (ref 65–99)
Potassium: 4.4 mmol/L (ref 3.5–5.3)
Sodium: 135 mmol/L (ref 135–146)
Total Bilirubin: 0.5 mg/dL (ref 0.2–1.2)
Total Protein: 8 g/dL (ref 6.1–8.1)

## 2021-01-09 LAB — CK: Total CK: 65 U/L (ref 29–143)

## 2021-01-09 LAB — URINE CULTURE
MICRO NUMBER:: 12006133
SPECIMEN QUALITY:: ADEQUATE

## 2021-01-09 NOTE — Telephone Encounter (Signed)
Home health orders received 01/09/21 for [place] health initiation orders: Yes.  Home health re-certification orders: No. Patient last seen by ordering physician for this condition: 01/09/21. Must be less than 90 days for re-certification and less than 30 days prior for initiation. Visit must have been for the condition the orders are being placed.  Patient meets criteria for Physician to sign orders: Yes.        Current med list has been attached: Yes        Orders placed on physicians desk for signature: 01/09/21 (date) If patient does not meet criteria for orders to be signed: pt was called to schedule appt. Appt is scheduled for n/a.   [place]

## 2021-01-09 NOTE — Telephone Encounter (Signed)
Completed and placed in CMA work basket 

## 2021-01-10 ENCOUNTER — Telehealth: Payer: Self-pay

## 2021-01-10 ENCOUNTER — Encounter: Payer: Self-pay | Admitting: Family Medicine

## 2021-01-10 DIAGNOSIS — N289 Disorder of kidney and ureter, unspecified: Secondary | ICD-10-CM

## 2021-01-10 DIAGNOSIS — N3000 Acute cystitis without hematuria: Secondary | ICD-10-CM | POA: Insufficient documentation

## 2021-01-10 DIAGNOSIS — Z9289 Personal history of other medical treatment: Secondary | ICD-10-CM | POA: Insufficient documentation

## 2021-01-10 NOTE — Telephone Encounter (Signed)
Form faxed

## 2021-01-10 NOTE — Telephone Encounter (Signed)
Scheduled appt for labs on 6/22.  Read instructions again to granddaughter because patient wasn't sure about all that was said to her.

## 2021-01-10 NOTE — Telephone Encounter (Signed)
-----   Message from Ma Hillock, DO sent at 01/09/2021  5:24 PM EDT ----- These inform patient: The muscle enzyme that was elevated is now back to normal. Her kidney function is decreased and she needs to increase her hydration.  This decrease is a direct reflection of the muscle enzyme being elevated, which can cause kidney dysfunction and requires increasing hydration in order to flush out the kidneys.  Her blood cell counts are normal. Her urine culture is pending.  We will call her with these results once received.  I want her to really focus on increasing her hydration over the next week and we will need to have her come in for lab appointment only in 1 week for repeat BMP to ensure kidney function is returning to normal.  Please place this order and schedule her.  Thanks

## 2021-01-10 NOTE — Telephone Encounter (Signed)
Spoke with pt regarding labs and instructions. Labs placed

## 2021-01-11 DIAGNOSIS — E890 Postprocedural hypothyroidism: Secondary | ICD-10-CM | POA: Diagnosis not present

## 2021-01-14 DIAGNOSIS — M6282 Rhabdomyolysis: Secondary | ICD-10-CM | POA: Diagnosis not present

## 2021-01-14 DIAGNOSIS — Z7902 Long term (current) use of antithrombotics/antiplatelets: Secondary | ICD-10-CM | POA: Diagnosis not present

## 2021-01-14 DIAGNOSIS — Z87440 Personal history of urinary (tract) infections: Secondary | ICD-10-CM | POA: Diagnosis not present

## 2021-01-14 DIAGNOSIS — I6782 Cerebral ischemia: Secondary | ICD-10-CM | POA: Diagnosis not present

## 2021-01-14 DIAGNOSIS — M160 Bilateral primary osteoarthritis of hip: Secondary | ICD-10-CM | POA: Diagnosis not present

## 2021-01-14 DIAGNOSIS — Z9181 History of falling: Secondary | ICD-10-CM | POA: Diagnosis not present

## 2021-01-14 DIAGNOSIS — M5416 Radiculopathy, lumbar region: Secondary | ICD-10-CM | POA: Diagnosis not present

## 2021-01-14 DIAGNOSIS — E785 Hyperlipidemia, unspecified: Secondary | ICD-10-CM | POA: Diagnosis not present

## 2021-01-14 DIAGNOSIS — G9340 Encephalopathy, unspecified: Secondary | ICD-10-CM | POA: Diagnosis not present

## 2021-01-14 DIAGNOSIS — E871 Hypo-osmolality and hyponatremia: Secondary | ICD-10-CM | POA: Diagnosis not present

## 2021-01-14 DIAGNOSIS — K5790 Diverticulosis of intestine, part unspecified, without perforation or abscess without bleeding: Secondary | ICD-10-CM | POA: Diagnosis not present

## 2021-01-14 DIAGNOSIS — M4802 Spinal stenosis, cervical region: Secondary | ICD-10-CM | POA: Diagnosis not present

## 2021-01-14 DIAGNOSIS — I130 Hypertensive heart and chronic kidney disease with heart failure and stage 1 through stage 4 chronic kidney disease, or unspecified chronic kidney disease: Secondary | ICD-10-CM | POA: Diagnosis not present

## 2021-01-14 DIAGNOSIS — I700 Atherosclerosis of aorta: Secondary | ICD-10-CM | POA: Diagnosis not present

## 2021-01-14 DIAGNOSIS — N1832 Chronic kidney disease, stage 3b: Secondary | ICD-10-CM | POA: Diagnosis not present

## 2021-01-14 DIAGNOSIS — E872 Acidosis: Secondary | ICD-10-CM | POA: Diagnosis not present

## 2021-01-14 DIAGNOSIS — Z8673 Personal history of transient ischemic attack (TIA), and cerebral infarction without residual deficits: Secondary | ICD-10-CM | POA: Diagnosis not present

## 2021-01-14 DIAGNOSIS — I5032 Chronic diastolic (congestive) heart failure: Secondary | ICD-10-CM | POA: Diagnosis not present

## 2021-01-14 DIAGNOSIS — E039 Hypothyroidism, unspecified: Secondary | ICD-10-CM | POA: Diagnosis not present

## 2021-01-14 DIAGNOSIS — I080 Rheumatic disorders of both mitral and aortic valves: Secondary | ICD-10-CM | POA: Diagnosis not present

## 2021-01-14 DIAGNOSIS — M47812 Spondylosis without myelopathy or radiculopathy, cervical region: Secondary | ICD-10-CM | POA: Diagnosis not present

## 2021-01-16 ENCOUNTER — Ambulatory Visit: Payer: PPO

## 2021-01-16 ENCOUNTER — Telehealth: Payer: Self-pay

## 2021-01-16 NOTE — Telephone Encounter (Signed)
A user error has taken place: encounter opened in error, closed for administrative reasons.

## 2021-01-18 ENCOUNTER — Ambulatory Visit (INDEPENDENT_AMBULATORY_CARE_PROVIDER_SITE_OTHER): Payer: PPO

## 2021-01-18 ENCOUNTER — Other Ambulatory Visit: Payer: Self-pay

## 2021-01-18 DIAGNOSIS — N289 Disorder of kidney and ureter, unspecified: Secondary | ICD-10-CM | POA: Diagnosis not present

## 2021-01-18 LAB — BASIC METABOLIC PANEL
BUN: 43 mg/dL — ABNORMAL HIGH (ref 6–23)
CO2: 25 mEq/L (ref 19–32)
Calcium: 9.2 mg/dL (ref 8.4–10.5)
Chloride: 98 mEq/L (ref 96–112)
Creatinine, Ser: 1.69 mg/dL — ABNORMAL HIGH (ref 0.40–1.20)
GFR: 27.77 mL/min — ABNORMAL LOW (ref >60.00)
Glucose, Bld: 87 mg/dL (ref 70–99)
Potassium: 4.1 mEq/L (ref 3.5–5.1)
Sodium: 133 mEq/L — ABNORMAL LOW (ref 135–145)

## 2021-01-21 ENCOUNTER — Telehealth: Payer: Self-pay | Admitting: Family Medicine

## 2021-01-21 ENCOUNTER — Telehealth: Payer: Self-pay | Admitting: *Deleted

## 2021-01-21 NOTE — Telephone Encounter (Signed)
I spoke to patient and provided her with the cardiac monitoring results. She will speak to her PCP about a referral to see cardiology.

## 2021-01-21 NOTE — Telephone Encounter (Signed)
LVM for pt to CB regarding results.  

## 2021-01-21 NOTE — Progress Notes (Signed)
Kindly inform the patient that heart monitoring study did not reveal atrial fibrillation but did show brief episodes of rapid heart rate.  She may need to review this with her cardiologist and ask primary physician for referral if she does not have 1

## 2021-01-21 NOTE — Telephone Encounter (Signed)
-----   Message from Garvin Fila, MD sent at 01/21/2021  5:25 PM EDT ----- Paula Haynes inform the patient that heart monitoring study did not reveal atrial fibrillation but did show brief episodes of rapid heart rate.  She may need to review this with her cardiologist and ask primary physician for referral if she does not have 1

## 2021-01-21 NOTE — Telephone Encounter (Signed)
Please inform patient her kidney function mildly improved since last check, however still decreased from her baseline. I would encourage her to make an appointment to follow-up with her nephrologist/kidney doctor concerning these changes.  With her permission please fax these lab results over to their office-office listed under care team.   Thanks.

## 2021-01-22 ENCOUNTER — Telehealth: Payer: Self-pay | Admitting: Family Medicine

## 2021-01-22 NOTE — Telephone Encounter (Signed)
Spoke with patient regarding results/recommendations.  Pt agrees to have results sent to nephrologist

## 2021-01-22 NOTE — Telephone Encounter (Signed)
LVM for pt to CB regarding results. See other encounter

## 2021-01-22 NOTE — Telephone Encounter (Signed)
Please call patient: I was forwarded the recommendations from her heart monitor results. Recommended a referral to cardiology for further evaluation.  She is already established with a cardiologist Dr. Basilio Cairo and should not need a referral.  I would recommend she call their office and schedule an appointment and let them know she recently had a heart monitor reading which resulted with brief episodes of rapid heart rate up to 160, but no A. fib.

## 2021-01-22 NOTE — Telephone Encounter (Signed)
Spoke with patient regarding results/recommendations.  

## 2021-02-18 DIAGNOSIS — E875 Hyperkalemia: Secondary | ICD-10-CM | POA: Diagnosis not present

## 2021-02-18 DIAGNOSIS — N184 Chronic kidney disease, stage 4 (severe): Secondary | ICD-10-CM | POA: Diagnosis not present

## 2021-02-18 DIAGNOSIS — I10 Essential (primary) hypertension: Secondary | ICD-10-CM | POA: Diagnosis not present

## 2021-02-18 DIAGNOSIS — R8281 Pyuria: Secondary | ICD-10-CM | POA: Diagnosis not present

## 2021-02-18 DIAGNOSIS — E872 Acidosis: Secondary | ICD-10-CM | POA: Diagnosis not present

## 2021-02-18 DIAGNOSIS — E870 Hyperosmolality and hypernatremia: Secondary | ICD-10-CM | POA: Diagnosis not present

## 2021-02-19 ENCOUNTER — Ambulatory Visit: Payer: PPO | Admitting: Adult Health

## 2021-02-19 ENCOUNTER — Other Ambulatory Visit: Payer: Self-pay

## 2021-02-19 ENCOUNTER — Encounter: Payer: Self-pay | Admitting: Adult Health

## 2021-02-19 VITALS — BP 156/76 | HR 66 | Ht 62.0 in | Wt 121.0 lb

## 2021-02-19 DIAGNOSIS — I10 Essential (primary) hypertension: Secondary | ICD-10-CM

## 2021-02-19 DIAGNOSIS — E785 Hyperlipidemia, unspecified: Secondary | ICD-10-CM

## 2021-02-19 DIAGNOSIS — I6381 Other cerebral infarction due to occlusion or stenosis of small artery: Secondary | ICD-10-CM

## 2021-02-19 DIAGNOSIS — I639 Cerebral infarction, unspecified: Secondary | ICD-10-CM | POA: Diagnosis not present

## 2021-02-19 NOTE — Patient Instructions (Signed)
Continue clopidogrel 75 mg daily  and atorvastatin for secondary stroke prevention  Continue to follow up with PCP regarding cholesterol and blood pressure management  Maintain strict control of hypertension with blood pressure goal below 130/90 and cholesterol with LDL cholesterol (bad cholesterol) goal below 70 mg/dL.       Followup in the future with me in 6 months or call earlier if needed       Thank you for coming to see [place] at [facility]. I hope we have been able to provide you high quality care today.  You may receive a patient satisfaction survey over the next few weeks. We would appreciate your feedback and comments so that we may continue to improve ourselves and the health of our patients.

## 2021-02-19 NOTE — Progress Notes (Signed)
[facility] [address] [place]. [place] [address] [phone]       HOSPITAL FOLLOW UP NOTE  [name] Date of Birth:  16-Jan-1936 Medical Record Number:  [id number]   Reason for Referral:  hospital stroke follow up    SUBJECTIVE:   CHIEF COMPLAINT:  Chief Complaint  Patient presents with   Follow-up    RM 3 alone PT is well and stable, no current complications     HPI:   [name] is a 85 y.o. female with history of HTN and thyroid disease who presented on 11/14/2020 after presenting to her PCPs office with complaints of left facial numbness.  She was transported to ED via EMS for concerns of stroke.  Personally reviewed hospitalization pertinent progress notes, lab work and imaging summary provided.  Evaluated by Dr. Erlinda Hong with stroke work-up revealing acute infarction in right thalamus likely secondary to small vessel disease.  CTA head/neck negative.  EF 55 to 60%.  LDL 103.  A1c 6.1 recommended DAPT for 3 weeks and Plavix alone as on aspirin PTA as well as initiating atorvastatin 40 mg daily.  HTN stable and resume home meds.  Other stroke risk factors include advanced age and family history of stroke.  No personal prior history of stroke.  Other active problems include frequent PACs but no evidence of A. fib on telemetry and recommended cardiac event monitor outpatient for further monitoring.  PT/OT no therapy needs and discharged home.   Today, 02/19/2021, Ms. [name] is being seen for hospital follow-up unaccompanied (granddaughter waited in lobby).  Stable from stroke standpoint without new stroke/TIA symptoms. She does report some mild right periorbital numbness which has been gradually improving.  She continues to live alone but plans on moving from [place] to [place] to be closer to her family although they do still check on her frequently.  Granddaughter assists with medication management.  She did have hospitalization from 5/28-6/1 after  she was found down and confused for uncertain period of time.  She was found to have rhabdo with elevated CK at 1045 as well as mild hyponatremia and urinalysis consistent with UTI.  She has been doing well since this time.  PCP recently repeated CK level which is now within normal limits.  Completed 3 weeks DAPT and remains on Plavix and as well as atorvastatin without associated side effects.  Blood pressure today 156/76 - routinely monitors at home and stable - typically elevated during appointments.  30-day cardiac event monitor completed which was negative for A. fib but did show episodes of rapid heart rate and advised to follow-up with cardiology which is scheduled on 8/8.  No further concerns at this time.     ROS:   14 system review of systems performed and negative with exception of those listed in HPI  PMH:  Past Medical History:  Diagnosis Date   Arthritis    Chronic right-sided lumbar radiculopathy 10/17/2020   CKD (chronic kidney disease) stage 3, GFR 30-59 ml/min (HCC)    History of blood product transfusion    Hyperproteinemia 02/28/2019   Hypertension    Stroke [facility])    Acute ischemic CVA in 2022   Thyroid disease     PSH:  Past Surgical History:  Procedure Laterality Date   FINGER SURGERY     right middle finger/due to glass window cutting finger   REFRACTIVE SURGERY     bil    Social History:  Social History  Socioeconomic History   Marital status: Married    Spouse name: Not on file   Number of children: Not on file   Years of education: Not on file   Highest education level: Not on file  Occupational History   Not on file  Tobacco Use   Smoking status: Never   Smokeless tobacco: Never  Vaping Use   Vaping Use: Never used  Substance and Sexual Activity   Alcohol use: No    Alcohol/week: 0.0 standard drinks   Drug use: No   Sexual activity: Not Currently  Other Topics Concern   Not on file  Social History Narrative   Widowed.  College-educated  for years plus.  Self-employed/retired.   Takes a daily vitamin.   Drinks caffeine.   Smoke alarm in the home.   Wears a seatbelt.   Social Determinants of Health   Financial Resource Strain: Not on file  Food Insecurity: Not on file  Transportation Needs: Not on file  Physical Activity: Not on file  Stress: Not on file  Social Connections: Not on file  Intimate Partner Violence: Not on file    Family History:  Family History  Problem Relation Age of Onset   Heart disease Father    Arthritis Other        parent   Hypertension Other        parent   Stroke Other        parent    Medications:   Current Outpatient Medications on File Prior to Visit  Medication Sig Dispense Refill   amLODipine (NORVASC) 10 MG tablet Take 1 tablet (10 mg total) by mouth daily. 90 tablet 1   atorvastatin (LIPITOR) 40 MG tablet Take 1 tablet (40 mg total) by mouth daily. 90 tablet 3   Cholecalciferol (VITAMIN D3) 5000 units CAPS Take 5,000 Units by mouth at bedtime.     clopidogrel (PLAVIX) 75 MG tablet Take 1 tablet (75 mg total) by mouth daily. 90 tablet 3   denosumab (PROLIA) 60 MG/ML SOSY injection Inject into the skin every 6 (six) months.     lisinopril (ZESTRIL) 40 MG tablet Take 1 tablet (40 mg total) by mouth daily. 90 tablet 1   metoprolol succinate (TOPROL-XL) 50 MG 24 hr tablet Take 1 tablet (50 mg total) by mouth daily. Take with or immediately following a meal. 90 tablet 3   Naphazoline-Pheniramine (OPCON-A) 0.027-0.315 % SOLN Place 1-2 drops into both eyes 4 (four) times daily as needed (for seasonal allergies).     Omega-3 1000 MG CAPS Take 1,000 mg by mouth daily.     sodium bicarbonate 650 MG tablet Take 1,300 mg by mouth 2 (two) times daily.     SYNTHROID 100 MCG tablet Take 100 mcg by mouth daily before breakfast.     No current facility-administered medications on file prior to visit.    Allergies:  No Known Allergies    OBJECTIVE:  Physical Exam  Vitals:    02/19/21 1359  BP: (!) 156/76  Pulse: 66  Weight: 121 lb (54.9 kg)  Height: 5\' 2"  (1.575 m)   Body mass index is 22.13 kg/m. No results found.   Post stroke PHQ 2/9 Depression screen PHQ 2/9 02/21/2021  Decreased Interest 0  Down, Depressed, Hopeless 0  PHQ - 2 Score 0     General: well developed, well nourished, very pleasant elderly Caucasian female, seated, in no evident distress Head: head normocephalic and atraumatic.   Neck: supple with no carotid  or supraclavicular bruits Cardiovascular: regular rate and rhythm, no murmurs Musculoskeletal: no deformity Skin:  no rash/petichiae Vascular:  Normal pulses all extremities   Neurologic Exam Mental Status: Awake and fully alert.  Fluent speech and language.  Oriented to place and time. Recent and remote memory intact. Attention span, concentration and fund of knowledge appropriate. Mood and affect appropriate.  Cranial Nerves: Fundoscopic exam reveals sharp disc margins. Pupils equal, briskly reactive to light. Extraocular movements full without nystagmus. Visual fields full to confrontation. Hearing intact. Facial sensation intact. Face, tongue, palate moves normally and symmetrically.  Motor: Normal bulk and tone. Normal strength in all tested extremity muscles Sensory.: intact to touch , pinprick , position and vibratory sensation.  Coordination: Rapid alternating movements normal in all extremities. Finger-to-nose and heel-to-shin performed accurately bilaterally. Gait and Station: Arises from chair without difficulty. Stance is normal. Gait demonstrates normal stride length and balance without use of assistive device.  Reflexes: 1+ and symmetric. Toes downgoing.     NIHSS  0 Modified Rankin  1      ASSESSMENT: Paula Haynes is a 85 y.o. year old female with recent right thalamic stroke secondary to small vessel disease on 11/14/2020.  Vascular risk factors include HTN, HLD and advanced age.      PLAN:  Right  thalamic stroke:  Residual deficit: left periorbital numbness -gradually improving Continue clopidogrel 75 mg daily  and atorvastatin 40 mg daily for secondary stroke prevention.   Discussed secondary stroke prevention measures and importance of close PCP follow up for aggressive stroke risk factor management. I have gone over the pathophysiology of stroke, warning signs and symptoms, risk factors and their management in some detail with instructions to go to the closest emergency room for symptoms of concern. HTN: BP goal <130/90.  Stable on current regimen per PCP HLD: LDL goal <70. Recent LDL 103 -continue atorvastatin 40 mg daily.  Advised to follow-up with PCP for repeat lipid panel -unable to complete during visit as office lost power half way through visit     Follow up in 6 months or call earlier if needed   CC:  [facility] provider: Dr. Leonie Man PCP: Howard Pouch A, DO    I spent 44 minutes of face-to-face and non-face-to-face time with patient.  This included previsit chart review including review of recent hospitalization, lab review, study review, order entry, electronic health record documentation, patient education regarding recent stroke and etiologies, secondary stroke prevention measures and importance of managing stroke risk factors, residual stroke deficits and typical recovery time and answered all questions to patient satisfaction.  Visit was able to be completed despite losing power in office   Frann Rider, [facility]  [facility] [address] [place], [place] [address]  Phone [phone] Fax [phone] Note: This document was prepared with digital dictation and possible smart phrase technology. Any transcriptional errors that result from this process are unintentional.

## 2021-02-21 ENCOUNTER — Encounter: Payer: Self-pay | Admitting: Adult Health

## 2021-02-21 NOTE — Progress Notes (Signed)
I agree with the above plan 

## 2021-02-25 ENCOUNTER — Ambulatory Visit: Payer: PPO | Admitting: Family Medicine

## 2021-02-25 DIAGNOSIS — N1832 Chronic kidney disease, stage 3b: Secondary | ICD-10-CM | POA: Diagnosis not present

## 2021-02-25 DIAGNOSIS — E875 Hyperkalemia: Secondary | ICD-10-CM | POA: Diagnosis not present

## 2021-02-25 DIAGNOSIS — I10 Essential (primary) hypertension: Secondary | ICD-10-CM | POA: Diagnosis not present

## 2021-02-27 ENCOUNTER — Other Ambulatory Visit: Payer: Self-pay

## 2021-03-04 ENCOUNTER — Encounter: Payer: Self-pay | Admitting: Cardiology

## 2021-03-04 ENCOUNTER — Other Ambulatory Visit: Payer: Self-pay

## 2021-03-04 ENCOUNTER — Ambulatory Visit: Payer: PPO | Admitting: Cardiology

## 2021-03-04 VITALS — BP 134/70 | HR 76 | Ht 60.0 in | Wt 125.0 lb

## 2021-03-04 DIAGNOSIS — I5032 Chronic diastolic (congestive) heart failure: Secondary | ICD-10-CM

## 2021-03-04 DIAGNOSIS — M810 Age-related osteoporosis without current pathological fracture: Secondary | ICD-10-CM | POA: Diagnosis not present

## 2021-03-04 DIAGNOSIS — I350 Nonrheumatic aortic (valve) stenosis: Secondary | ICD-10-CM | POA: Diagnosis not present

## 2021-03-04 DIAGNOSIS — Z8673 Personal history of transient ischemic attack (TIA), and cerebral infarction without residual deficits: Secondary | ICD-10-CM | POA: Diagnosis not present

## 2021-03-04 DIAGNOSIS — E785 Hyperlipidemia, unspecified: Secondary | ICD-10-CM | POA: Diagnosis not present

## 2021-03-04 DIAGNOSIS — E890 Postprocedural hypothyroidism: Secondary | ICD-10-CM | POA: Diagnosis not present

## 2021-03-04 NOTE — Patient Instructions (Signed)
Medication Instructions:  Your physician recommends that you continue on your current medications as directed. Please refer to the Current Medication list given to you today.  *If you need a refill on your cardiac medications before your next appointment, please call your pharmacy*   Lab Work: Your physician recommends that you return for lab work in: TODAY Lipids If you have labs (blood work) drawn today and your tests are completely normal, you will receive your results only by: MyChart Message (if you have MyChart) OR A paper copy in the mail If you have any lab test that is abnormal or we need to change your treatment, we will call you to review the results.   Testing/Procedures: None   Follow-Up: At [facility], you and your health needs are our priority.  As part of our continuing mission to provide you with exceptional heart care, we have created designated Provider Care Teams.  These Care Teams include your primary Cardiologist (physician) and Advanced Practice Providers (APPs -  Physician Assistants and Nurse Practitioners) who all work together to provide you with the care you need, when you need it.  We recommend signing up for the patient portal called "MyChart".  Sign up information is provided on this After Visit Summary.  MyChart is used to connect with patients for Virtual Visits (Telemedicine).  Patients are able to view lab/test results, encounter notes, upcoming appointments, etc.  Non-urgent messages can be sent to your provider as well.   To learn more about what you can do with MyChart, go to [email or web address].    Your next appointment:   5 month(s)  The format for your next appointment:   In Person  Provider:   Robert Krasowski, MD   Other Instructions   

## 2021-03-04 NOTE — Progress Notes (Signed)
Cardiology Office Note:    Date:  03/04/2021   ID:  Paula Haynes, DOB August 20, 1935, MRN [id number]  PCP:  Paula Hillock, DO  Cardiologist:  Paula Campus, MD    Referring MD: Paula Hillock, DO   Chief Complaint  Patient presents with   Follow-up    History of Present Illness:    Paula Haynes is a 85 y.o. female with past medical history significant for essential hypertension, dyslipidemia, lumbar radiculopathy, in April 2022 she was find to have small thalamic stroke.  After that she did see my partner for assessment of potential cardiac etiology of her CVA.  Work-up included echocardiogram which revealed preserved left ventricle ejection fraction, mild aortic stenosis, she also wore a monitor which shows some atrial tachycardia but no atrial fibrillation.  She has brought today to my office to talk about those issues.  Overall she seems to be doing well she said she recently moved her to carry a lot of bags and packages and had no difficulty doing it.  Denies have any palpitation no new CVA/TIA-like symptoms.  Past Medical History:  Diagnosis Date   Arthritis    Chronic right-sided lumbar radiculopathy 10/17/2020   CKD (chronic kidney disease) stage 3, GFR 30-59 ml/min (HCC)    History of blood product transfusion    Hyperproteinemia 02/28/2019   Hypertension    Stroke [facility])    Acute ischemic CVA in 2022   Thyroid disease     Past Surgical History:  Procedure Laterality Date   FINGER SURGERY     right middle finger/due to glass window cutting finger   REFRACTIVE SURGERY     bil    Current Medications: Current Meds  Medication Sig   amLODipine (NORVASC) 10 MG tablet Take 1 tablet (10 mg total) by mouth daily.   atorvastatin (LIPITOR) 40 MG tablet Take 1 tablet (40 mg total) by mouth daily.   Cholecalciferol (VITAMIN D3) 5000 units CAPS Take 5,000 Units by mouth at bedtime.   clopidogrel (PLAVIX) 75 MG tablet Take 1 tablet (75 mg total) by mouth daily.   denosumab  (PROLIA) 60 MG/ML SOSY injection Inject 60 mg into the skin every 6 (six) months.   Levothyroxine Sodium 112 MCG CAPS Take 1 tablet by mouth daily.   metoprolol succinate (TOPROL-XL) 50 MG 24 hr tablet Take 1 tablet (50 mg total) by mouth daily. Take with or immediately following a meal.   Naphazoline-Pheniramine (OPCON-A) 0.027-0.315 % SOLN Place 1-2 drops into both eyes 4 (four) times daily as needed (for seasonal allergies).   Omega-3 1000 MG CAPS Take 1,000 mg by mouth daily.   sodium bicarbonate 650 MG tablet Take 1,300 mg by mouth 2 (two) times daily.   SYNTHROID 100 MCG tablet Take 100 mcg by mouth daily before breakfast.     Allergies:   Patient has no known allergies.   Social History   Socioeconomic History   Marital status: Married    Spouse name: Not on file   Number of children: Not on file   Years of education: Not on file   Highest education level: Not on file  Occupational History   Not on file  Tobacco Use   Smoking status: Never   Smokeless tobacco: Never  Vaping Use   Vaping Use: Never used  Substance and Sexual Activity   Alcohol use: No    Alcohol/week: 0.0 standard drinks   Drug use: No   Sexual activity: Not Currently  Other Topics  Concern   Not on file  Social History Narrative   Widowed.  College-educated for years plus.  Self-employed/retired.   Takes a daily vitamin.   Drinks caffeine.   Smoke alarm in the home.   Wears a seatbelt.   Social Determinants of Health   Financial Resource Strain: Not on file  Food Insecurity: Not on file  Transportation Needs: Not on file  Physical Activity: Not on file  Stress: Not on file  Social Connections: Not on file     Family History: The patient's family history includes Arthritis in an other family member; Heart disease in her father; Hypertension in an other family member; Stroke in an other family member. ROS:   Please see the history of present illness.    All 14 point review of systems negative  except as described per history of present illness  EKGs/Labs/Other Studies Reviewed:      Recent Labs: 12/23/2020: Magnesium 1.7; TSH 11.790 01/08/2021: ALT 32; Hemoglobin 11.4; Platelets 310 01/18/2021: BUN 43; Creatinine, Ser 1.69; Potassium 4.1; Sodium 133  Recent Lipid Panel    Component Value Date/Time   CHOL 177 11/15/2020 0154   TRIG 51 11/15/2020 0154   HDL 64 11/15/2020 0154   CHOLHDL 2.8 11/15/2020 0154   VLDL 10 11/15/2020 0154   LDLCALC 103 (H) 11/15/2020 0154   LDLDIRECT 108 (H) 09/26/2020 1431    Physical Exam:    VS:  BP 134/70 (BP Location: Left Arm, Patient Position: Sitting)   Pulse 76   Ht 5' (1.524 m)   Wt 125 lb (56.7 kg)   SpO2 97%   BMI 24.41 kg/m     Wt Readings from Last 3 Encounters:  03/04/21 125 lb (56.7 kg)  02/19/21 121 lb (54.9 kg)  01/08/21 123 lb (55.8 kg)     GEN:  Well nourished, well developed in no acute distress HEENT: Normal NECK: No JVD; No carotid bruits LYMPHATICS: No lymphadenopathy CARDIAC: RRR, no murmurs, no rubs, no gallops RESPIRATORY:  Clear to auscultation without rales, wheezing or rhonchi  ABDOMEN: Soft, non-tender, non-distended MUSCULOSKELETAL:  No edema; No deformity  SKIN: Warm and dry LOWER EXTREMITIES: no swelling NEUROLOGIC:  Alert and oriented x 3 PSYCHIATRIC:  Normal affect   ASSESSMENT:    1. Mild aortic stenosis   2. Chronic diastolic CHF (congestive heart failure) ([place])   3. History of stroke   4. Hyperlipidemia LDL goal <70    PLAN:    In order of problems listed above:  Mild aortic stenosis insignificant hemodynamically.  We will continue monitoring. History of CVA.  I did review her MRI from April which revealed multiple small lesions within the white matter that normally is not embolic in origin.  However of course thalamic stroke that she had is concerning.  She did wear a monitor which did not show any evidence of atrial fibrillation.  I talked to her today about potentially having  implantable loop recorder.  She told me straight that she is not interested.  She said she is fine and she is doing well.  Therefore, we will continue with risk modifications.  She is already on clopidogrel which I will continue, will check her cholesterol today make sure her LDL is below 70. Dyslipidemia I did review K PN which show me LDL of 103 and HDL 64.  We will check cholesterol profile today.  She is on Lipitor 40. Chronic diastolic congestive heart failure.  Compensated   Medication Adjustments/Labs and Tests Ordered: Current medicines  are reviewed at length with the patient today.  Concerns regarding medicines are outlined above.  No orders of the defined types were placed in this encounter.  Medication changes: No orders of the defined types were placed in this encounter.   Signed, Park Liter, MD, [facility] Haynes 03/04/2021 2:23 PM    [facility]

## 2021-03-04 NOTE — Addendum Note (Signed)
Addended by: Orvan July on: 03/04/2021 02:35 PM   Modules accepted: Orders

## 2021-03-05 LAB — LIPID PANEL
Chol/HDL Ratio: 2.5 ratio (ref 0.0–4.4)
Cholesterol, Total: 124 mg/dL (ref 100–199)
HDL: 50 mg/dL (ref >39)
LDL Chol Calc (NIH): 50 mg/dL (ref 0–99)
Triglycerides: 142 mg/dL (ref 0–149)
VLDL Cholesterol Cal: 24 mg/dL (ref 5–40)

## 2021-03-18 ENCOUNTER — Ambulatory Visit: Payer: PPO | Admitting: Family Medicine

## 2021-04-10 DIAGNOSIS — M545 Low back pain, unspecified: Secondary | ICD-10-CM | POA: Diagnosis not present

## 2021-04-10 DIAGNOSIS — I350 Nonrheumatic aortic (valve) stenosis: Secondary | ICD-10-CM | POA: Diagnosis not present

## 2021-04-10 DIAGNOSIS — Z23 Encounter for immunization: Secondary | ICD-10-CM | POA: Diagnosis not present

## 2021-04-10 DIAGNOSIS — Z823 Family history of stroke: Secondary | ICD-10-CM | POA: Diagnosis not present

## 2021-04-10 DIAGNOSIS — N1832 Chronic kidney disease, stage 3b: Secondary | ICD-10-CM | POA: Diagnosis not present

## 2021-04-10 DIAGNOSIS — I5189 Other ill-defined heart diseases: Secondary | ICD-10-CM | POA: Diagnosis not present

## 2021-04-10 DIAGNOSIS — Z8673 Personal history of transient ischemic attack (TIA), and cerebral infarction without residual deficits: Secondary | ICD-10-CM | POA: Diagnosis not present

## 2021-04-10 DIAGNOSIS — Z0001 Encounter for general adult medical examination with abnormal findings: Secondary | ICD-10-CM | POA: Diagnosis not present

## 2021-04-10 DIAGNOSIS — E785 Hyperlipidemia, unspecified: Secondary | ICD-10-CM | POA: Diagnosis not present

## 2021-04-10 DIAGNOSIS — I10 Essential (primary) hypertension: Secondary | ICD-10-CM | POA: Diagnosis not present

## 2021-04-10 DIAGNOSIS — D6869 Other thrombophilia: Secondary | ICD-10-CM | POA: Diagnosis not present

## 2021-04-10 DIAGNOSIS — E039 Hypothyroidism, unspecified: Secondary | ICD-10-CM | POA: Diagnosis not present

## 2021-04-10 DIAGNOSIS — Z0000 Encounter for general adult medical examination without abnormal findings: Secondary | ICD-10-CM | POA: Diagnosis not present

## 2021-04-29 ENCOUNTER — Ambulatory Visit: Payer: PPO | Admitting: Family Medicine

## 2021-05-09 DIAGNOSIS — M545 Low back pain, unspecified: Secondary | ICD-10-CM | POA: Diagnosis not present

## 2021-05-09 DIAGNOSIS — M419 Scoliosis, unspecified: Secondary | ICD-10-CM | POA: Diagnosis not present

## 2021-05-09 DIAGNOSIS — G8929 Other chronic pain: Secondary | ICD-10-CM | POA: Diagnosis not present

## 2021-05-21 DIAGNOSIS — M48062 Spinal stenosis, lumbar region with neurogenic claudication: Secondary | ICD-10-CM | POA: Diagnosis not present

## 2021-05-21 DIAGNOSIS — G8929 Other chronic pain: Secondary | ICD-10-CM | POA: Diagnosis not present

## 2021-05-21 DIAGNOSIS — M5441 Lumbago with sciatica, right side: Secondary | ICD-10-CM | POA: Diagnosis not present

## 2021-05-21 DIAGNOSIS — M25561 Pain in right knee: Secondary | ICD-10-CM | POA: Diagnosis not present

## 2021-05-23 DIAGNOSIS — M545 Low back pain, unspecified: Secondary | ICD-10-CM | POA: Diagnosis not present

## 2021-05-23 DIAGNOSIS — G8929 Other chronic pain: Secondary | ICD-10-CM | POA: Diagnosis not present

## 2021-05-31 DIAGNOSIS — G8929 Other chronic pain: Secondary | ICD-10-CM | POA: Diagnosis not present

## 2021-05-31 DIAGNOSIS — M545 Low back pain, unspecified: Secondary | ICD-10-CM | POA: Diagnosis not present

## 2021-06-04 ENCOUNTER — Other Ambulatory Visit: Payer: Self-pay

## 2021-06-04 MED ORDER — LISINOPRIL 40 MG PO TABS: 40.0000 mg | ORAL_TABLET | Freq: Every day | ORAL | 0 refills | Status: AC

## 2021-06-05 DIAGNOSIS — G8929 Other chronic pain: Secondary | ICD-10-CM | POA: Diagnosis not present

## 2021-06-05 DIAGNOSIS — M545 Low back pain, unspecified: Secondary | ICD-10-CM | POA: Diagnosis not present

## 2021-06-07 DIAGNOSIS — M48062 Spinal stenosis, lumbar region with neurogenic claudication: Secondary | ICD-10-CM | POA: Diagnosis not present

## 2021-06-13 DIAGNOSIS — E785 Hyperlipidemia, unspecified: Secondary | ICD-10-CM | POA: Diagnosis not present

## 2021-06-13 DIAGNOSIS — M4800 Spinal stenosis, site unspecified: Secondary | ICD-10-CM | POA: Diagnosis not present

## 2021-06-13 DIAGNOSIS — M419 Scoliosis, unspecified: Secondary | ICD-10-CM | POA: Diagnosis not present

## 2021-06-13 DIAGNOSIS — N1831 Chronic kidney disease, stage 3a: Secondary | ICD-10-CM | POA: Diagnosis not present

## 2021-06-27 DIAGNOSIS — M25561 Pain in right knee: Secondary | ICD-10-CM | POA: Diagnosis not present

## 2021-06-27 DIAGNOSIS — N1832 Chronic kidney disease, stage 3b: Secondary | ICD-10-CM | POA: Diagnosis not present

## 2021-06-27 DIAGNOSIS — R8290 Unspecified abnormal findings in urine: Secondary | ICD-10-CM | POA: Diagnosis not present

## 2021-06-27 DIAGNOSIS — E875 Hyperkalemia: Secondary | ICD-10-CM | POA: Diagnosis not present

## 2021-06-27 DIAGNOSIS — M1711 Unilateral primary osteoarthritis, right knee: Secondary | ICD-10-CM | POA: Diagnosis not present

## 2021-06-27 DIAGNOSIS — I10 Essential (primary) hypertension: Secondary | ICD-10-CM | POA: Diagnosis not present

## 2021-06-27 DIAGNOSIS — E871 Hypo-osmolality and hyponatremia: Secondary | ICD-10-CM | POA: Diagnosis not present

## 2021-06-28 ENCOUNTER — Other Ambulatory Visit: Payer: Self-pay

## 2021-07-04 DIAGNOSIS — E039 Hypothyroidism, unspecified: Secondary | ICD-10-CM | POA: Diagnosis not present

## 2021-07-10 DIAGNOSIS — E785 Hyperlipidemia, unspecified: Secondary | ICD-10-CM | POA: Diagnosis not present

## 2021-07-10 DIAGNOSIS — N1832 Chronic kidney disease, stage 3b: Secondary | ICD-10-CM | POA: Diagnosis not present

## 2021-07-10 DIAGNOSIS — I5032 Chronic diastolic (congestive) heart failure: Secondary | ICD-10-CM | POA: Diagnosis not present

## 2021-07-10 DIAGNOSIS — Z8673 Personal history of transient ischemic attack (TIA), and cerebral infarction without residual deficits: Secondary | ICD-10-CM | POA: Diagnosis not present

## 2021-07-10 DIAGNOSIS — E559 Vitamin D deficiency, unspecified: Secondary | ICD-10-CM | POA: Diagnosis not present

## 2021-07-10 DIAGNOSIS — I10 Essential (primary) hypertension: Secondary | ICD-10-CM | POA: Diagnosis not present

## 2021-07-10 DIAGNOSIS — E039 Hypothyroidism, unspecified: Secondary | ICD-10-CM | POA: Diagnosis not present

## 2021-07-17 ENCOUNTER — Other Ambulatory Visit (HOSPITAL_COMMUNITY): Payer: Self-pay

## 2021-08-05 DIAGNOSIS — H43813 Vitreous degeneration, bilateral: Secondary | ICD-10-CM | POA: Diagnosis not present

## 2021-08-12 ENCOUNTER — Ambulatory Visit: Payer: PPO | Admitting: Cardiology

## 2021-08-21 DIAGNOSIS — I208 Other forms of angina pectoris: Secondary | ICD-10-CM | POA: Diagnosis not present

## 2021-08-21 DIAGNOSIS — Z8673 Personal history of transient ischemic attack (TIA), and cerebral infarction without residual deficits: Secondary | ICD-10-CM | POA: Diagnosis not present

## 2021-08-21 DIAGNOSIS — E785 Hyperlipidemia, unspecified: Secondary | ICD-10-CM | POA: Diagnosis not present

## 2021-08-21 DIAGNOSIS — R0602 Shortness of breath: Secondary | ICD-10-CM | POA: Diagnosis not present

## 2021-08-21 DIAGNOSIS — N1832 Chronic kidney disease, stage 3b: Secondary | ICD-10-CM | POA: Diagnosis not present

## 2021-08-21 DIAGNOSIS — I10 Essential (primary) hypertension: Secondary | ICD-10-CM | POA: Diagnosis not present

## 2021-08-21 DIAGNOSIS — I509 Heart failure, unspecified: Secondary | ICD-10-CM | POA: Diagnosis not present

## 2021-08-21 DIAGNOSIS — I350 Nonrheumatic aortic (valve) stenosis: Secondary | ICD-10-CM | POA: Diagnosis not present

## 2021-08-21 DIAGNOSIS — I5032 Chronic diastolic (congestive) heart failure: Secondary | ICD-10-CM | POA: Diagnosis not present

## 2021-08-29 ENCOUNTER — Ambulatory Visit: Payer: PPO | Admitting: Adult Health

## 2021-09-03 DIAGNOSIS — M810 Age-related osteoporosis without current pathological fracture: Secondary | ICD-10-CM | POA: Diagnosis not present

## 2021-09-03 DIAGNOSIS — Z8673 Personal history of transient ischemic attack (TIA), and cerebral infarction without residual deficits: Secondary | ICD-10-CM | POA: Diagnosis not present

## 2021-09-03 DIAGNOSIS — E559 Vitamin D deficiency, unspecified: Secondary | ICD-10-CM | POA: Diagnosis not present

## 2021-09-03 DIAGNOSIS — R011 Cardiac murmur, unspecified: Secondary | ICD-10-CM | POA: Diagnosis not present

## 2021-09-03 DIAGNOSIS — N1832 Chronic kidney disease, stage 3b: Secondary | ICD-10-CM | POA: Diagnosis not present

## 2021-09-16 DIAGNOSIS — R0602 Shortness of breath: Secondary | ICD-10-CM | POA: Diagnosis not present

## 2021-09-16 DIAGNOSIS — I509 Heart failure, unspecified: Secondary | ICD-10-CM | POA: Diagnosis not present

## 2021-09-16 DIAGNOSIS — I208 Other forms of angina pectoris: Secondary | ICD-10-CM | POA: Diagnosis not present

## 2021-09-23 DIAGNOSIS — I350 Nonrheumatic aortic (valve) stenosis: Secondary | ICD-10-CM | POA: Diagnosis not present

## 2021-09-23 DIAGNOSIS — I208 Other forms of angina pectoris: Secondary | ICD-10-CM | POA: Diagnosis not present

## 2021-09-23 DIAGNOSIS — R0602 Shortness of breath: Secondary | ICD-10-CM | POA: Diagnosis not present

## 2021-10-01 DIAGNOSIS — E039 Hypothyroidism, unspecified: Secondary | ICD-10-CM | POA: Diagnosis not present

## 2021-10-07 DIAGNOSIS — M109 Gout, unspecified: Secondary | ICD-10-CM | POA: Diagnosis not present

## 2021-10-09 DIAGNOSIS — M109 Gout, unspecified: Secondary | ICD-10-CM | POA: Diagnosis not present

## 2021-10-09 DIAGNOSIS — Z7952 Long term (current) use of systemic steroids: Secondary | ICD-10-CM | POA: Diagnosis not present

## 2021-10-10 DIAGNOSIS — I5032 Chronic diastolic (congestive) heart failure: Secondary | ICD-10-CM | POA: Diagnosis not present

## 2021-10-10 DIAGNOSIS — Z8673 Personal history of transient ischemic attack (TIA), and cerebral infarction without residual deficits: Secondary | ICD-10-CM | POA: Diagnosis not present

## 2021-10-10 DIAGNOSIS — M109 Gout, unspecified: Secondary | ICD-10-CM | POA: Diagnosis not present

## 2021-10-10 DIAGNOSIS — E785 Hyperlipidemia, unspecified: Secondary | ICD-10-CM | POA: Diagnosis not present

## 2021-10-10 DIAGNOSIS — N184 Chronic kidney disease, stage 4 (severe): Secondary | ICD-10-CM | POA: Diagnosis not present

## 2021-10-10 DIAGNOSIS — D696 Thrombocytopenia, unspecified: Secondary | ICD-10-CM | POA: Diagnosis not present

## 2021-10-10 DIAGNOSIS — I10 Essential (primary) hypertension: Secondary | ICD-10-CM | POA: Diagnosis not present

## 2021-10-10 DIAGNOSIS — M810 Age-related osteoporosis without current pathological fracture: Secondary | ICD-10-CM | POA: Diagnosis not present

## 2021-10-10 DIAGNOSIS — N1832 Chronic kidney disease, stage 3b: Secondary | ICD-10-CM | POA: Diagnosis not present

## 2021-10-10 DIAGNOSIS — E039 Hypothyroidism, unspecified: Secondary | ICD-10-CM | POA: Diagnosis not present

## 2021-10-25 DIAGNOSIS — M2041 Other hammer toe(s) (acquired), right foot: Secondary | ICD-10-CM | POA: Diagnosis not present

## 2021-10-25 DIAGNOSIS — M8588 Other specified disorders of bone density and structure, other site: Secondary | ICD-10-CM | POA: Diagnosis not present

## 2021-10-25 DIAGNOSIS — M216X2 Other acquired deformities of left foot: Secondary | ICD-10-CM | POA: Diagnosis not present

## 2021-10-25 DIAGNOSIS — B351 Tinea unguium: Secondary | ICD-10-CM | POA: Diagnosis not present

## 2021-10-25 DIAGNOSIS — M7741 Metatarsalgia, right foot: Secondary | ICD-10-CM | POA: Diagnosis not present

## 2021-10-25 DIAGNOSIS — M2042 Other hammer toe(s) (acquired), left foot: Secondary | ICD-10-CM | POA: Diagnosis not present

## 2021-10-25 DIAGNOSIS — M216X1 Other acquired deformities of right foot: Secondary | ICD-10-CM | POA: Diagnosis not present

## 2021-10-25 DIAGNOSIS — Q667 Congenital pes cavus, unspecified foot: Secondary | ICD-10-CM | POA: Diagnosis not present

## 2021-10-25 DIAGNOSIS — Z8269 Family history of other diseases of the musculoskeletal system and connective tissue: Secondary | ICD-10-CM | POA: Diagnosis not present

## 2021-11-04 ENCOUNTER — Other Ambulatory Visit: Payer: Self-pay | Admitting: Cardiology

## 2021-11-07 ENCOUNTER — Other Ambulatory Visit: Payer: Self-pay

## 2021-11-07 DIAGNOSIS — I639 Cerebral infarction, unspecified: Secondary | ICD-10-CM

## 2021-11-07 MED ORDER — ATORVASTATIN CALCIUM 40 MG PO TABS: 40.0000 mg | ORAL_TABLET | Freq: Every day | ORAL | 0 refills | Status: AC

## 2021-11-07 MED ORDER — CLOPIDOGREL BISULFATE 75 MG PO TABS: 75.0000 mg | ORAL_TABLET | Freq: Every day | ORAL | 0 refills | Status: AC

## 2021-11-18 DIAGNOSIS — I10 Essential (primary) hypertension: Secondary | ICD-10-CM | POA: Diagnosis not present

## 2021-11-18 DIAGNOSIS — N1832 Chronic kidney disease, stage 3b: Secondary | ICD-10-CM | POA: Diagnosis not present

## 2021-11-18 DIAGNOSIS — N2581 Secondary hyperparathyroidism of renal origin: Secondary | ICD-10-CM | POA: Diagnosis not present

## 2021-11-18 DIAGNOSIS — E871 Hypo-osmolality and hyponatremia: Secondary | ICD-10-CM | POA: Diagnosis not present

## 2021-12-17 ENCOUNTER — Other Ambulatory Visit: Payer: Self-pay

## 2021-12-17 DIAGNOSIS — I639 Cerebral infarction, unspecified: Secondary | ICD-10-CM

## 2022-02-21 DIAGNOSIS — M25561 Pain in right knee: Secondary | ICD-10-CM | POA: Diagnosis not present

## 2022-02-21 DIAGNOSIS — M1711 Unilateral primary osteoarthritis, right knee: Secondary | ICD-10-CM | POA: Diagnosis not present

## 2022-03-03 DIAGNOSIS — Z8673 Personal history of transient ischemic attack (TIA), and cerebral infarction without residual deficits: Secondary | ICD-10-CM | POA: Diagnosis not present

## 2022-03-03 DIAGNOSIS — G8929 Other chronic pain: Secondary | ICD-10-CM | POA: Diagnosis not present

## 2022-03-03 DIAGNOSIS — M545 Low back pain, unspecified: Secondary | ICD-10-CM | POA: Diagnosis not present

## 2022-03-07 DIAGNOSIS — M5441 Lumbago with sciatica, right side: Secondary | ICD-10-CM | POA: Diagnosis not present

## 2022-03-07 DIAGNOSIS — G8929 Other chronic pain: Secondary | ICD-10-CM | POA: Diagnosis not present

## 2022-03-07 DIAGNOSIS — M48062 Spinal stenosis, lumbar region with neurogenic claudication: Secondary | ICD-10-CM | POA: Diagnosis not present

## 2022-03-14 DIAGNOSIS — M5441 Lumbago with sciatica, right side: Secondary | ICD-10-CM | POA: Diagnosis not present

## 2022-03-14 DIAGNOSIS — M48062 Spinal stenosis, lumbar region with neurogenic claudication: Secondary | ICD-10-CM | POA: Diagnosis not present

## 2022-03-24 DIAGNOSIS — N2581 Secondary hyperparathyroidism of renal origin: Secondary | ICD-10-CM | POA: Diagnosis not present

## 2022-03-24 DIAGNOSIS — I129 Hypertensive chronic kidney disease with stage 1 through stage 4 chronic kidney disease, or unspecified chronic kidney disease: Secondary | ICD-10-CM | POA: Diagnosis not present

## 2022-03-24 DIAGNOSIS — N390 Urinary tract infection, site not specified: Secondary | ICD-10-CM | POA: Diagnosis not present

## 2022-03-24 DIAGNOSIS — N1832 Chronic kidney disease, stage 3b: Secondary | ICD-10-CM | POA: Diagnosis not present

## 2022-03-24 DIAGNOSIS — E8722 Chronic metabolic acidosis: Secondary | ICD-10-CM | POA: Diagnosis not present

## 2022-04-01 DIAGNOSIS — M48062 Spinal stenosis, lumbar region with neurogenic claudication: Secondary | ICD-10-CM | POA: Diagnosis not present

## 2022-04-01 DIAGNOSIS — G8929 Other chronic pain: Secondary | ICD-10-CM | POA: Diagnosis not present

## 2022-04-01 DIAGNOSIS — M5441 Lumbago with sciatica, right side: Secondary | ICD-10-CM | POA: Diagnosis not present

## 2022-04-07 DIAGNOSIS — E039 Hypothyroidism, unspecified: Secondary | ICD-10-CM | POA: Diagnosis not present

## 2022-04-07 DIAGNOSIS — M109 Gout, unspecified: Secondary | ICD-10-CM | POA: Diagnosis not present

## 2022-04-07 DIAGNOSIS — E785 Hyperlipidemia, unspecified: Secondary | ICD-10-CM | POA: Diagnosis not present

## 2022-04-21 DIAGNOSIS — E785 Hyperlipidemia, unspecified: Secondary | ICD-10-CM | POA: Diagnosis not present

## 2022-04-21 DIAGNOSIS — Z8673 Personal history of transient ischemic attack (TIA), and cerebral infarction without residual deficits: Secondary | ICD-10-CM | POA: Diagnosis not present

## 2022-04-21 DIAGNOSIS — Z0001 Encounter for general adult medical examination with abnormal findings: Secondary | ICD-10-CM | POA: Diagnosis not present

## 2022-04-21 DIAGNOSIS — E039 Hypothyroidism, unspecified: Secondary | ICD-10-CM | POA: Diagnosis not present

## 2022-04-21 DIAGNOSIS — M810 Age-related osteoporosis without current pathological fracture: Secondary | ICD-10-CM | POA: Diagnosis not present

## 2022-04-21 DIAGNOSIS — Z23 Encounter for immunization: Secondary | ICD-10-CM | POA: Diagnosis not present

## 2022-04-21 DIAGNOSIS — N1832 Chronic kidney disease, stage 3b: Secondary | ICD-10-CM | POA: Diagnosis not present

## 2022-04-21 DIAGNOSIS — I10 Essential (primary) hypertension: Secondary | ICD-10-CM | POA: Diagnosis not present

## 2022-04-21 DIAGNOSIS — I5032 Chronic diastolic (congestive) heart failure: Secondary | ICD-10-CM | POA: Diagnosis not present

## 2022-04-23 DIAGNOSIS — I350 Nonrheumatic aortic (valve) stenosis: Secondary | ICD-10-CM | POA: Diagnosis not present

## 2022-04-23 DIAGNOSIS — Z8673 Personal history of transient ischemic attack (TIA), and cerebral infarction without residual deficits: Secondary | ICD-10-CM | POA: Diagnosis not present

## 2022-04-23 DIAGNOSIS — N1832 Chronic kidney disease, stage 3b: Secondary | ICD-10-CM | POA: Diagnosis not present

## 2022-04-23 DIAGNOSIS — I208 Other forms of angina pectoris: Secondary | ICD-10-CM | POA: Diagnosis not present

## 2022-04-23 DIAGNOSIS — I10 Essential (primary) hypertension: Secondary | ICD-10-CM | POA: Diagnosis not present

## 2022-04-23 DIAGNOSIS — I509 Heart failure, unspecified: Secondary | ICD-10-CM | POA: Diagnosis not present

## 2022-04-23 DIAGNOSIS — E785 Hyperlipidemia, unspecified: Secondary | ICD-10-CM | POA: Diagnosis not present

## 2022-04-23 DIAGNOSIS — R0602 Shortness of breath: Secondary | ICD-10-CM | POA: Diagnosis not present

## 2022-04-23 DIAGNOSIS — I5032 Chronic diastolic (congestive) heart failure: Secondary | ICD-10-CM | POA: Diagnosis not present

## 2022-05-11 IMAGING — CT CT HEAD W/O CM
4 series · 16 of 47 positions shown, 18 images · non-contrast
Comparison: None.

CLINICAL DATA: Numbness to left side of mouth.

EXAM:
CT HEAD WITHOUT CONTRAST
TECHNIQUE: Contiguous axial images were obtained from the base of the skull
through the vertex without intravenous contrast.

[Series 3: head without · axial · non-contrast · 0.44mm/px · z∈[-113,+12]mm · 7 of 35 slices shown, 9 images]
[im 5/35  brain]
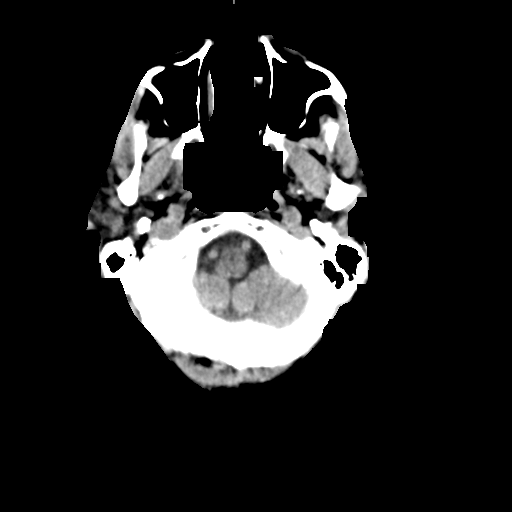
[im 5/35  bone]
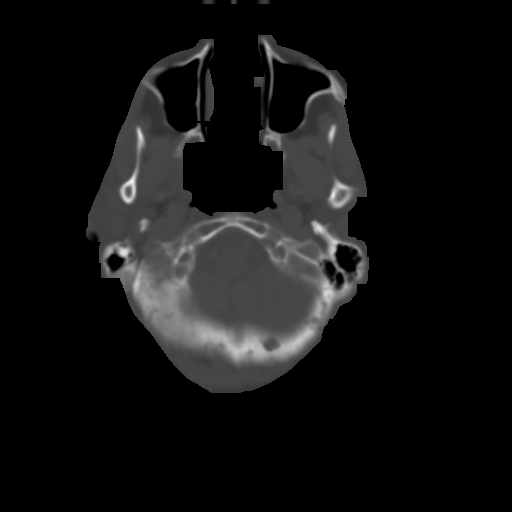
[im 9/35  brain]
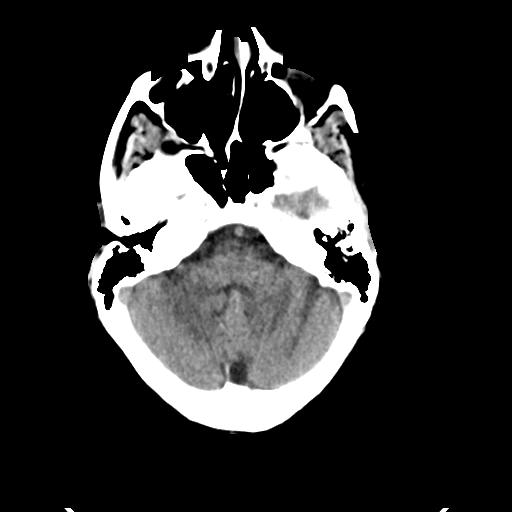
[im 13/35  brain]
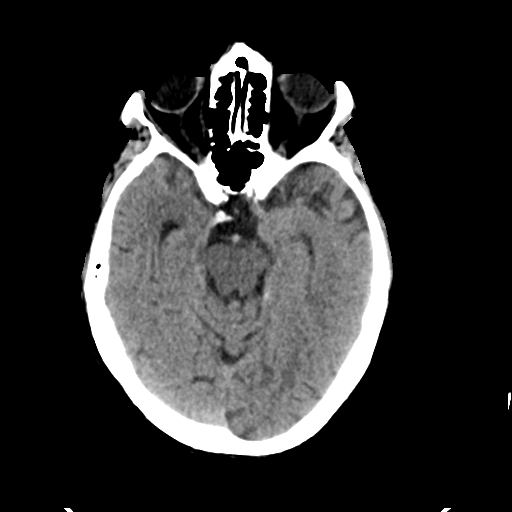
[im 18/35  brain]
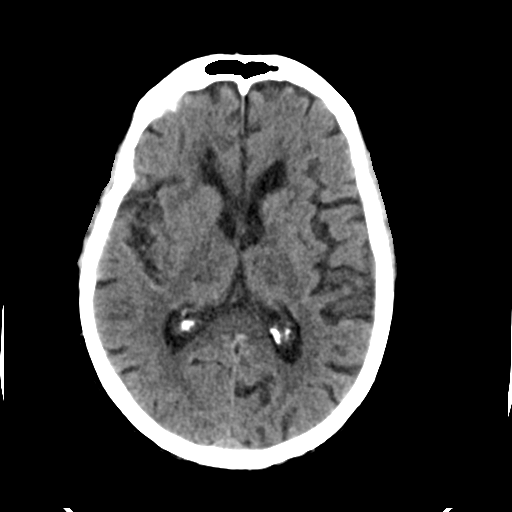
[im 22/35  brain]
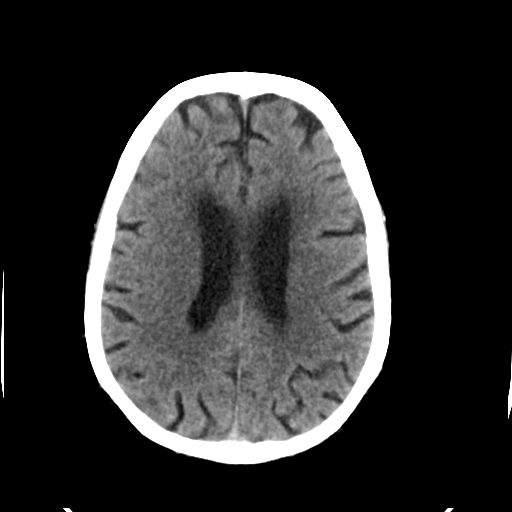
[im 22/35  bone]
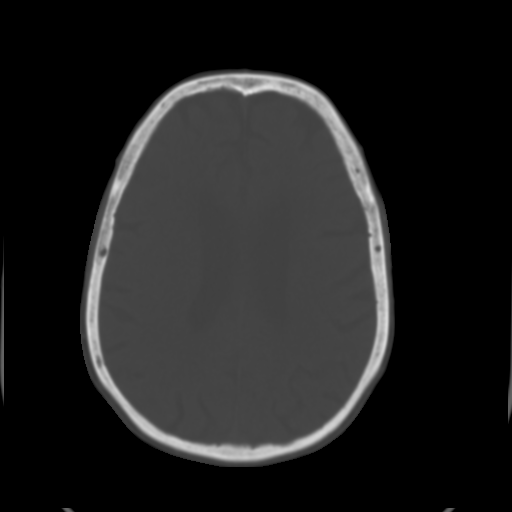
[im 26/35  brain]
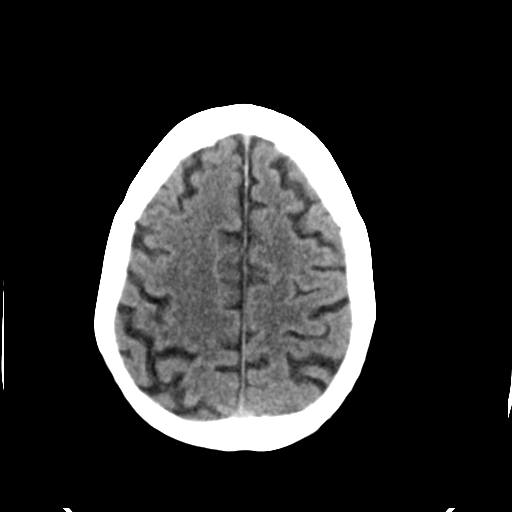
[im 30/35  brain]
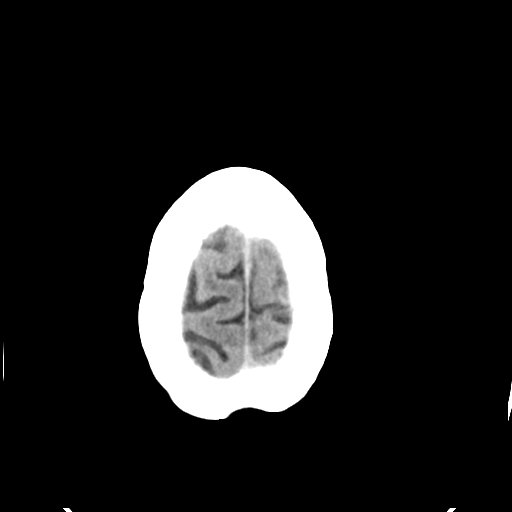

[Series 4: head bone · axial · 0.44mm/px · z∈[-117,-83]mm · 3 of 86 slices shown]
[im 9/86  bone]
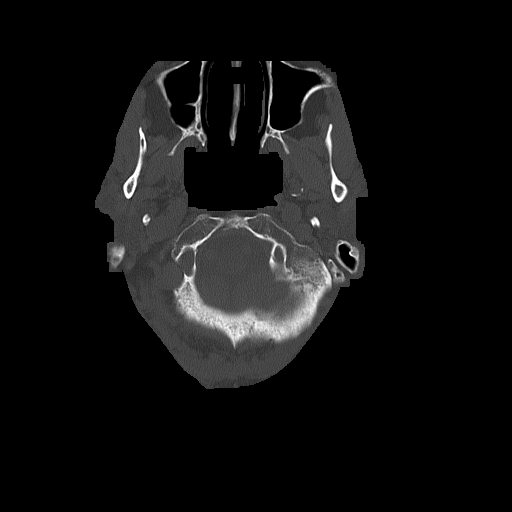
[im 18/86  bone]
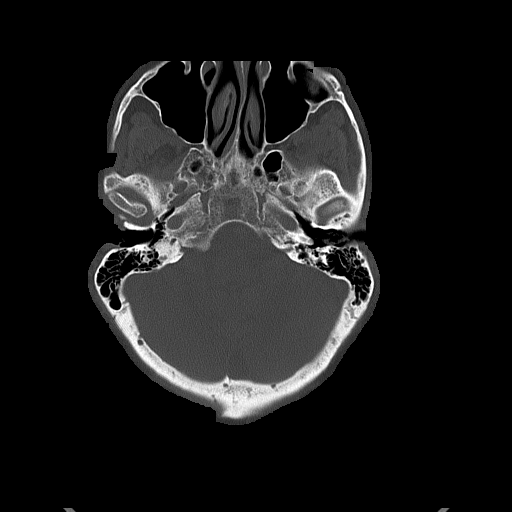
[im 26/86  bone]
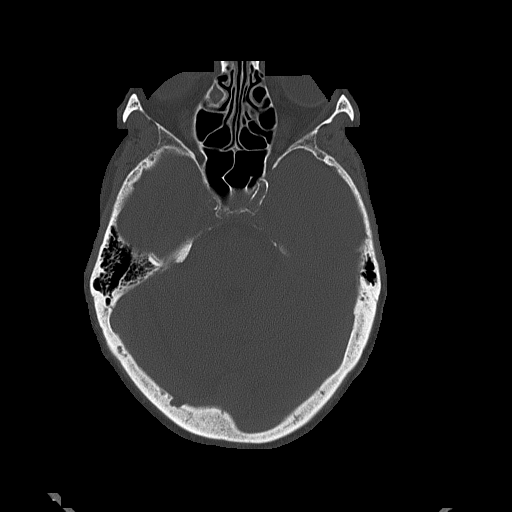

[Series 5: head without cor · coronal · non-contrast · 0.32mm/px · 3 of 72 slices shown]
[im 24/72  brain]
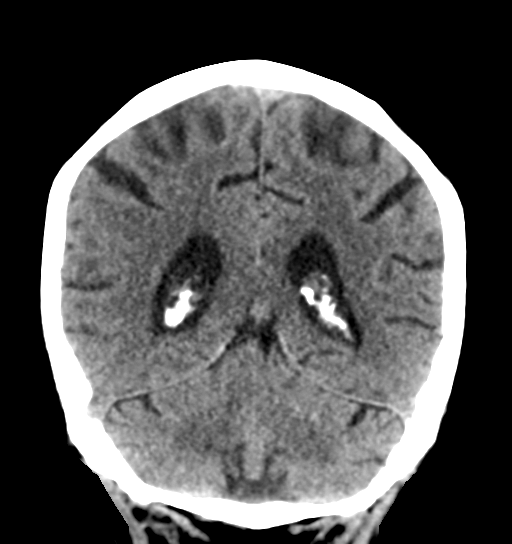
[im 32/72  brain]
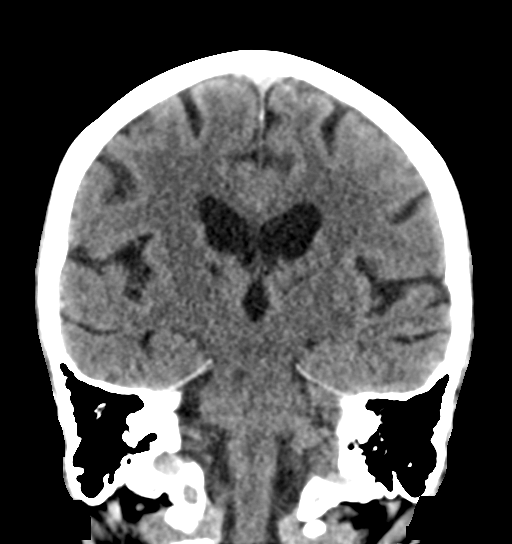
[im 40/72  brain]
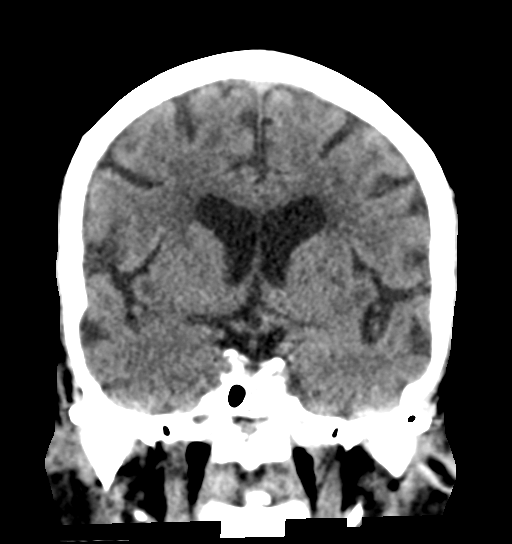

[Series 6: head without sag · sagittal · non-contrast · 0.34mm/px · 3 of 59 slices shown]
[im 20/59  brain]
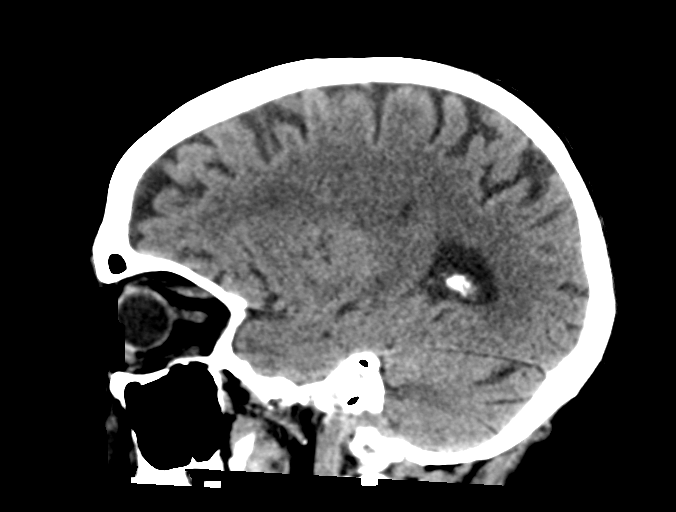
[im 30/59  brain]
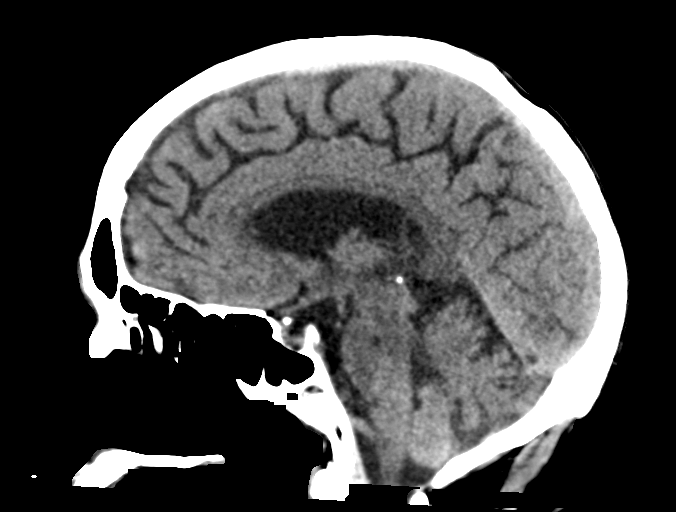
[im 39/59  brain]
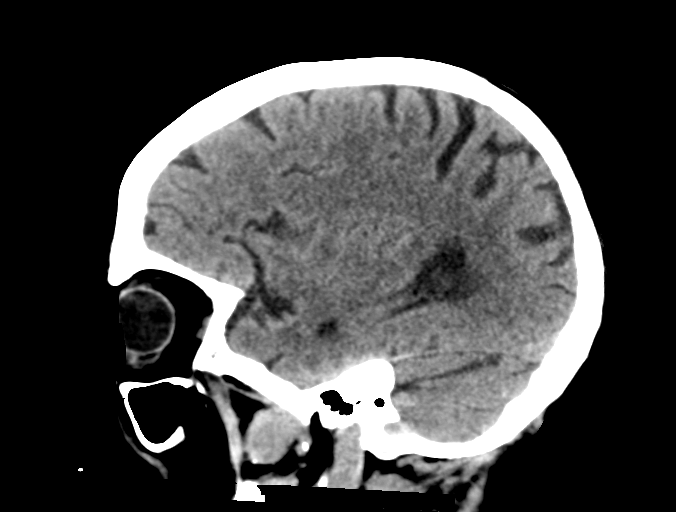

[16 of 47 positions shown; findings below may reference images not displayed]

FINDINGS: Brain: There is no evidence for acute hemorrhage, hydrocephalus,
mass lesion, or abnormal extra-axial fluid collection. No definite
CT evidence for acute infarction. Diffuse loss of parenchymal volume
is consistent with atrophy. Patchy low attenuation in the deep
hemispheric and periventricular white matter is nonspecific, but
likely reflects chronic microvascular ischemic demyelination. Age
indeterminate lacunar infarction noted in the basal ganglia
bilaterally.

Vascular: No hyperdense vessel or unexpected calcification.

Skull: No evidence for fracture. No worrisome lytic or sclerotic
lesion.

Sinuses/Orbits: The visualized paranasal sinuses and mastoid air
cells are clear. Visualized portions of the globes and intraorbital
fat are unremarkable.

Other: None.
IMPRESSION: 1. No acute intracranial abnormality.
2. Atrophy with chronic small vessel white matter ischemic disease.
3. Age indeterminate lacunar infarctions in the basal ganglia
bilaterally.

## 2022-06-12 DIAGNOSIS — N184 Chronic kidney disease, stage 4 (severe): Secondary | ICD-10-CM | POA: Diagnosis not present

## 2022-06-24 DIAGNOSIS — N2581 Secondary hyperparathyroidism of renal origin: Secondary | ICD-10-CM | POA: Diagnosis not present

## 2022-06-24 DIAGNOSIS — N184 Chronic kidney disease, stage 4 (severe): Secondary | ICD-10-CM | POA: Diagnosis not present

## 2022-06-24 DIAGNOSIS — E871 Hypo-osmolality and hyponatremia: Secondary | ICD-10-CM | POA: Diagnosis not present

## 2022-06-24 DIAGNOSIS — I129 Hypertensive chronic kidney disease with stage 1 through stage 4 chronic kidney disease, or unspecified chronic kidney disease: Secondary | ICD-10-CM | POA: Diagnosis not present

## 2022-06-24 DIAGNOSIS — I10 Essential (primary) hypertension: Secondary | ICD-10-CM | POA: Diagnosis not present

## 2022-08-04 DIAGNOSIS — N184 Chronic kidney disease, stage 4 (severe): Secondary | ICD-10-CM | POA: Diagnosis not present

## 2022-08-11 DIAGNOSIS — H43813 Vitreous degeneration, bilateral: Secondary | ICD-10-CM | POA: Diagnosis not present

## 2022-08-11 DIAGNOSIS — H04123 Dry eye syndrome of bilateral lacrimal glands: Secondary | ICD-10-CM | POA: Diagnosis not present

## 2022-08-11 DIAGNOSIS — Z961 Presence of intraocular lens: Secondary | ICD-10-CM | POA: Diagnosis not present

## 2022-08-12 DIAGNOSIS — M1711 Unilateral primary osteoarthritis, right knee: Secondary | ICD-10-CM | POA: Diagnosis not present

## 2022-09-03 DIAGNOSIS — G8929 Other chronic pain: Secondary | ICD-10-CM | POA: Diagnosis not present

## 2022-09-03 DIAGNOSIS — Z8673 Personal history of transient ischemic attack (TIA), and cerebral infarction without residual deficits: Secondary | ICD-10-CM | POA: Diagnosis not present

## 2022-09-03 DIAGNOSIS — M545 Low back pain, unspecified: Secondary | ICD-10-CM | POA: Diagnosis not present

## 2022-09-03 DIAGNOSIS — R4189 Other symptoms and signs involving cognitive functions and awareness: Secondary | ICD-10-CM | POA: Diagnosis not present

## 2022-09-10 DIAGNOSIS — I129 Hypertensive chronic kidney disease with stage 1 through stage 4 chronic kidney disease, or unspecified chronic kidney disease: Secondary | ICD-10-CM | POA: Diagnosis not present

## 2022-09-10 DIAGNOSIS — N184 Chronic kidney disease, stage 4 (severe): Secondary | ICD-10-CM | POA: Diagnosis not present

## 2022-09-10 DIAGNOSIS — I10 Essential (primary) hypertension: Secondary | ICD-10-CM | POA: Diagnosis not present

## 2022-09-10 DIAGNOSIS — N2581 Secondary hyperparathyroidism of renal origin: Secondary | ICD-10-CM | POA: Diagnosis not present

## 2022-09-29 DIAGNOSIS — E890 Postprocedural hypothyroidism: Secondary | ICD-10-CM | POA: Diagnosis not present

## 2022-09-29 DIAGNOSIS — N184 Chronic kidney disease, stage 4 (severe): Secondary | ICD-10-CM | POA: Diagnosis not present

## 2022-09-29 DIAGNOSIS — I10 Essential (primary) hypertension: Secondary | ICD-10-CM | POA: Diagnosis not present

## 2022-09-29 DIAGNOSIS — M810 Age-related osteoporosis without current pathological fracture: Secondary | ICD-10-CM | POA: Diagnosis not present

## 2022-10-13 DIAGNOSIS — E039 Hypothyroidism, unspecified: Secondary | ICD-10-CM | POA: Diagnosis not present

## 2022-10-16 DIAGNOSIS — I5032 Chronic diastolic (congestive) heart failure: Secondary | ICD-10-CM | POA: Diagnosis not present

## 2022-10-16 DIAGNOSIS — M25473 Effusion, unspecified ankle: Secondary | ICD-10-CM | POA: Diagnosis not present

## 2022-10-16 DIAGNOSIS — Z8673 Personal history of transient ischemic attack (TIA), and cerebral infarction without residual deficits: Secondary | ICD-10-CM | POA: Diagnosis not present

## 2022-10-16 DIAGNOSIS — N1832 Chronic kidney disease, stage 3b: Secondary | ICD-10-CM | POA: Diagnosis not present

## 2022-10-16 DIAGNOSIS — I10 Essential (primary) hypertension: Secondary | ICD-10-CM | POA: Diagnosis not present

## 2022-10-16 DIAGNOSIS — I350 Nonrheumatic aortic (valve) stenosis: Secondary | ICD-10-CM | POA: Diagnosis not present

## 2022-10-16 DIAGNOSIS — E785 Hyperlipidemia, unspecified: Secondary | ICD-10-CM | POA: Diagnosis not present

## 2022-10-20 DIAGNOSIS — I5032 Chronic diastolic (congestive) heart failure: Secondary | ICD-10-CM | POA: Diagnosis not present

## 2022-10-20 DIAGNOSIS — E039 Hypothyroidism, unspecified: Secondary | ICD-10-CM | POA: Diagnosis not present

## 2022-10-20 DIAGNOSIS — Z8673 Personal history of transient ischemic attack (TIA), and cerebral infarction without residual deficits: Secondary | ICD-10-CM | POA: Diagnosis not present

## 2022-10-20 DIAGNOSIS — Z0000 Encounter for general adult medical examination without abnormal findings: Secondary | ICD-10-CM | POA: Diagnosis not present

## 2022-10-20 DIAGNOSIS — I10 Essential (primary) hypertension: Secondary | ICD-10-CM | POA: Diagnosis not present

## 2022-10-20 DIAGNOSIS — E785 Hyperlipidemia, unspecified: Secondary | ICD-10-CM | POA: Diagnosis not present

## 2022-10-20 DIAGNOSIS — D696 Thrombocytopenia, unspecified: Secondary | ICD-10-CM | POA: Diagnosis not present

## 2022-10-20 DIAGNOSIS — N1832 Chronic kidney disease, stage 3b: Secondary | ICD-10-CM | POA: Diagnosis not present

## 2022-11-06 DIAGNOSIS — M539 Dorsopathy, unspecified: Secondary | ICD-10-CM | POA: Diagnosis not present

## 2022-11-06 DIAGNOSIS — N1832 Chronic kidney disease, stage 3b: Secondary | ICD-10-CM | POA: Diagnosis not present

## 2022-11-06 DIAGNOSIS — M159 Polyosteoarthritis, unspecified: Secondary | ICD-10-CM | POA: Diagnosis not present

## 2023-01-07 DIAGNOSIS — I10 Essential (primary) hypertension: Secondary | ICD-10-CM | POA: Diagnosis not present

## 2023-01-07 DIAGNOSIS — I129 Hypertensive chronic kidney disease with stage 1 through stage 4 chronic kidney disease, or unspecified chronic kidney disease: Secondary | ICD-10-CM | POA: Diagnosis not present

## 2023-01-07 DIAGNOSIS — N184 Chronic kidney disease, stage 4 (severe): Secondary | ICD-10-CM | POA: Diagnosis not present

## 2023-01-07 DIAGNOSIS — N2581 Secondary hyperparathyroidism of renal origin: Secondary | ICD-10-CM | POA: Diagnosis not present

## 2023-01-12 DIAGNOSIS — R600 Localized edema: Secondary | ICD-10-CM | POA: Diagnosis not present

## 2023-01-12 DIAGNOSIS — N2581 Secondary hyperparathyroidism of renal origin: Secondary | ICD-10-CM | POA: Diagnosis not present

## 2023-01-12 DIAGNOSIS — I10 Essential (primary) hypertension: Secondary | ICD-10-CM | POA: Diagnosis not present

## 2023-01-12 DIAGNOSIS — I129 Hypertensive chronic kidney disease with stage 1 through stage 4 chronic kidney disease, or unspecified chronic kidney disease: Secondary | ICD-10-CM | POA: Diagnosis not present

## 2023-01-12 DIAGNOSIS — N184 Chronic kidney disease, stage 4 (severe): Secondary | ICD-10-CM | POA: Diagnosis not present

## 2023-01-28 DIAGNOSIS — I129 Hypertensive chronic kidney disease with stage 1 through stage 4 chronic kidney disease, or unspecified chronic kidney disease: Secondary | ICD-10-CM | POA: Diagnosis not present

## 2023-02-02 DIAGNOSIS — N184 Chronic kidney disease, stage 4 (severe): Secondary | ICD-10-CM | POA: Diagnosis not present

## 2023-02-02 DIAGNOSIS — I129 Hypertensive chronic kidney disease with stage 1 through stage 4 chronic kidney disease, or unspecified chronic kidney disease: Secondary | ICD-10-CM | POA: Diagnosis not present

## 2023-02-02 DIAGNOSIS — I10 Essential (primary) hypertension: Secondary | ICD-10-CM | POA: Diagnosis not present

## 2023-02-02 DIAGNOSIS — E8722 Chronic metabolic acidosis: Secondary | ICD-10-CM | POA: Diagnosis not present

## 2023-02-02 DIAGNOSIS — R600 Localized edema: Secondary | ICD-10-CM | POA: Diagnosis not present

## 2023-02-02 DIAGNOSIS — N2581 Secondary hyperparathyroidism of renal origin: Secondary | ICD-10-CM | POA: Diagnosis not present

## 2023-03-04 DIAGNOSIS — M545 Low back pain, unspecified: Secondary | ICD-10-CM | POA: Diagnosis not present

## 2023-03-04 DIAGNOSIS — Z8673 Personal history of transient ischemic attack (TIA), and cerebral infarction without residual deficits: Secondary | ICD-10-CM | POA: Diagnosis not present

## 2023-03-04 DIAGNOSIS — G8929 Other chronic pain: Secondary | ICD-10-CM | POA: Diagnosis not present

## 2023-03-04 DIAGNOSIS — I10 Essential (primary) hypertension: Secondary | ICD-10-CM | POA: Diagnosis not present

## 2023-03-04 DIAGNOSIS — R4189 Other symptoms and signs involving cognitive functions and awareness: Secondary | ICD-10-CM | POA: Diagnosis not present

## 2023-03-24 DIAGNOSIS — M810 Age-related osteoporosis without current pathological fracture: Secondary | ICD-10-CM | POA: Diagnosis not present

## 2023-04-13 DIAGNOSIS — N2581 Secondary hyperparathyroidism of renal origin: Secondary | ICD-10-CM | POA: Diagnosis not present

## 2023-04-13 DIAGNOSIS — E8722 Chronic metabolic acidosis: Secondary | ICD-10-CM | POA: Diagnosis not present

## 2023-04-13 DIAGNOSIS — I10 Essential (primary) hypertension: Secondary | ICD-10-CM | POA: Diagnosis not present

## 2023-04-13 DIAGNOSIS — R600 Localized edema: Secondary | ICD-10-CM | POA: Diagnosis not present

## 2023-04-13 DIAGNOSIS — N184 Chronic kidney disease, stage 4 (severe): Secondary | ICD-10-CM | POA: Diagnosis not present

## 2023-04-13 DIAGNOSIS — I129 Hypertensive chronic kidney disease with stage 1 through stage 4 chronic kidney disease, or unspecified chronic kidney disease: Secondary | ICD-10-CM | POA: Diagnosis not present

## 2023-04-21 DIAGNOSIS — E039 Hypothyroidism, unspecified: Secondary | ICD-10-CM | POA: Diagnosis not present

## 2023-04-27 DIAGNOSIS — E039 Hypothyroidism, unspecified: Secondary | ICD-10-CM | POA: Diagnosis not present

## 2023-04-28 DIAGNOSIS — N184 Chronic kidney disease, stage 4 (severe): Secondary | ICD-10-CM | POA: Diagnosis not present

## 2023-04-28 DIAGNOSIS — E039 Hypothyroidism, unspecified: Secondary | ICD-10-CM | POA: Diagnosis not present

## 2023-04-28 DIAGNOSIS — I10 Essential (primary) hypertension: Secondary | ICD-10-CM | POA: Diagnosis not present

## 2023-04-28 DIAGNOSIS — E785 Hyperlipidemia, unspecified: Secondary | ICD-10-CM | POA: Diagnosis not present

## 2023-04-28 DIAGNOSIS — Z0001 Encounter for general adult medical examination with abnormal findings: Secondary | ICD-10-CM | POA: Diagnosis not present

## 2023-04-28 DIAGNOSIS — Z23 Encounter for immunization: Secondary | ICD-10-CM | POA: Diagnosis not present

## 2023-04-28 DIAGNOSIS — I5032 Chronic diastolic (congestive) heart failure: Secondary | ICD-10-CM | POA: Diagnosis not present

## 2023-05-06 DIAGNOSIS — M1711 Unilateral primary osteoarthritis, right knee: Secondary | ICD-10-CM | POA: Diagnosis not present

## 2023-05-07 DIAGNOSIS — M150 Primary generalized (osteo)arthritis: Secondary | ICD-10-CM | POA: Diagnosis not present

## 2023-06-29 DIAGNOSIS — Z0100 Encounter for examination of eyes and vision without abnormal findings: Secondary | ICD-10-CM | POA: Diagnosis not present

## 2023-08-06 DIAGNOSIS — I10 Essential (primary) hypertension: Secondary | ICD-10-CM | POA: Diagnosis not present

## 2023-08-06 DIAGNOSIS — I129 Hypertensive chronic kidney disease with stage 1 through stage 4 chronic kidney disease, or unspecified chronic kidney disease: Secondary | ICD-10-CM | POA: Diagnosis not present

## 2023-08-06 DIAGNOSIS — N390 Urinary tract infection, site not specified: Secondary | ICD-10-CM | POA: Diagnosis not present

## 2023-08-06 DIAGNOSIS — N1832 Chronic kidney disease, stage 3b: Secondary | ICD-10-CM | POA: Diagnosis not present

## 2023-08-06 DIAGNOSIS — R600 Localized edema: Secondary | ICD-10-CM | POA: Diagnosis not present

## 2023-08-06 DIAGNOSIS — E8722 Chronic metabolic acidosis: Secondary | ICD-10-CM | POA: Diagnosis not present

## 2023-08-06 DIAGNOSIS — N184 Chronic kidney disease, stage 4 (severe): Secondary | ICD-10-CM | POA: Diagnosis not present

## 2023-08-06 DIAGNOSIS — E871 Hypo-osmolality and hyponatremia: Secondary | ICD-10-CM | POA: Diagnosis not present

## 2023-08-06 DIAGNOSIS — N2581 Secondary hyperparathyroidism of renal origin: Secondary | ICD-10-CM | POA: Diagnosis not present

## 2023-08-11 DIAGNOSIS — D631 Anemia in chronic kidney disease: Secondary | ICD-10-CM | POA: Diagnosis not present

## 2023-08-11 DIAGNOSIS — I10 Essential (primary) hypertension: Secondary | ICD-10-CM | POA: Diagnosis not present

## 2023-08-11 DIAGNOSIS — N2581 Secondary hyperparathyroidism of renal origin: Secondary | ICD-10-CM | POA: Diagnosis not present

## 2023-08-11 DIAGNOSIS — I129 Hypertensive chronic kidney disease with stage 1 through stage 4 chronic kidney disease, or unspecified chronic kidney disease: Secondary | ICD-10-CM | POA: Diagnosis not present

## 2023-08-11 DIAGNOSIS — R600 Localized edema: Secondary | ICD-10-CM | POA: Diagnosis not present

## 2023-08-11 DIAGNOSIS — N184 Chronic kidney disease, stage 4 (severe): Secondary | ICD-10-CM | POA: Diagnosis not present

## 2023-08-11 DIAGNOSIS — E8722 Chronic metabolic acidosis: Secondary | ICD-10-CM | POA: Diagnosis not present

## 2023-08-13 DIAGNOSIS — H04123 Dry eye syndrome of bilateral lacrimal glands: Secondary | ICD-10-CM | POA: Diagnosis not present

## 2023-08-13 DIAGNOSIS — H538 Other visual disturbances: Secondary | ICD-10-CM | POA: Diagnosis not present

## 2023-08-13 DIAGNOSIS — H43813 Vitreous degeneration, bilateral: Secondary | ICD-10-CM | POA: Diagnosis not present

## 2023-08-13 DIAGNOSIS — Z961 Presence of intraocular lens: Secondary | ICD-10-CM | POA: Diagnosis not present

## 2023-09-02 DIAGNOSIS — M1711 Unilateral primary osteoarthritis, right knee: Secondary | ICD-10-CM | POA: Diagnosis not present

## 2023-09-22 DIAGNOSIS — R399 Unspecified symptoms and signs involving the genitourinary system: Secondary | ICD-10-CM | POA: Diagnosis not present

## 2023-09-22 DIAGNOSIS — M810 Age-related osteoporosis without current pathological fracture: Secondary | ICD-10-CM | POA: Diagnosis not present

## 2023-09-24 DIAGNOSIS — Z008 Encounter for other general examination: Secondary | ICD-10-CM | POA: Diagnosis not present

## 2023-09-24 DIAGNOSIS — M810 Age-related osteoporosis without current pathological fracture: Secondary | ICD-10-CM | POA: Diagnosis not present

## 2023-09-24 DIAGNOSIS — Z8673 Personal history of transient ischemic attack (TIA), and cerebral infarction without residual deficits: Secondary | ICD-10-CM | POA: Diagnosis not present

## 2023-09-24 DIAGNOSIS — I509 Heart failure, unspecified: Secondary | ICD-10-CM | POA: Diagnosis not present

## 2023-09-24 DIAGNOSIS — E785 Hyperlipidemia, unspecified: Secondary | ICD-10-CM | POA: Diagnosis not present

## 2023-09-24 DIAGNOSIS — E039 Hypothyroidism, unspecified: Secondary | ICD-10-CM | POA: Diagnosis not present

## 2023-09-30 DIAGNOSIS — M810 Age-related osteoporosis without current pathological fracture: Secondary | ICD-10-CM | POA: Diagnosis not present

## 2023-10-05 DIAGNOSIS — B351 Tinea unguium: Secondary | ICD-10-CM | POA: Diagnosis not present

## 2023-10-05 DIAGNOSIS — L608 Other nail disorders: Secondary | ICD-10-CM | POA: Diagnosis not present

## 2023-10-05 DIAGNOSIS — L603 Nail dystrophy: Secondary | ICD-10-CM | POA: Diagnosis not present

## 2023-10-05 DIAGNOSIS — L600 Ingrowing nail: Secondary | ICD-10-CM | POA: Diagnosis not present

## 2023-10-26 DIAGNOSIS — M150 Primary generalized (osteo)arthritis: Secondary | ICD-10-CM | POA: Diagnosis not present

## 2023-10-26 DIAGNOSIS — N184 Chronic kidney disease, stage 4 (severe): Secondary | ICD-10-CM | POA: Diagnosis not present

## 2023-10-26 DIAGNOSIS — M539 Dorsopathy, unspecified: Secondary | ICD-10-CM | POA: Diagnosis not present

## 2023-10-26 DIAGNOSIS — E039 Hypothyroidism, unspecified: Secondary | ICD-10-CM | POA: Diagnosis not present

## 2023-11-02 DIAGNOSIS — N184 Chronic kidney disease, stage 4 (severe): Secondary | ICD-10-CM | POA: Diagnosis not present

## 2023-11-02 DIAGNOSIS — E785 Hyperlipidemia, unspecified: Secondary | ICD-10-CM | POA: Diagnosis not present

## 2023-11-02 DIAGNOSIS — I5032 Chronic diastolic (congestive) heart failure: Secondary | ICD-10-CM | POA: Diagnosis not present

## 2023-11-02 DIAGNOSIS — I10 Essential (primary) hypertension: Secondary | ICD-10-CM | POA: Diagnosis not present

## 2023-11-02 DIAGNOSIS — E039 Hypothyroidism, unspecified: Secondary | ICD-10-CM | POA: Diagnosis not present

## 2023-11-02 DIAGNOSIS — M8588 Other specified disorders of bone density and structure, other site: Secondary | ICD-10-CM | POA: Diagnosis not present

## 2023-11-02 DIAGNOSIS — M810 Age-related osteoporosis without current pathological fracture: Secondary | ICD-10-CM | POA: Diagnosis not present

## 2023-11-02 DIAGNOSIS — D631 Anemia in chronic kidney disease: Secondary | ICD-10-CM | POA: Diagnosis not present

## 2023-12-14 DIAGNOSIS — D631 Anemia in chronic kidney disease: Secondary | ICD-10-CM | POA: Diagnosis not present

## 2023-12-14 DIAGNOSIS — N184 Chronic kidney disease, stage 4 (severe): Secondary | ICD-10-CM | POA: Diagnosis not present

## 2023-12-14 DIAGNOSIS — I10 Essential (primary) hypertension: Secondary | ICD-10-CM | POA: Diagnosis not present

## 2023-12-14 DIAGNOSIS — R600 Localized edema: Secondary | ICD-10-CM | POA: Diagnosis not present

## 2023-12-14 DIAGNOSIS — E8722 Chronic metabolic acidosis: Secondary | ICD-10-CM | POA: Diagnosis not present

## 2023-12-14 DIAGNOSIS — N2581 Secondary hyperparathyroidism of renal origin: Secondary | ICD-10-CM | POA: Diagnosis not present

## 2023-12-14 DIAGNOSIS — I129 Hypertensive chronic kidney disease with stage 1 through stage 4 chronic kidney disease, or unspecified chronic kidney disease: Secondary | ICD-10-CM | POA: Diagnosis not present

## 2023-12-16 DIAGNOSIS — E8722 Chronic metabolic acidosis: Secondary | ICD-10-CM | POA: Diagnosis not present

## 2023-12-16 DIAGNOSIS — D631 Anemia in chronic kidney disease: Secondary | ICD-10-CM | POA: Diagnosis not present

## 2023-12-16 DIAGNOSIS — N1832 Chronic kidney disease, stage 3b: Secondary | ICD-10-CM | POA: Diagnosis not present

## 2023-12-16 DIAGNOSIS — N2581 Secondary hyperparathyroidism of renal origin: Secondary | ICD-10-CM | POA: Diagnosis not present

## 2023-12-16 DIAGNOSIS — I129 Hypertensive chronic kidney disease with stage 1 through stage 4 chronic kidney disease, or unspecified chronic kidney disease: Secondary | ICD-10-CM | POA: Diagnosis not present

## 2023-12-16 DIAGNOSIS — I10 Essential (primary) hypertension: Secondary | ICD-10-CM | POA: Diagnosis not present

## 2024-01-12 DIAGNOSIS — M1711 Unilateral primary osteoarthritis, right knee: Secondary | ICD-10-CM | POA: Diagnosis not present

## 2024-02-08 NOTE — Progress Notes (Unsigned)
 Referring Physician:  No referring provider defined for this encounter.  Primary Physician:  No primary care provider on file.  History of Present Illness: 02/11/2024 [name] has a history of HTN, varicose veins, mild aortic stenosis, chronic diastolic CHF, postop hypothyroidism, osteoporosis, CKD stage 3, hyperlipidemia, CVA.   She has 3-4 year history of intermittent right sided LBP with no leg pain. No numbness, tingling, or weakness. Pain is worse with prolonged standing/walking. She has improvement with OTC back brace.   She is on PLAVIX . She takes prn tylenol .   Per Dr. Dodson' note on 04/01/22, previous lumbar MRI showed multilevel foraminal stenosis and mild central stenosis L4-L5.  Tobacco use: Does not smoke.   Bowel/Bladder Dysfunction: none  Conservative measures:  Physical therapy:  has not participated in recently Multimodal medical therapy including regular antiinflammatories: Tylenol   Injections:  03/14/22 bilateral L5-S1 TF ESI with Dodson 06/07/21 bilateral L5-S1 TF ESI with Dodson L2-L3 ESI with Dr. Colon  Past Surgery: no spine surgery  Paula Haynes has no symptoms of cervical myelopathy.  The symptoms are causing a significant impact on the patient's life.   Review of Systems:  A 10 point review of systems is negative, except for the pertinent positives and negatives detailed in the HPI.  Past Medical History: Past Medical History:  Diagnosis Date   Arthritis    Chronic right-sided lumbar radiculopathy 10/17/2020   CKD (chronic kidney disease) stage 3, GFR 30-59 ml/min (HCC)    History of blood product transfusion    Hyperproteinemia 02/28/2019   Hypertension    Stroke [facility])    Acute ischemic CVA in 2022   Thyroid  disease     Past Surgical History: Past Surgical History:  Procedure Laterality Date   FINGER SURGERY     right middle finger/due to glass window cutting finger   REFRACTIVE SURGERY     bil     Allergies: Allergies as of 02/11/2024   (No Known Allergies)    Medications: Outpatient Encounter Medications as of 02/11/2024  Medication Sig   acetaminophen  (TYLENOL ) 500 MG tablet Take 500 mg by mouth every 6 (six) hours as needed.   amLODipine  (NORVASC ) 10 MG tablet Take 1 tablet (10 mg total) by mouth daily.   atorvastatin  (LIPITOR) 40 MG tablet Take 1 tablet (40 mg total) by mouth daily.   calcitRIOL (ROCALTROL) 0.25 MCG capsule Take 0.25 mcg by mouth 3 (three) times a week.   Cholecalciferol (VITAMIN D3) 5000 units CAPS Take 5,000 Units by mouth at bedtime.   clopidogrel  (PLAVIX ) 75 MG tablet Take 1 tablet (75 mg total) by mouth daily.   denosumab  (PROLIA ) 60 MG/ML SOSY injection Inject 60 mg into the skin every 6 (six) months.   Levothyroxine  Sodium 112 MCG CAPS Take 1 tablet by mouth daily.   lisinopril  (ZESTRIL ) 40 MG tablet Take 1 tablet (40 mg total) by mouth daily.   metoprolol  succinate (TOPROL -XL) 50 MG 24 hr tablet Take 1 tablet (50 mg total) by mouth daily.   Omega-3 1000 MG CAPS Take 1,000 mg by mouth daily.   sodium bicarbonate  650 MG tablet Take 1,300 mg by mouth 2 (two) times daily.   torsemide (DEMADEX) 20 MG tablet Take by mouth. 3 times a week as needed   [DISCONTINUED] Naphazoline-Pheniramine (OPCON-A ) 0.027-0.315 % SOLN Place 1-2 drops into both eyes 4 (four) times daily as needed (for seasonal allergies).   [DISCONTINUED] SYNTHROID  100 MCG tablet Take 100 mcg by mouth daily before breakfast.  No facility-administered encounter medications on file as of 02/11/2024.    Social History: Social History   Tobacco Use   Smoking status: Never   Smokeless tobacco: Never  Vaping Use   Vaping status: Never Used  Substance Use Topics   Alcohol  use: No    Alcohol /week: 0.0 standard drinks of alcohol    Drug use: No    Family Medical History: Family History  Problem Relation Age of Onset   Heart disease Father    Arthritis Other        parent    Hypertension Other        parent   Stroke Other        parent    Physical Examination: Vitals:   02/11/24 1320  BP: 126/78    General: Patient is well developed, well nourished, calm, collected, and in no apparent distress. Attention to examination is appropriate.  Respiratory: Patient is breathing without any difficulty.   NEUROLOGICAL:     Awake, alert, oriented to person, place, and time.  Speech is clear and fluent. Fund of knowledge is appropriate.   Cranial Nerves: Pupils equal round and reactive to light.  Facial tone is symmetric.    Mild central to right sided posterior lumbar tenderness.   No abnormal lesions on exposed skin.   Strength: Side Biceps Triceps Deltoid Interossei Grip Wrist Ext. Wrist Flex.  R 5 5 5 5 5 5 5   L 5 5 5 5 5 5 5    Side Iliopsoas Quads Hamstring PF DF EHL  R 5 5 5 5 5 5   L 5 5 5 5 5 5    Reflexes are 2+ and symmetric at the biceps, brachioradialis, patella and achilles.   Hoffman's is absent.  Clonus is not present.   Bilateral upper and lower extremity sensation is intact to light touch.     No pain with IR/ER of both hips.   Slow gait and she ambulates with a cane.   Medical Decision Making  Imaging: No recent lumbar imaging.   Assessment and Plan: Ms. Crunkleton has a 3-4 year history of intermittent right sided LBP with no leg pain. No numbness, tingling, or weakness. Pain is worse with prolonged standing/walking.   No recent lumbar imaging. Per Dr. Dodson' note on 04/01/22, previous lumbar MRI showed multilevel foraminal stenosis and mild central stenosis L4-L5.  Her symptoms are suspicious for spinal stenosis.   Treatment options discussed with patient and following plan made:   - MRI of lumbar spine to evaluate LBP and probable persistent spinal stenosis.  - Depending on MRI results, may consider PT and/or revisiting injections with Dr. Dodson.  - Follow up with me in clinic to review MRI results once I have them back.    I spent a total of 35 minutes in face-to-face and non-face-to-face activities related to this patient's care today including review of outside records, review of imaging, review of symptoms, physical exam, discussion of differential diagnosis, discussion of treatment options, and documentation.   Thank you for involving me in the care of this patient.   Glade Boys PA-C Dept. of Neurosurgery

## 2024-02-11 ENCOUNTER — Ambulatory Visit: Admitting: Orthopedic Surgery

## 2024-02-11 ENCOUNTER — Encounter: Payer: Self-pay | Admitting: Orthopedic Surgery

## 2024-02-11 VITALS — BP 126/78 | Ht 61.0 in | Wt 125.1 lb

## 2024-02-11 DIAGNOSIS — M47816 Spondylosis without myelopathy or radiculopathy, lumbar region: Secondary | ICD-10-CM

## 2024-02-11 NOTE — Patient Instructions (Signed)
 It was so nice to see you today. Thank you so much for coming in.    Your imaging from 2022 showed wear and tear in your back.   I want to get an updated MRI of your lower back to look into things further. We will get this approved through your insurance and [facility] will call you to schedule the appointment. Ask about your patient responsibility. You do not need to pay this prior to getting MRI, they can bill you.   [facility] (building with the white pillars) is located off of [address]. The address is [address] [place], [place] [address].    After you have the MRI, it can take 14-28 days for me to get the results back. If I don't have them in 2 weeks, we will call to try to get the results.   Once I have the results, we will call you to schedule a follow up visit with me to review them.   Please do not hesitate to call if you have any questions or concerns. You can also message me in MyChart.   Glade Boys PA-C [phone]     The physicians and staff at [facility] at [facility] are committed to providing excellent care. You may receive a survey asking for feedback about your experience at our office. We value you your feedback and appreciate you taking the time to to fill it out. The [facility] leadership team is also available to discuss your experience in person, feel free to contact us  [phone].

## 2024-02-17 ENCOUNTER — Ambulatory Visit
Admission: RE | Admit: 2024-02-17 | Discharge: 2024-02-17 | Disposition: A | Discharge status: Home / Self Care | Source: Ambulatory Visit | Attending: Orthopedic Surgery | Admitting: Orthopedic Surgery

## 2024-02-17 DIAGNOSIS — M47816 Spondylosis without myelopathy or radiculopathy, lumbar region: Secondary | ICD-10-CM

## 2024-02-17 DIAGNOSIS — M5126 Other intervertebral disc displacement, lumbar region: Secondary | ICD-10-CM | POA: Diagnosis not present

## 2024-03-08 NOTE — Progress Notes (Unsigned)
 Referring Physician:  No referring provider defined for this encounter.  Primary Physician:  No primary care provider on file.  History of Present Illness: 03/08/2024 Paula Haynes has a history of HTN, varicose veins, mild aortic stenosis, chronic diastolic CHF, postop hypothyroidism, osteoporosis, CKD stage 3, hyperlipidemia, CVA.   Last seen by me on 02/11/24 for right sided LBP. Per Dr. Dodson' note on 04/01/22, previous lumbar MRI showed multilevel foraminal stenosis and mild central stenosis L4-L5.   She is here to review her lumbar MRI results.   She continues with intermittent right sided LBP that is worse with prolonged standing or walking. Some improvement with back brace and using her cane. No leg pain. No numbness, tingling, or weakness.   She is on PLAVIX . She takes prn tylenol .   Tobacco use: Does not smoke.   Conservative measures:  Physical therapy:  has not participated in recently Multimodal medical therapy including regular antiinflammatories: Tylenol   Injections:  03/14/22 bilateral L5-S1 TF ESI with Dodson 06/07/21 bilateral L5-S1 TF ESI with Dodson L2-L3 ESI with Dr. Colon  Past Surgery: no spine surgery  Paula Haynes has no symptoms of cervical myelopathy.  The symptoms are causing a significant impact on the patient's life.   Review of Systems:  A 10 point review of systems is negative, except for the pertinent positives and negatives detailed in the HPI.  Past Medical History: Past Medical History:  Diagnosis Date   Arthritis    Chronic right-sided lumbar radiculopathy 10/17/2020   CKD (chronic kidney disease) stage 3, GFR 30-59 ml/min (HCC)    History of blood product transfusion    Hyperproteinemia 02/28/2019   Hypertension    Stroke [facility])    Acute ischemic CVA in 2022   Thyroid  disease     Past Surgical History: Past Surgical History:  Procedure Laterality Date   FINGER SURGERY     right middle finger/due to glass window  cutting finger   REFRACTIVE SURGERY     bil    Allergies: Allergies as of 03/15/2024   (No Known Allergies)    Medications: Outpatient Encounter Medications as of 03/15/2024  Medication Sig   acetaminophen  (TYLENOL ) 500 MG tablet Take 500 mg by mouth every 6 (six) hours as needed.   amLODipine  (NORVASC ) 10 MG tablet Take 1 tablet (10 mg total) by mouth daily.   atorvastatin  (LIPITOR) 40 MG tablet Take 1 tablet (40 mg total) by mouth daily.   b complex vitamins capsule Take 1 capsule by mouth daily.   calcitRIOL (ROCALTROL) 0.25 MCG capsule Take 0.25 mcg by mouth 3 (three) times a week.   Cholecalciferol (VITAMIN D3) 5000 units CAPS Take 5,000 Units by mouth at bedtime.   clopidogrel  (PLAVIX ) 75 MG tablet Take 1 tablet (75 mg total) by mouth daily.   denosumab  (PROLIA ) 60 MG/ML SOSY injection Inject 60 mg into the skin every 6 (six) months.   Ferrous Sulfate (IRON) 325 (65 Fe) MG TABS Take by mouth.   Levothyroxine  Sodium 112 MCG CAPS Take 1 tablet by mouth daily.   lisinopril  (ZESTRIL ) 40 MG tablet Take 1 tablet (40 mg total) by mouth daily.   metoprolol  succinate (TOPROL -XL) 50 MG 24 hr tablet Take 1 tablet (50 mg total) by mouth daily.   Omega-3 1000 MG CAPS Take 1,000 mg by mouth daily.   sodium bicarbonate  650 MG tablet Take 1,300 mg by mouth 2 (two) times daily.   torsemide (DEMADEX) 20 MG tablet Take by mouth. 3 times a  week as needed   No facility-administered encounter medications on file as of 03/15/2024.    Social History: Social History   Tobacco Use   Smoking status: Never   Smokeless tobacco: Never  Vaping Use   Vaping status: Never Used  Substance Use Topics   Alcohol  use: No    Alcohol /week: 0.0 standard drinks of alcohol    Drug use: No    Family Medical History: Family History  Problem Relation Age of Onset   Heart disease Father    Arthritis Other        parent   Hypertension Other        parent   Stroke Other        parent    Physical  Examination: There were no vitals filed for this visit.    Awake, alert, oriented to person, place, and time.  Speech is clear and fluent. Fund of knowledge is appropriate.   Cranial Nerves: Pupils equal round and reactive to light.  Facial tone is symmetric.    Mild central to right sided posterior lumbar tenderness.   Small thoracic rib hump on right.   No abnormal lesions on exposed skin.   Strength: Side Iliopsoas Quads Hamstring PF DF EHL  R 5 5 5 5 5 5   L 5 5 5 5 5 5    Clonus is not present.   Bilateral lower extremity sensation is intact to light touch.     Slow gait and she ambulates with a cane.   Medical Decision Making  Imaging: Lumbar MRI dated 02/17/24:  FINDINGS: Segmentation: 5 lumbar type vertebral bodies as numbered previously.   Alignment: Thoracolumbar curvature convex to the right and lower lumbar curvature convex to the left. 2 mm degenerative anterolisthesis L4-5.   Vertebrae: No fracture or focal bone lesion. No edematous endplate marrow changes or edematous facet arthritis.   Conus medullaris and cauda equina: Conus extends to the L1 level. Conus and cauda equina appear normal.   Paraspinal and other soft tissues: Negative   Disc levels:   No apparent neural compression at T11-12 or T12-L1.   L1-2: Disc degeneration with a left paracentral disc herniation that indents the thecal sac. Stenosis of the left lateral recess could possibly cause left-sided neural compression, particularly affecting the left L2 nerve. This is new since 2022.   L2-3: Disc degeneration more pronounced on the left. Mild narrowing of the lateral recess and foramen but without definite neural compression.   L3-4: Endplate osteophytes and bulging of the disc. Facet degeneration and hypertrophy worse on the right. No compressive stenosis.   L4-5: Bilateral facet osteoarthritis with 2 mm of anterolisthesis. Endplate osteophytes and bulging of the disc. Mild stenosis  of the right lateral recess but without definite neural compression.   L5-S1: Chronic disc degeneration with loss of disc height. Small endplate osteophytes. No central canal stenosis. Mild right foraminal narrowing but without visible compression of the exiting L5 nerve.   IMPRESSION: 1. L1-2: Left paracentral disc herniation indents the thecal sac and could possibly cause left-sided neural compression, particularly affecting the left L2 nerve. This is new since 2022. 2. L2-3: Disc degeneration more pronounced on the left. Mild narrowing of the lateral recess and foramen but without definite neural compression. 3. L3-4: Disc and facet degeneration but without compressive stenosis. 4. L4-5: Facet osteoarthritis with 2 mm of anterolisthesis. Mild stenosis of the right lateral recess but without definite neural compression. No apparent change. 5. L5-S1: Chronic disc degeneration with loss of  disc height. Mild right foraminal narrowing but without visible compression of the exiting L5 nerve. No apparent change.     Electronically Signed   By: Oneil Officer M.D.   On: 02/19/2024 15:10   I have personally reviewed the images and agree with the above interpretation.   Assessment and Plan: Ms. Rosch continues with intermittent right sided LBP with no leg pain. No numbness, tingling, or weakness. Pain is worse with prolonged standing/walking.   She has known scoliosis with diffuse DDD and spondylosis. She has left paracentral disc at L1-L2 with slip at L4-L5, and mild right foraminal stenosis L4-S1.   LBP likely from facets, DDD, and scoliosis.   Treatment options discussed with patient and following plan made:   - Referral back to Dr. Dodson to consider possible lumbar facet injections.  - Discussed PT for her lower back and she declines.  - Will call her in 6-8 weeks to check on her progress.   I spent a total of 20 minutes in face-to-face and non-face-to-face activities  related to this patient's care today including review of outside records, review of imaging, review of symptoms, physical exam, discussion of differential diagnosis, discussion of treatment options, and documentation.   Glade Boys PA-C Dept. of Neurosurgery

## 2024-03-15 ENCOUNTER — Ambulatory Visit: Admitting: Orthopedic Surgery

## 2024-03-15 ENCOUNTER — Encounter: Payer: Self-pay | Admitting: Orthopedic Surgery

## 2024-03-15 VITALS — BP 134/78 | Ht 61.0 in | Wt 125.0 lb

## 2024-03-15 DIAGNOSIS — M4186 Other forms of scoliosis, lumbar region: Secondary | ICD-10-CM

## 2024-03-15 DIAGNOSIS — M5136 Other intervertebral disc degeneration, lumbar region with discogenic back pain only: Secondary | ICD-10-CM

## 2024-03-15 DIAGNOSIS — M5126 Other intervertebral disc displacement, lumbar region: Secondary | ICD-10-CM

## 2024-03-15 DIAGNOSIS — M47816 Spondylosis without myelopathy or radiculopathy, lumbar region: Secondary | ICD-10-CM

## 2024-03-15 DIAGNOSIS — M48061 Spinal stenosis, lumbar region without neurogenic claudication: Secondary | ICD-10-CM

## 2024-03-15 DIAGNOSIS — M4120 Other idiopathic scoliosis, site unspecified: Secondary | ICD-10-CM

## 2024-03-15 NOTE — Patient Instructions (Signed)
 It was so nice to see you today. Thank you so much for coming in.    You have a curve in your lower back (scoliosis) along with wear and tear (arthritis). I think this is causing your back pain.   I want you to see physical medicine and rehab at the [facility] to discuss possible injections in your lower back. Will have you see Dr. Dodson- she will take good care of you. They should call you to schedule an appointment or you can call them at [phone].   I will call you in 6-8 weeks to check on you.   Please do not hesitate to call if you have any questions or concerns. You can also message me in MyChart.   Glade Boys PA-C [phone]     The physicians and staff at [facility] at [facility] are committed to providing excellent care. You may receive a survey asking for feedback about your experience at our office. We value you your feedback and appreciate you taking the time to to fill it out. The [facility] leadership team is also available to discuss your experience in person, feel free to contact us  [phone].

## 2024-03-24 DIAGNOSIS — N2581 Secondary hyperparathyroidism of renal origin: Secondary | ICD-10-CM | POA: Diagnosis not present

## 2024-03-24 DIAGNOSIS — D631 Anemia in chronic kidney disease: Secondary | ICD-10-CM | POA: Diagnosis not present

## 2024-03-24 DIAGNOSIS — E8722 Chronic metabolic acidosis: Secondary | ICD-10-CM | POA: Diagnosis not present

## 2024-03-24 DIAGNOSIS — I10 Essential (primary) hypertension: Secondary | ICD-10-CM | POA: Diagnosis not present

## 2024-03-24 DIAGNOSIS — I129 Hypertensive chronic kidney disease with stage 1 through stage 4 chronic kidney disease, or unspecified chronic kidney disease: Secondary | ICD-10-CM | POA: Diagnosis not present

## 2024-03-24 DIAGNOSIS — N1832 Chronic kidney disease, stage 3b: Secondary | ICD-10-CM | POA: Diagnosis not present

## 2024-03-30 DIAGNOSIS — N2581 Secondary hyperparathyroidism of renal origin: Secondary | ICD-10-CM | POA: Diagnosis not present

## 2024-03-30 DIAGNOSIS — N1832 Chronic kidney disease, stage 3b: Secondary | ICD-10-CM | POA: Diagnosis not present

## 2024-03-30 DIAGNOSIS — I10 Essential (primary) hypertension: Secondary | ICD-10-CM | POA: Diagnosis not present

## 2024-03-30 DIAGNOSIS — D631 Anemia in chronic kidney disease: Secondary | ICD-10-CM | POA: Diagnosis not present

## 2024-03-30 DIAGNOSIS — E8722 Chronic metabolic acidosis: Secondary | ICD-10-CM | POA: Diagnosis not present

## 2024-03-30 DIAGNOSIS — I129 Hypertensive chronic kidney disease with stage 1 through stage 4 chronic kidney disease, or unspecified chronic kidney disease: Secondary | ICD-10-CM | POA: Diagnosis not present

## 2024-04-04 DIAGNOSIS — M810 Age-related osteoporosis without current pathological fracture: Secondary | ICD-10-CM | POA: Diagnosis not present

## 2024-04-12 DIAGNOSIS — M5441 Lumbago with sciatica, right side: Secondary | ICD-10-CM | POA: Diagnosis not present

## 2024-04-12 DIAGNOSIS — G8929 Other chronic pain: Secondary | ICD-10-CM | POA: Diagnosis not present

## 2024-04-12 DIAGNOSIS — M5416 Radiculopathy, lumbar region: Secondary | ICD-10-CM | POA: Diagnosis not present

## 2024-04-26 ENCOUNTER — Telehealth: Payer: Self-pay | Admitting: Orthopedic Surgery

## 2024-04-26 DIAGNOSIS — M150 Primary generalized (osteo)arthritis: Secondary | ICD-10-CM | POA: Diagnosis not present

## 2024-04-26 DIAGNOSIS — M539 Dorsopathy, unspecified: Secondary | ICD-10-CM | POA: Diagnosis not present

## 2024-04-26 NOTE — Telephone Encounter (Addendum)
 Called to check on her. She has seen Dodson and is scheduled for lumbar ESI on 04/28/24.   Spoke to patient. She will reach out to me if she needs anything.

## 2024-04-28 DIAGNOSIS — M5416 Radiculopathy, lumbar region: Secondary | ICD-10-CM | POA: Diagnosis not present

## 2024-05-06 DIAGNOSIS — E039 Hypothyroidism, unspecified: Secondary | ICD-10-CM | POA: Diagnosis not present

## 2024-05-10 DIAGNOSIS — G8929 Other chronic pain: Secondary | ICD-10-CM | POA: Diagnosis not present

## 2024-05-10 DIAGNOSIS — M5416 Radiculopathy, lumbar region: Secondary | ICD-10-CM | POA: Diagnosis not present

## 2024-05-10 DIAGNOSIS — M5441 Lumbago with sciatica, right side: Secondary | ICD-10-CM | POA: Diagnosis not present
